# Patient Record
Sex: Female | Born: 1955 | Race: White | Hispanic: No | Marital: Married | Smoking: Never smoker
Health system: Southern US, Community
[De-identification: ages and names within clinical notes are randomized; demographics above are authoritative.]

## PROBLEM LIST (undated history)

## (undated) DIAGNOSIS — R079 Chest pain, unspecified: Secondary | ICD-10-CM

## (undated) DIAGNOSIS — J45909 Unspecified asthma, uncomplicated: Secondary | ICD-10-CM

## (undated) DIAGNOSIS — J189 Pneumonia, unspecified organism: Secondary | ICD-10-CM

## (undated) DIAGNOSIS — M1990 Unspecified osteoarthritis, unspecified site: Secondary | ICD-10-CM

## (undated) DIAGNOSIS — K219 Gastro-esophageal reflux disease without esophagitis: Secondary | ICD-10-CM

## (undated) DIAGNOSIS — B019 Varicella without complication: Secondary | ICD-10-CM

## (undated) DIAGNOSIS — T7840XA Allergy, unspecified, initial encounter: Secondary | ICD-10-CM

## (undated) DIAGNOSIS — K5792 Diverticulitis of intestine, part unspecified, without perforation or abscess without bleeding: Secondary | ICD-10-CM

## (undated) DIAGNOSIS — T8859XA Other complications of anesthesia, initial encounter: Secondary | ICD-10-CM

## (undated) DIAGNOSIS — F419 Anxiety disorder, unspecified: Secondary | ICD-10-CM

## (undated) HISTORY — DX: Unspecified asthma, uncomplicated: J45.909

## (undated) HISTORY — DX: Unspecified osteoarthritis, unspecified site: M19.90

## (undated) HISTORY — PX: UTERINE FIBROID SURGERY: SHX826

## (undated) HISTORY — PX: LAPAROSCOPY: SHX197

## (undated) HISTORY — DX: Varicella without complication: B01.9

## (undated) HISTORY — DX: Allergy, unspecified, initial encounter: T78.40XA

## (undated) HISTORY — DX: Diverticulitis of intestine, part unspecified, without perforation or abscess without bleeding: K57.92

---

## 1998-02-11 HISTORY — PX: ANTERIOR CRUCIATE LIGAMENT REPAIR: SHX115

## 2002-02-11 HISTORY — PX: ABDOMINAL HYSTERECTOMY: SHX81

## 2007-02-12 DIAGNOSIS — K5792 Diverticulitis of intestine, part unspecified, without perforation or abscess without bleeding: Secondary | ICD-10-CM

## 2007-02-12 HISTORY — DX: Diverticulitis of intestine, part unspecified, without perforation or abscess without bleeding: K57.92

## 2013-07-08 ENCOUNTER — Ambulatory Visit: Payer: Self-pay | Admitting: Internal Medicine

## 2013-09-22 ENCOUNTER — Ambulatory Visit: Payer: Self-pay

## 2014-05-20 LAB — HM PAP SMEAR

## 2014-08-01 ENCOUNTER — Other Ambulatory Visit: Payer: Self-pay | Admitting: Family Medicine

## 2014-08-02 ENCOUNTER — Other Ambulatory Visit: Payer: Self-pay | Admitting: Family Medicine

## 2014-08-02 MED ORDER — ALBUTEROL SULFATE HFA 108 (90 BASE) MCG/ACT IN AERS
2.0000 | INHALATION_SPRAY | RESPIRATORY_TRACT | Status: DC | PRN
Start: 2014-08-02 — End: 2015-03-13

## 2014-08-02 NOTE — Telephone Encounter (Signed)
Pt called stated she needs a refill on Ventolin. [facility] [address] In [place].  Pt is in the pharmacy now and is going out of town today. Needs ASAP. Thanks.

## 2014-08-02 NOTE — Telephone Encounter (Signed)
Routing to provider  

## 2014-08-02 NOTE — Telephone Encounter (Signed)
I just got this message; called patient, left msg that I did not know she was at the pharmacy earlier today waiting on this I will send in to pharmacy; if she has already gone out of town, should be [facility]ferr[facility] any other [facility] In the future, please give [place] 48 hours to allow time to process requests so she isn't inconvenienced

## 2014-11-15 ENCOUNTER — Ambulatory Visit (INDEPENDENT_AMBULATORY_CARE_PROVIDER_SITE_OTHER): Payer: BLUE CROSS/BLUE SHIELD | Admitting: Family Medicine

## 2014-11-15 ENCOUNTER — Encounter: Payer: Self-pay | Admitting: Family Medicine

## 2014-11-15 VITALS — BP 134/88 | HR 72 | Temp 98.0°F | Ht 65.0 in | Wt 161.6 lb

## 2014-11-15 DIAGNOSIS — H6982 Other specified disorders of Eustachian tube, left ear: Secondary | ICD-10-CM

## 2014-11-15 DIAGNOSIS — R030 Elevated blood-pressure reading, without diagnosis of hypertension: Secondary | ICD-10-CM

## 2014-11-15 DIAGNOSIS — IMO0001 Reserved for inherently not codable concepts without codable children: Secondary | ICD-10-CM

## 2014-11-15 DIAGNOSIS — H9202 Otalgia, left ear: Secondary | ICD-10-CM

## 2014-11-15 NOTE — Patient Instructions (Addendum)
Okay to use OTC Afrin Spray then lay then roll as we discussed Twice a day for THREE days only We'll refer you to the ENT doctor Okay to use tylenol or acetaminophen for pain if needed (use per package directions) Do contact your dentist about any problems related to your teeth Try the DASH guidelines  DASH Eating Plan DASH stands for "Dietary Approaches to Stop Hypertension." The DASH eating plan is a healthy eating plan that has been shown to reduce high blood pressure (hypertension). Additional health benefits may include reducing the risk of type 2 diabetes mellitus, heart disease, and stroke. The DASH eating plan may also help with weight loss. WHAT DO I NEED TO KNOW ABOUT THE DASH EATING PLAN? For the DASH eating plan, you will follow these general guidelines:  Choose foods with a percent daily value for sodium of less than 5% (as listed on the food label).  Use salt-free seasonings or herbs instead of table salt or sea salt.  Check with your health care provider or pharmacist before using salt substitutes.  Eat lower-sodium products, often labeled as "lower sodium" or "no salt added."  Eat fresh foods.  Eat more vegetables, fruits, and low-fat dairy products.  Choose whole grains. Look for the word "whole" as the first word in the ingredient list.  Choose fish and skinless chicken or [place] more often than red meat. Limit fish, poultry, and meat to 6 oz (170 g) each day.  Limit sweets, desserts, sugars, and sugary drinks.  Choose heart-healthy fats.  Limit cheese to 1 oz (28 g) per day.  Eat more home-cooked food and less restaurant, buffet, and fast food.  Limit fried foods.  Cook foods using methods other than frying.  Limit canned vegetables. If you do use them, rinse them well to decrease the sodium.  When eating at a restaurant, ask that your food be prepared with less salt, or no salt if possible. WHAT FOODS CAN I EAT? Seek help from a dietitian for  individual calorie needs. Grains Whole grain or whole wheat bread. Brown rice. Whole grain or whole wheat pasta. Quinoa, bulgur, and whole grain cereals. Low-sodium cereals. Corn or whole wheat flour tortillas. Whole grain cornbread. Whole grain crackers. Low-sodium crackers. Vegetables Fresh or frozen vegetables (raw, steamed, roasted, or grilled). Low-sodium or reduced-sodium tomato and vegetable juices. Low-sodium or reduced-sodium tomato sauce and paste. Low-sodium or reduced-sodium canned vegetables.  Fruits All fresh, canned (in natural juice), or frozen fruits. Meat and Other Protein Products Ground beef (85% or leaner), grass-fed beef, or beef trimmed of fat. Skinless chicken or [place]. Ground chicken or [place]. Pork trimmed of fat. All fish and seafood. Eggs. Dried beans, peas, or lentils. Unsalted nuts and seeds. Unsalted canned beans. Dairy Low-fat dairy products, such as skim or 1% milk, 2% or reduced-fat cheeses, low-fat ricotta or cottage cheese, or plain low-fat yogurt. Low-sodium or reduced-sodium cheeses. Fats and Oils Tub margarines without trans fats. Light or reduced-fat mayonnaise and salad dressings (reduced sodium). Avocado. Safflower, olive, or canola oils. Natural peanut or almond butter. Other Unsalted popcorn and pretzels. The items listed above may not be a complete list of recommended foods or beverages. Contact your dietitian for more options. WHAT FOODS ARE NOT RECOMMENDED? Grains White bread. White pasta. White rice. Refined cornbread. Bagels and croissants. Crackers that contain trans fat. Vegetables Creamed or fried vegetables. Vegetables in a cheese sauce. Regular canned vegetables. Regular canned tomato sauce and paste. Regular tomato and vegetable juices. Fruits Dried fruits.  Canned fruit in light or heavy syrup. Fruit juice. Meat and Other Protein Products Fatty cuts of meat. Ribs, chicken wings, bacon, sausage, bologna, salami, chitterlings, fatback, hot  dogs, bratwurst, and packaged luncheon meats. Salted nuts and seeds. Canned beans with salt. Dairy Whole or 2% milk, cream, half-and-half, and cream cheese. Whole-fat or sweetened yogurt. Full-fat cheeses or blue cheese. Nondairy creamers and whipped toppings. Processed cheese, cheese spreads, or cheese curds. Condiments Onion and garlic salt, seasoned salt, table salt, and sea salt. Canned and packaged gravies. Worcestershire sauce. Tartar sauce. Barbecue sauce. Teriyaki sauce. Soy sauce, including reduced sodium. Steak sauce. Fish sauce. Oyster sauce. Cocktail sauce. Horseradish. Ketchup and mustard. Meat flavorings and tenderizers. Bouillon cubes. Hot sauce. Tabasco sauce. Marinades. Taco seasonings. Relishes. Fats and Oils Butter, stick margarine, lard, shortening, ghee, and bacon fat. Coconut, palm kernel, or palm oils. Regular salad dressings. Other Pickles and olives. Salted popcorn and pretzels. The items listed above may not be a complete list of foods and beverages to avoid. Contact your dietitian for more information. WHERE CAN I FIND MORE INFORMATION? National Heart, Lung, and Blood Institute: travelstabloid.com Document Released: 01/17/2011 Document Revised: 06/14/2013 Document Reviewed: 12/02/2012 [facility] Patient Information 2015 [place], [place]. This information is not intended to replace advice given to you by your health care provider. Make sure you discuss any questions you have with your health care provider.

## 2014-11-15 NOTE — Progress Notes (Signed)
BP 134/88 mmHg  Pulse 72  Temp(Src) 98 F (36.7 C)  Ht 5\' 5"  (1.651 m)  Wt 161 lb 9.6 oz (73.301 kg)  BMI 26.89 kg/m2  SpO2 99%   Subjective:    Patient ID: Lynn Peters, female    DOB: Sep 16, 1955, 59 y.o.   MRN: [id number]  HPI: Lynn Peters is a 59 y.o. female  Chief Complaint  Patient presents with  . Ear Pain    Left Ear   She is here for an acute visit for left ear and left sided jaw discomfort; goes down into the left side of the neck No sore throat, dry cough She was on airplanes 10 days ago Since then, feeling pressure in the left ear Feels a little bit like it needs to pop; feels a tiny bit numb Always this left ear; she puts plugs in the ears and take a weak Benadryl, but get excruciating pain in the left ear; almost always just the left Going on for years Checked her temperature yesterday and it was normal Having pain with chewing; jaw feels sore; no clicking in the left ear; no popping in the left ear Having a little bit of headache; thinks form lack of caffeine though; gets headaches if she misses her coffee Has not tried anything; then said she boiled some water and poured it in a towel and having that heat against the side of the ear and neck felt really good Little bit of nasal drainage and that might be causing the cough Asthma has not been any worse thankfully  She is not interested in a flu shot today; previous bad reaction, almost always gets the flu   Relevant past medical, surgical, family and social history reviewed and updated as indicated. Interim medical history since our last visit reviewed. Allergies and medications reviewed and updated.  Review of Systems  Constitutional: Positive for fatigue. Negative for fever.  HENT: Positive for dental problem (posterior molars, two have root canals; dentist is Dr. Thalia Bloodgood), sneezing (when she sneezes, she sneezes multiple times) and voice change (a little hoarse this morning). Negative for facial swelling,  hearing loss, mouth sores, nosebleeds and trouble swallowing.   Skin: Negative for rash.  Psychiatric/Behavioral: Positive for sleep disturbance.   Per HPI unless specifically indicated above     Objective:    BP 134/88 mmHg  Pulse 72  Temp(Src) 98 F (36.7 C)  Ht 5\' 5"  (1.651 m)  Wt 161 lb 9.6 oz (73.301 kg)  BMI 26.89 kg/m2  SpO2 99%  Wt Readings from Last 3 Encounters:  11/15/14 161 lb 9.6 oz (73.301 kg)  05/23/14 157 lb (71.215 kg)   BP was checked by CMA after pushing tight sleeve up  Physical Exam  Constitutional: She appears well-developed and well-nourished.  HENT:  Right Ear: Hearing, tympanic membrane, external ear and ear canal normal. No drainage or swelling. Tympanic membrane is not injected, not scarred and not erythematous. No middle ear effusion. No hemotympanum.  Left Ear: Hearing, tympanic membrane, external ear and ear canal normal. No drainage or swelling. Tympanic membrane is not injected, not scarred and not erythematous.  No middle ear effusion. No hemotympanum.  Nose: No mucosal edema or rhinorrhea.  Mouth/Throat: Oropharynx is clear and moist and mucous membranes are normal.  Eyes: EOM are normal. No scleral icterus.  Cardiovascular: Normal rate and regular rhythm.   Pulmonary/Chest: Effort normal and breath sounds normal.  Lymphadenopathy:       Head (right side): No submental,  no submandibular, no preauricular and no posterior auricular adenopathy present.       Head (left side): No submental, no submandibular, no preauricular and no posterior auricular adenopathy present.    She has no cervical adenopathy.  Psychiatric: She has a normal mood and affect. Her behavior is normal.    Results for orders placed or performed in visit on 11/15/14  HM PAP SMEAR  Result Value Ref Range   HM Pap smear from [facility]       Assessment & Plan:   Problem List Items Addressed This Visit      Nervous and Auditory   Chronic dysfunction of left Eustachian tube -  Primary    Symptoms possibly from ETD; discussed trial of Afrin for 3 days and 3 days only; explained risk of dependence if used longer than 3 days; demonstrated spray-lay-roll to affected side        Other   Otalgia of left ear    Chronic, refer to ENT for evaluation; ddx includes ETD, TMJ; I did not see any signs to suggest dental/oral cause for symptoms      Relevant Orders   Ambulatory referral to ENT   Elevated blood pressure    Encouraged the DASH guidelines         Follow up plan: Return if symptoms worsen or fail to improve. An after-visit summary was printed and given to the patient at [facility].  Please see the patient instructions which may contain other information and recommendations beyond what is mentioned above in the assessment and plan.  Orders Placed This Encounter  Procedures  . Ambulatory referral to ENT

## 2014-11-19 DIAGNOSIS — R030 Elevated blood-pressure reading, without diagnosis of hypertension: Secondary | ICD-10-CM

## 2014-11-19 DIAGNOSIS — IMO0001 Reserved for inherently not codable concepts without codable children: Secondary | ICD-10-CM | POA: Insufficient documentation

## 2014-11-19 NOTE — Assessment & Plan Note (Addendum)
Chronic, refer to ENT for evaluation; ddx includes ETD, TMJ; I did not see any signs to suggest dental/oral cause for symptoms

## 2014-11-19 NOTE — Assessment & Plan Note (Signed)
Symptoms possibly from ETD; discussed trial of Afrin for 3 days and 3 days only; explained risk of dependence if used longer than 3 days; demonstrated spray-lay-roll to affected side

## 2014-11-19 NOTE — Assessment & Plan Note (Signed)
Encouraged the DASH guidelines

## 2015-02-08 ENCOUNTER — Encounter: Payer: Self-pay | Admitting: Nurse Practitioner

## 2015-02-08 ENCOUNTER — Encounter (INDEPENDENT_AMBULATORY_CARE_PROVIDER_SITE_OTHER): Payer: Self-pay

## 2015-02-08 ENCOUNTER — Ambulatory Visit (INDEPENDENT_AMBULATORY_CARE_PROVIDER_SITE_OTHER): Payer: BLUE CROSS/BLUE SHIELD | Admitting: Nurse Practitioner

## 2015-02-08 VITALS — BP 116/78 | HR 76 | Temp 97.4°F | Resp 12 | Ht 65.75 in | Wt 161.2 lb

## 2015-02-08 DIAGNOSIS — Z7189 Other specified counseling: Secondary | ICD-10-CM | POA: Diagnosis not present

## 2015-02-08 DIAGNOSIS — Z1329 Encounter for screening for other suspected endocrine disorder: Secondary | ICD-10-CM

## 2015-02-08 DIAGNOSIS — M25561 Pain in right knee: Secondary | ICD-10-CM | POA: Diagnosis not present

## 2015-02-08 DIAGNOSIS — Z833 Family history of diabetes mellitus: Secondary | ICD-10-CM

## 2015-02-08 DIAGNOSIS — Z7689 Persons encountering health services in other specified circumstances: Secondary | ICD-10-CM

## 2015-02-08 LAB — HEMOGLOBIN A1C: HEMOGLOBIN A1C: 5.6 % (ref 4.6–6.5)

## 2015-02-08 LAB — TSH: TSH: 2.62 u[IU]/mL (ref 0.35–4.50)

## 2015-02-08 LAB — T4, FREE: FREE T4: 1.01 ng/dL (ref 0.60–1.60)

## 2015-02-08 NOTE — Progress Notes (Signed)
Patient ID: Lynn Peters, female    DOB: [birth year]  Age: 59 y.o. MRN: [id number]  CC: Establish Care   HPI Lynn Peters presents for establishing care and chief complaint of diabetes concern.  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   2) Chronic Problems-  Asthma- Stopped singulair 1 year ago    3) Acute Problems-   Concerned about diabetes- no fasting blood tests, Fam. Hx extensive of diabetes.   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   She has past surgical history that includes Anterior cruciate ligament repair (Right, 2000) and Abdominal hysterectomy (2004).   Her family history includes Alcohol abuse in her brother; Alzheimer's disease in her mother; Asthma in her mother and sister; Cancer in her sister; Diabetes in her father, mother, and sister; Heart disease in her brother and father; Hyperlipidemia in her father and mother; Hypertension in her mother; Stroke in her mother.She reports that she has never smoked. She has never used smokeless tobacco. She reports that she drinks alcohol. She reports that she does not use illicit drugs.  Outpatient Prescriptions Prior to Visit  Medication Sig Dispense Refill  . AEROSPAN 80 MCG/ACT AERS USE 4 PUFFS TWICE A DAY AND RINSE MOUTH OUT AFTER USE AS DIRECTED 8.9 Inhaler 3  . albuterol (PROVENTIL HFA;VENTOLIN HFA) 108 (90 BASE) MCG/ACT inhaler Inhale 2 puffs  into the lungs every 4 (four) hours as needed for wheezing or shortness of breath. 1 Inhaler 1  . montelukast (SINGULAIR) 10 MG tablet Take 10 mg by mouth at bedtime. Reported on 02/08/2015     No facility-administered medications prior to visit.    ROS Review of Systems  Constitutional: Positive for fatigue. Negative for fever, chills and diaphoresis.  Eyes: Negative for visual disturbance.  Respiratory: Negative for cough, chest tightness and wheezing.   Gastrointestinal: Negative for nausea, vomiting, diarrhea and constipation.  Endocrine: Negative for cold intolerance, heat intolerance, polydipsia, polyphagia and polyuria.  Musculoskeletal: Positive for arthralgias. Negative for myalgias.  Skin: Negative for rash.  Psychiatric/Behavioral: Negative for suicidal ideas and sleep disturbance. The patient is not nervous/anxious.     Objective:  BP 116/78 mmHg  Pulse 76  Temp(Src) 97.4 F (36.3 C) (Oral)  Resp 12  Ht 5' 5.75" (1.67 m)  Wt 161 lb 4 oz (73.143 kg)  BMI 26.23 kg/m2  SpO2 99%  Physical Exam  Constitutional: She is oriented to person, place, and time. She appears well-developed and well-nourished. No distress.  HENT:  Head: Normocephalic and atraumatic.  Right Ear: External ear normal.  Left Ear: External ear normal.  Cardiovascular: Normal rate, regular rhythm and normal heart sounds.  Exam reveals no gallop and no friction rub.   No murmur heard. Pulmonary/Chest: Effort normal and breath sounds normal. No respiratory distress. She has no wheezes. She has no rales. She exhibits no tenderness.  Neurological: She is alert  and oriented to person, place, and time. No cranial nerve deficit. She exhibits normal muscle tone. Coordination normal.  Skin: Skin is warm and dry. No rash noted. She is not diaphoretic.  Psychiatric: She has a normal mood and affect. Her behavior is normal. Judgment and thought content normal.   Assessment & Plan:   Lynn Peters was seen today for  establish care.  Diagnoses and all orders for this visit:  Family history of diabetes mellitus -     HgB A1c  Screening for thyroid disorder -     TSH -     T4, free  Encounter to establish care  Right knee pain  I am having Lynn Peters maintain her AEROSPAN, albuterol, and montelukast.  No orders of the defined types were placed in this encounter.     Follow-up: Return in about 2 weeks (around 02/22/2015) for Follow up for concerns- EKG at this visit.

## 2015-02-08 NOTE — Patient Instructions (Signed)
Nice to meet you! Welcome to [facility]!   Please visit the lab today and follow up in 2 weeks.

## 2015-02-08 NOTE — Progress Notes (Signed)
Pre-visit discussion using our clinic review tool. No additional management support is needed unless otherwise documented below in the visit note.  

## 2015-02-10 ENCOUNTER — Telehealth: Payer: Self-pay | Admitting: Nurse Practitioner

## 2015-02-10 NOTE — Telephone Encounter (Signed)
Pt called wanting to get her lab results. Please advise pt °

## 2015-02-10 NOTE — Telephone Encounter (Signed)
Please advise on results. Thanks.

## 2015-02-16 DIAGNOSIS — M25561 Pain in right knee: Secondary | ICD-10-CM | POA: Insufficient documentation

## 2015-02-16 DIAGNOSIS — Z833 Family history of diabetes mellitus: Secondary | ICD-10-CM | POA: Insufficient documentation

## 2015-02-16 DIAGNOSIS — Z7689 Persons encountering health services in other specified circumstances: Secondary | ICD-10-CM | POA: Insufficient documentation

## 2015-02-16 DIAGNOSIS — Z1329 Encounter for screening for other suspected endocrine disorder: Secondary | ICD-10-CM | POA: Insufficient documentation

## 2015-02-16 NOTE — Assessment & Plan Note (Signed)
Other family history of diabetes she would like to check her status today. Discussed how the A1c works in the ranges for what it means. Will follow after results

## 2015-02-16 NOTE — Assessment & Plan Note (Signed)
Discussed in 2 weeks right knee feels painful after working out at the gym

## 2015-02-16 NOTE — Assessment & Plan Note (Signed)
Discussed acute and chronic issues. Reviewed health maintenance measures, PFSHx, and immunizations. Obtain records from previous facility and some labs today.  Pt wants work up of cardiac concerns next visit- we will do fasting labs and EKG since she is over 50 to have a baseline and look at cholesterol.

## 2015-02-16 NOTE — Assessment & Plan Note (Signed)
Screening for thyroid disease. Patient reports she is tired most of the time.

## 2015-02-22 ENCOUNTER — Ambulatory Visit (INDEPENDENT_AMBULATORY_CARE_PROVIDER_SITE_OTHER): Payer: BLUE CROSS/BLUE SHIELD | Admitting: Nurse Practitioner

## 2015-02-22 ENCOUNTER — Encounter: Payer: Self-pay | Admitting: Nurse Practitioner

## 2015-02-22 VITALS — BP 118/70 | HR 88 | Temp 97.9°F | Resp 16 | Ht 65.7 in | Wt 163.8 lb

## 2015-02-22 DIAGNOSIS — Z0000 Encounter for general adult medical examination without abnormal findings: Secondary | ICD-10-CM

## 2015-02-22 NOTE — Patient Instructions (Addendum)
Please make an appointment for your labs.  Normal EKG at this time. Will repeat if you have any symptoms.

## 2015-02-22 NOTE — Progress Notes (Signed)
Patient ID: Lynn Peters, female    DOB: 1956-01-05  Age: 60 y.o. MRN: [id number]  CC: Annual Exam   HPI [name] presents for her annual exam.   1) Annual Physical   Diet- Tries to eat healthy   Exercise- 3 times a week   Immunizations-    Flu- UTD  Mammogram- UTD  Colonoscopy- UTD  Eye Exam- UTD  Dental Exam- UTD  LMP- Hysterectomy   Labs- Needs  Fall- Neg.   Depression- Neg.  Refills: No needs today  Needs EKG baseline  Not fasting    History Phala has a past medical history of Arthritis; Allergy; Asthma; Chicken pox; and Diverticulitis (2009).   She has past surgical history that includes Anterior cruciate ligament repair (Right, 2000) and Abdominal hysterectomy (2004).   Her family history includes Alcohol abuse in her brother; Alzheimer's disease in her mother; Asthma in her mother and sister; Cancer in her sister; Diabetes in her father, mother, and sister; Heart disease in her brother and father; Hyperlipidemia in her father and mother; Hypertension in her mother; Stroke in her mother.She reports that she has never smoked. She has never used smokeless tobacco. She reports that she drinks alcohol. She reports that she does not use illicit drugs.  Outpatient Prescriptions Prior to Visit  Medication Sig Dispense Refill  . AEROSPAN 80 MCG/ACT AERS USE 4 PUFFS TWICE A DAY AND RINSE MOUTH OUT AFTER USE AS DIRECTED 8.9 Inhaler 3  . albuterol (PROVENTIL HFA;VENTOLIN HFA) 108 (90 BASE) MCG/ACT inhaler Inhale 2 puffs into the lungs every 4 (four) hours as needed for wheezing or shortness of breath. 1 Inhaler 1  . montelukast (SINGULAIR) 10 MG tablet Take 10 mg by mouth at bedtime. Reported on 02/22/2015     No facility-administered medications prior to visit.    ROS Review of Systems  Constitutional: Negative for fever, chills, diaphoresis, fatigue and unexpected weight change.  HENT: Negative for tinnitus and trouble swallowing.   Eyes: Negative for visual disturbance.   Respiratory: Negative for chest tightness, shortness of breath and wheezing.   Cardiovascular: Negative for chest pain, palpitations and leg swelling.  Gastrointestinal: Negative for nausea, vomiting, abdominal pain, diarrhea, constipation, blood in stool and rectal pain.  Endocrine: Negative for polydipsia, polyphagia and polyuria.  Genitourinary: Negative for dysuria, hematuria, vaginal discharge and vaginal pain.  Musculoskeletal: Negative for myalgias, back pain, arthralgias and gait problem.  Skin: Negative for color change and rash.  Neurological: Negative for dizziness, weakness, numbness and headaches.  Hematological: Does not bruise/bleed easily.  Psychiatric/Behavioral: Negative for suicidal ideas and sleep disturbance. The patient is not nervous/anxious.     Objective:  BP 118/70 mmHg  Pulse 88  Temp(Src) 97.9 F (36.6 C) (Oral)  Resp 16  Ht 5' 5.7" (1.669 m)  Wt 163 lb 12.8 oz (74.299 kg)  BMI 26.67 kg/m2  SpO2 97%  Physical Exam  Constitutional: She is oriented to person, place, and time. She appears well-developed and well-nourished. No distress.  HENT:  Head: Normocephalic and atraumatic.  Right Ear: External ear normal.  Left Ear: External ear normal.  Nose: Nose normal.  Mouth/Throat: Oropharynx is clear and moist. No oropharyngeal exudate.  TMs and canals clear bilaterally  Eyes: Conjunctivae and EOM are normal. Pupils are equal, round, and reactive to light. Right eye exhibits no discharge. Left eye exhibits no discharge. No scleral icterus.  Neck: Normal range of motion. Neck supple. No thyromegaly present.  Cardiovascular: Normal rate, regular rhythm, normal heart sounds and  intact distal pulses.  Exam reveals no gallop and no friction rub.   No murmur heard. Pulmonary/Chest: Effort normal and breath sounds normal. No respiratory distress. She has no wheezes. She has no rales. She exhibits no tenderness.  No significant findings on clinical breast exam   Abdominal: Soft. Bowel sounds are normal. She exhibits no distension and no mass. There is no tenderness. There is no rebound and no guarding.  Genitourinary:  Declined today  Musculoskeletal: Normal range of motion. She exhibits no edema or tenderness.  Lymphadenopathy:    She has no cervical adenopathy.  Neurological: She is alert and oriented to person, place, and time. She has normal reflexes. No cranial nerve deficit. She exhibits normal muscle tone. Coordination normal.  Skin: Skin is warm and dry. No rash noted. She is not diaphoretic. No erythema. No pallor.  Psychiatric: She has a normal mood and affect. Her behavior is normal. Judgment and thought content normal.      Assessment & Plan:   Deannah was seen today for annual exam.  Diagnoses and all orders for this visit:  Preventative health care -     EKG 12-Lead -     Lipid Profile; Future -     CBC w/Diff; Future -     Comp Met (CMET); Future -     Vitamin D (25 hydroxy); Future   I have discontinued Ms. Rabel's montelukast. I am also having her maintain her AEROSPAN and albuterol.  No orders of the defined types were placed in this encounter.     Follow-up: Return for Lab visit for fasting labs.

## 2015-02-28 DIAGNOSIS — Z1211 Encounter for screening for malignant neoplasm of colon: Secondary | ICD-10-CM | POA: Insufficient documentation

## 2015-02-28 DIAGNOSIS — Z0000 Encounter for general adult medical examination without abnormal findings: Secondary | ICD-10-CM | POA: Insufficient documentation

## 2015-02-28 NOTE — Assessment & Plan Note (Signed)
Discussed acute and chronic issues. Reviewed health maintenance measures, PFSHx, and immunizations. Obtain routine labs TSH, Vitamin D CBC w/ diff, A1c, and CMET.   EKG baseline obtained today

## 2015-03-13 ENCOUNTER — Other Ambulatory Visit: Payer: Self-pay

## 2015-03-13 ENCOUNTER — Telehealth: Payer: Self-pay | Admitting: *Deleted

## 2015-03-13 MED ORDER — FLUNISOLIDE HFA 80 MCG/ACT IN AERS: INHALATION_SPRAY | RESPIRATORY_TRACT | Status: DC

## 2015-03-13 MED ORDER — ALBUTEROL SULFATE HFA 108 (90 BASE) MCG/ACT IN AERS: 2.0000 | INHALATION_SPRAY | RESPIRATORY_TRACT | Status: DC | PRN

## 2015-03-13 NOTE — Telephone Encounter (Signed)
Patient will need a medication refill for AEROSPAN, and albuterol.  [facility]  [id number] Contact [phone]

## 2015-03-13 NOTE — Telephone Encounter (Signed)
filled

## 2015-03-30 ENCOUNTER — Other Ambulatory Visit (INDEPENDENT_AMBULATORY_CARE_PROVIDER_SITE_OTHER): Payer: BLUE CROSS/BLUE SHIELD

## 2015-03-30 DIAGNOSIS — Z0000 Encounter for general adult medical examination without abnormal findings: Secondary | ICD-10-CM

## 2015-03-30 LAB — COMPREHENSIVE METABOLIC PANEL
ALT: 22 U/L (ref 0–35)
AST: 19 U/L (ref 0–37)
Albumin: 4.5 g/dL (ref 3.5–5.2)
Alkaline Phosphatase: 69 U/L (ref 39–117)
BUN: 12 mg/dL (ref 6–23)
CHLORIDE: 103 meq/L (ref 96–112)
CO2: 30 meq/L (ref 19–32)
Calcium: 10.1 mg/dL (ref 8.4–10.5)
Creatinine, Ser: 0.83 mg/dL (ref 0.40–1.20)
GFR: 74.52 mL/min (ref >60.00)
GLUCOSE: 99 mg/dL (ref 70–99)
POTASSIUM: 4.1 meq/L (ref 3.5–5.1)
SODIUM: 140 meq/L (ref 135–145)
TOTAL PROTEIN: 7.6 g/dL (ref 6.0–8.3)
Total Bilirubin: 0.6 mg/dL (ref 0.2–1.2)

## 2015-03-30 LAB — CBC WITH DIFFERENTIAL/PLATELET
BASOS PCT: 0.5 % (ref 0.0–3.0)
Basophils Absolute: 0 10*3/uL (ref 0.0–0.1)
EOS PCT: 1.9 % (ref 0.0–5.0)
Eosinophils Absolute: 0.1 10*3/uL (ref 0.0–0.7)
HCT: 47.4 % — ABNORMAL HIGH (ref 36.0–46.0)
Hemoglobin: 16 g/dL — ABNORMAL HIGH (ref 12.0–15.0)
LYMPHS ABS: 2.4 10*3/uL (ref 0.7–4.0)
Lymphocytes Relative: 32.4 % (ref 12.0–46.0)
MCHC: 33.7 g/dL (ref 30.0–36.0)
MCV: 87.8 fl (ref 78.0–100.0)
MONO ABS: 0.5 10*3/uL (ref 0.1–1.0)
MONOS PCT: 6.4 % (ref 3.0–12.0)
NEUTROS ABS: 4.4 10*3/uL (ref 1.4–7.7)
NEUTROS PCT: 58.8 % (ref 43.0–77.0)
PLATELETS: 319 10*3/uL (ref 150.0–400.0)
RBC: 5.4 Mil/uL — AB (ref 3.87–5.11)
RDW: 13.2 % (ref 11.5–15.5)
WBC: 7.5 10*3/uL (ref 4.0–10.5)

## 2015-03-30 LAB — LIPID PANEL
Cholesterol: 240 mg/dL — ABNORMAL HIGH (ref 0–200)
HDL: 65 mg/dL (ref >39.00)
LDL Cholesterol: 149 mg/dL — ABNORMAL HIGH (ref 0–99)
NONHDL: 175.43
TRIGLYCERIDES: 132 mg/dL (ref 0.0–149.0)
Total CHOL/HDL Ratio: 4
VLDL: 26.4 mg/dL (ref 0.0–40.0)

## 2015-03-30 LAB — VITAMIN D 25 HYDROXY (VIT D DEFICIENCY, FRACTURES): VITD: 38.09 ng/mL (ref 30.00–100.00)

## 2015-04-03 ENCOUNTER — Encounter: Payer: Self-pay | Admitting: Nurse Practitioner

## 2015-05-26 ENCOUNTER — Other Ambulatory Visit: Payer: Self-pay | Admitting: Nurse Practitioner

## 2015-10-04 ENCOUNTER — Telehealth: Payer: Self-pay

## 2015-10-04 ENCOUNTER — Ambulatory Visit (INDEPENDENT_AMBULATORY_CARE_PROVIDER_SITE_OTHER): Payer: BLUE CROSS/BLUE SHIELD | Admitting: Family

## 2015-10-04 ENCOUNTER — Encounter: Payer: Self-pay | Admitting: Family

## 2015-10-04 VITALS — BP 110/60 | HR 84 | Temp 97.7°F | Resp 16 | Ht 65.5 in | Wt 167.0 lb

## 2015-10-04 DIAGNOSIS — R140 Abdominal distension (gaseous): Secondary | ICD-10-CM

## 2015-10-04 DIAGNOSIS — Z7189 Other specified counseling: Secondary | ICD-10-CM

## 2015-10-04 DIAGNOSIS — J45909 Unspecified asthma, uncomplicated: Secondary | ICD-10-CM

## 2015-10-04 DIAGNOSIS — R87619 Unspecified abnormal cytological findings in specimens from cervix uteri: Secondary | ICD-10-CM

## 2015-10-04 DIAGNOSIS — J4530 Mild persistent asthma, uncomplicated: Secondary | ICD-10-CM | POA: Insufficient documentation

## 2015-10-04 DIAGNOSIS — Z7689 Persons encountering health services in other specified circumstances: Secondary | ICD-10-CM

## 2015-10-04 MED ORDER — FLUNISOLIDE HFA 80 MCG/ACT IN AERS: INHALATION_SPRAY | RESPIRATORY_TRACT | 3 refills | Status: DC

## 2015-10-04 MED ORDER — ALBUTEROL SULFATE HFA 108 (90 BASE) MCG/ACT IN AERS: INHALATION_SPRAY | RESPIRATORY_TRACT | 2 refills | Status: DC

## 2015-10-04 NOTE — Assessment & Plan Note (Signed)
Will repeat pap at f/u

## 2015-10-04 NOTE — Assessment & Plan Note (Signed)
Occasional abdominal bloating. No acute symptoms. Pending CT abdomen and pelvis. Patient politely declined CA 125 lab test.

## 2015-10-04 NOTE — Patient Instructions (Signed)
I enjoyed meeting you today. I look forward to seeing you at your physical.  Referrals placed. We will call you once we scheduled CT  Abdomen.

## 2015-10-04 NOTE — Telephone Encounter (Signed)
Reordered CT

## 2015-10-04 NOTE — Progress Notes (Signed)
Subjective:    Patient ID: Lynn Peters, female    DOB: 1955/10/22, 60 y.o.   MRN: [id number]  CC: Lynn Peters is a 60 y.o. female who presents today for follow up.   HPI: Patient here for follow-up for chronic disease and to establish care.  She had a physical January of this year.She is also requesting referrals- to allergist and orthopedic.   Asthma- worse with allergies since moving from [place]. Believes wheat and corn are allergic for her.Improves with paddleboating and Zerorunner.   Right knee pain- h/o ACL ligament replacement. Intermittent sharp pain. Unable to run. No giving way, falls, injury.   Some concerns about her Pap smear. She is also due for mammogram.   Pap-positive HPV in 2016; partial hysterectomy with ovaries and cervix per patient. Unable to see Pap smear results or colonoscopy in Epic.  Occasional Abdominal bloating- Has gained some weight which is likely contributing; however concerned about ovarian cancer. Has had CA-125 in the past. Desires CT and lab work. No abdominal pain.   Weight- She is also having trouble losing weight, thinks knee is contributing.    HISTORY:  Past Medical History:  Diagnosis Date  . Allergy   . Arthritis   . Asthma   . Chicken pox   . Diverticulitis 2009   Past Surgical History:  Procedure Laterality Date  . ABDOMINAL HYSTERECTOMY  2004   partial; left cervix and ovaries  . ANTERIOR CRUCIATE LIGAMENT REPAIR Right 2000   Family History  Problem Relation Age of Onset  . Alzheimer's disease Mother   . Asthma Mother   . Diabetes Mother   . Hyperlipidemia Mother   . Hypertension Mother   . Stroke Mother   . Heart disease Father     CHF  . Diabetes Father   . Hyperlipidemia Father   . Alcohol abuse Brother   . Heart disease Brother   . Asthma Sister   . Cancer Sister     uterine  . Diabetes Sister     Allergies: Review of patient's allergies indicates no known allergies. No current outpatient prescriptions on file  prior to visit.   No current facility-administered medications on file prior to visit.     Social History  Substance Use Topics  . Smoking status: Never Smoker  . Smokeless tobacco: Never Used  . Alcohol use 0.0 oz/week    Review of Systems  Constitutional: Negative for chills, fever and unexpected weight change.  HENT: Negative for congestion.   Respiratory: Negative for cough, shortness of breath and wheezing.   Cardiovascular: Negative for chest pain, palpitations and leg swelling.  Gastrointestinal: Positive for abdominal distention. Negative for abdominal pain, blood in stool, nausea and vomiting.  Musculoskeletal: Negative for arthralgias, joint swelling and myalgias.  Skin: Negative for rash.  Neurological: Negative for headaches.  Hematological: Negative for adenopathy.  Psychiatric/Behavioral: Negative for confusion.      Objective:    BP 110/60 (BP Location: Left Arm, Patient Position: Sitting, Cuff Size: Large)   Pulse 84   Temp 97.7 F (36.5 C) (Oral)   Resp 16   Ht 5' 5.5" (1.664 m)   Wt 167 lb (75.8 kg)   SpO2 97%   BMI 27.37 kg/m  BP Readings from Last 3 Encounters:  10/04/15 110/60  02/22/15 118/70  02/08/15 116/78   Wt Readings from Last 3 Encounters:  10/04/15 167 lb (75.8 kg)  02/22/15 163 lb 12.8 oz (74.3 kg)  02/08/15 161 lb 4 oz (73.1  kg)    Physical Exam  Constitutional: She appears well-developed and well-nourished.  Eyes: Conjunctivae are normal.  Cardiovascular: Normal rate, regular rhythm, normal heart sounds and normal pulses.   Pulmonary/Chest: Effort normal and breath sounds normal. She has no wheezes. She has no rhonchi. She has no rales.  Neurological: She is alert.  Skin: Skin is warm and dry.  Psychiatric: She has a normal mood and affect. Her speech is normal and behavior is normal. Thought content normal.  Vitals reviewed.      Assessment & Plan:   Problem List Items Addressed This Visit      Respiratory   Asthma     Worsening with allergies. No acute symptoms today. Referral to allergist as requested. Refilled medications.       Relevant Medications   albuterol (VENTOLIN HFA) 108 (90 Base) MCG/ACT inhaler   Flunisolide HFA (AEROSPAN) 80 MCG/ACT AERS     Other   Encounter to establish care - Primary   Relevant Orders   Ambulatory referral to Orthopedic Surgery   Ambulatory referral to Allergy   MM DIGITAL SCREENING BILATERAL   Abnormal Pap smear of cervix    Will repeat pap at f/u      Abdominal bloating    Occasional abdominal bloating. No acute symptoms. Pending CT abdomen and pelvis. Patient politely declined CA 125 lab test.      Relevant Orders   CT ABDOMEN PELVIS W WO CONTRAST    Other Visit Diagnoses   None.      I have changed Ms. Langham's VENTOLIN HFA to albuterol. I am also having her maintain her Flunisolide HFA.   Meds ordered this encounter  Medications  . albuterol (VENTOLIN HFA) 108 (90 Base) MCG/ACT inhaler    Sig: INHALE 2 PUFFS INTO THE LUNGS EVERY 4 (FOUR) HOURS AS NEEDED FOR WHEEZING OR SHORTNESS OF BREATH.    Dispense:  18 Inhaler    Refill:  2    Order Specific Question:   Supervising Provider    Answer:   Deborra Medina L [[id number]]  . Flunisolide HFA (AEROSPAN) 80 MCG/ACT AERS    Sig: USE 4 PUFFS TWICE A DAY AND RINSE MOUTH OUT AFTER USE AS DIRECTED    Dispense:  8.9 Inhaler    Refill:  3    Order Specific Question:   Supervising Provider    Answer:   Crecencio Mc [[id number]]    Return precautions given.   Risks, benefits, and alternatives of the medications and treatment plan prescribed today were discussed, and patient expressed understanding.   Education regarding symptom management and diagnosis given to patient on AVS.  Continue to follow with Mable Paris, FNP for routine health maintenance.   Lavena Bullion and I agreed with plan.   Mable Paris, FNP

## 2015-10-04 NOTE — Assessment & Plan Note (Signed)
Worsening with allergies. No acute symptoms today. Referral to allergist as requested. Refilled medications.

## 2015-10-05 ENCOUNTER — Telehealth: Payer: Self-pay | Admitting: *Deleted

## 2015-10-05 ENCOUNTER — Ambulatory Visit
Admission: RE | Admit: 2015-10-05 | Discharge: 2015-10-05 | Disposition: A | Discharge status: Home / Self Care | Payer: BLUE CROSS/BLUE SHIELD | Source: Ambulatory Visit | Attending: Family | Admitting: Family

## 2015-10-05 DIAGNOSIS — R140 Abdominal distension (gaseous): Secondary | ICD-10-CM

## 2015-10-05 LAB — POCT I-STAT CREATININE: Creatinine, Ser: 0.8 mg/dL (ref 0.44–1.00)

## 2015-10-05 MED ORDER — IOPAMIDOL (ISOVUE-300) INJECTION 61%
100.0000 mL | Freq: Once | INTRAVENOUS | Status: AC | PRN
  Administered 2015-10-05: 100 mL via INTRAVENOUS

## 2015-10-05 NOTE — Telephone Encounter (Signed)
-----   Message from Burnard Hawthorne, [place] sent at 10/05/2015 12:15 PM EDT ----- Please let patient know that recent tests were normal - I sent to Mychart as well.   If unable to reach patient after 3 attempts, please let me know.   NOTE: If MyChart activated, I have also released them to MyChart.   Thanks  :)

## 2015-10-05 NOTE — Telephone Encounter (Signed)
Patient advised as below.  

## 2015-10-05 NOTE — Telephone Encounter (Signed)
Patient has requested her CT scan results  Pt contact [phone]

## 2015-10-05 NOTE — Telephone Encounter (Signed)
Patient has been informed that results have not been interpreted by Mable Paris as of yet. Will call patient when they have been.

## 2015-10-06 ENCOUNTER — Encounter: Payer: Self-pay | Admitting: Family

## 2015-10-10 ENCOUNTER — Encounter: Payer: Self-pay | Admitting: Family

## 2015-10-10 DIAGNOSIS — M25561 Pain in right knee: Secondary | ICD-10-CM

## 2015-10-26 ENCOUNTER — Ambulatory Visit
Admission: RE | Admit: 2015-10-26 | Discharge: 2015-10-26 | Disposition: A | Discharge status: Home / Self Care | Payer: BLUE CROSS/BLUE SHIELD | Source: Ambulatory Visit | Attending: Family | Admitting: Family

## 2015-10-26 ENCOUNTER — Telehealth: Payer: Self-pay | Admitting: *Deleted

## 2015-10-26 ENCOUNTER — Other Ambulatory Visit: Payer: Self-pay | Admitting: Family

## 2015-10-26 DIAGNOSIS — Z1231 Encounter for screening mammogram for malignant neoplasm of breast: Secondary | ICD-10-CM | POA: Diagnosis not present

## 2015-10-26 DIAGNOSIS — Z7689 Persons encountering health services in other specified circumstances: Secondary | ICD-10-CM

## 2015-10-26 NOTE — Telephone Encounter (Signed)
Sent you a result note.

## 2015-10-26 NOTE — Telephone Encounter (Signed)
Patient has requested to have her mammogram results as soon as soon as results are available  Pt contact [phone]

## 2015-10-26 NOTE — Telephone Encounter (Signed)
Test was done today, no result in chart, please notify patient when they are there thanks  Sent to [facility] to hold for results when available, thanks

## 2015-10-26 NOTE — Telephone Encounter (Signed)
Patients results have return back incomplete.  Needed more imaging.Please advise.

## 2015-10-27 ENCOUNTER — Ambulatory Visit
Admission: RE | Admit: 2015-10-27 | Discharge: 2015-10-27 | Disposition: A | Discharge status: Home / Self Care | Payer: BLUE CROSS/BLUE SHIELD | Source: Ambulatory Visit | Attending: Family | Admitting: Family

## 2015-10-27 ENCOUNTER — Other Ambulatory Visit: Payer: Self-pay | Admitting: Family

## 2015-10-27 DIAGNOSIS — N6489 Other specified disorders of breast: Secondary | ICD-10-CM | POA: Diagnosis present

## 2015-10-27 DIAGNOSIS — N63 Unspecified lump in breast: Secondary | ICD-10-CM | POA: Diagnosis not present

## 2015-10-28 ENCOUNTER — Encounter: Payer: Self-pay | Admitting: Family

## 2015-10-28 DIAGNOSIS — N6320 Unspecified lump in the left breast, unspecified quadrant: Secondary | ICD-10-CM | POA: Insufficient documentation

## 2015-12-04 DIAGNOSIS — M25561 Pain in right knee: Secondary | ICD-10-CM | POA: Diagnosis not present

## 2015-12-04 DIAGNOSIS — M2341 Loose body in knee, right knee: Secondary | ICD-10-CM | POA: Diagnosis not present

## 2015-12-04 DIAGNOSIS — M1711 Unilateral primary osteoarthritis, right knee: Secondary | ICD-10-CM | POA: Diagnosis not present

## 2015-12-05 ENCOUNTER — Other Ambulatory Visit: Payer: Self-pay | Admitting: Orthopedic Surgery

## 2015-12-05 DIAGNOSIS — M25561 Pain in right knee: Secondary | ICD-10-CM

## 2015-12-05 DIAGNOSIS — M2341 Loose body in knee, right knee: Secondary | ICD-10-CM

## 2015-12-05 DIAGNOSIS — J3089 Other allergic rhinitis: Secondary | ICD-10-CM | POA: Diagnosis not present

## 2015-12-05 DIAGNOSIS — J4540 Moderate persistent asthma, uncomplicated: Secondary | ICD-10-CM | POA: Diagnosis not present

## 2015-12-05 DIAGNOSIS — J301 Allergic rhinitis due to pollen: Secondary | ICD-10-CM | POA: Diagnosis not present

## 2015-12-05 DIAGNOSIS — J3081 Allergic rhinitis due to animal (cat) (dog) hair and dander: Secondary | ICD-10-CM | POA: Diagnosis not present

## 2015-12-08 ENCOUNTER — Ambulatory Visit
Admission: RE | Admit: 2015-12-08 | Discharge: 2015-12-08 | Disposition: A | Discharge status: Home / Self Care | Payer: BLUE CROSS/BLUE SHIELD | Source: Ambulatory Visit | Attending: Orthopedic Surgery | Admitting: Orthopedic Surgery

## 2015-12-08 DIAGNOSIS — M25561 Pain in right knee: Secondary | ICD-10-CM

## 2015-12-08 DIAGNOSIS — M2341 Loose body in knee, right knee: Secondary | ICD-10-CM | POA: Insufficient documentation

## 2015-12-08 DIAGNOSIS — M19071 Primary osteoarthritis, right ankle and foot: Secondary | ICD-10-CM | POA: Diagnosis not present

## 2016-01-08 ENCOUNTER — Ambulatory Visit (INDEPENDENT_AMBULATORY_CARE_PROVIDER_SITE_OTHER): Payer: BLUE CROSS/BLUE SHIELD | Admitting: Family

## 2016-01-08 ENCOUNTER — Encounter: Payer: Self-pay | Admitting: Family

## 2016-01-08 ENCOUNTER — Other Ambulatory Visit (HOSPITAL_COMMUNITY)
Admission: RE | Admit: 2016-01-08 | Discharge: 2016-01-08 | Disposition: A | Discharge status: Home / Self Care | Payer: BLUE CROSS/BLUE SHIELD | Source: Ambulatory Visit | Attending: Family | Admitting: Family

## 2016-01-08 VITALS — BP 118/78 | HR 74 | Temp 97.5°F | Ht 65.0 in | Wt 169.2 lb

## 2016-01-08 DIAGNOSIS — R87619 Unspecified abnormal cytological findings in specimens from cervix uteri: Secondary | ICD-10-CM | POA: Diagnosis not present

## 2016-01-08 DIAGNOSIS — J45909 Unspecified asthma, uncomplicated: Secondary | ICD-10-CM

## 2016-01-08 DIAGNOSIS — Z0000 Encounter for general adult medical examination without abnormal findings: Secondary | ICD-10-CM | POA: Diagnosis not present

## 2016-01-08 DIAGNOSIS — N6320 Unspecified lump in the left breast, unspecified quadrant: Secondary | ICD-10-CM | POA: Diagnosis not present

## 2016-01-08 DIAGNOSIS — Z01419 Encounter for gynecological examination (general) (routine) without abnormal findings: Secondary | ICD-10-CM | POA: Insufficient documentation

## 2016-01-08 DIAGNOSIS — Z1151 Encounter for screening for human papillomavirus (HPV): Secondary | ICD-10-CM | POA: Insufficient documentation

## 2016-01-08 DIAGNOSIS — Z113 Encounter for screening for infections with a predominantly sexual mode of transmission: Secondary | ICD-10-CM | POA: Diagnosis not present

## 2016-01-08 LAB — LIPID PANEL
CHOLESTEROL: 255 mg/dL — AB (ref 0–200)
HDL: 69.8 mg/dL (ref >39.00)
LDL CALC: 160 mg/dL — AB (ref 0–99)
NonHDL: 185.2
TRIGLYCERIDES: 126 mg/dL (ref 0.0–149.0)
Total CHOL/HDL Ratio: 4
VLDL: 25.2 mg/dL (ref 0.0–40.0)

## 2016-01-08 NOTE — Patient Instructions (Addendum)
You are doing great Labs Mammogram repeat 03/2016.  Health Maintenance, Female Introduction Adopting a healthy lifestyle and getting preventive care can go a long way to promote health and wellness. Talk with your health care provider about what schedule of regular examinations is right for you. This is a good chance for you to check in with your provider about disease prevention and staying healthy. In between checkups, there are plenty of things you can do on your own. Experts have done a lot of research about which lifestyle changes and preventive measures are most likely to keep you healthy. Ask your health care provider for more information. Weight and diet Eat a healthy diet  Be sure to include plenty of vegetables, fruits, low-fat dairy products, and lean protein.  Do not eat a lot of foods high in solid fats, added sugars, or salt.  Get regular exercise. This is one of the most important things you can do for your health.  Most adults should exercise for at least 150 minutes each week. The exercise should increase your heart rate and make you sweat (moderate-intensity exercise).  Most adults should also do strengthening exercises at least twice a week. This is in addition to the moderate-intensity exercise. Maintain a healthy weight  Body mass index (BMI) is a measurement that can be used to identify possible weight problems. It estimates body fat based on height and weight. Your health care provider can help determine your BMI and help you achieve or maintain a healthy weight.  For females 60 years of age and older:  A BMI below 18.5 is considered underweight.  A BMI of 18.5 to 24.9 is normal.  A BMI of 25 to 29.9 is considered overweight.  A BMI of 30 and above is considered obese. Watch levels of cholesterol and blood lipids  You should start having your blood tested for lipids and cholesterol at 60 years of age, then have this test every 5 years.  You may need to have  your cholesterol levels checked more often if:  Your lipid or cholesterol levels are high.  You are older than 60 years of age.  You are at high risk for heart disease. Cancer screening Lung Cancer  Lung cancer screening is recommended for adults 50-37 years old who are at high risk for lung cancer because of a history of smoking.  A yearly low-dose CT scan of the lungs is recommended for people who:  Currently smoke.  Have quit within the past 15 years.  Have at least a 30-pack-year history of smoking. A pack year is smoking an average of one pack of cigarettes a day for 1 year.  Yearly screening should continue until it has been 15 years since you quit.  Yearly screening should stop if you develop a health problem that would prevent you from having lung cancer treatment. Breast Cancer  Practice breast self-awareness. This means understanding how your breasts normally appear and feel.  It also means doing regular breast self-exams. Let your health care provider know about any changes, no matter how small.  If you are in your 20s or 30s, you should have a clinical breast exam (CBE) by a health care provider every 1-3 years as part of a regular health exam.  If you are 8 or older, have a CBE every year. Also consider having a breast X-ray (mammogram) every year.  If you have a family history of breast cancer, talk to your health care provider about genetic screening.  If you are at high risk for breast cancer, talk to your health care provider about having an MRI and a mammogram every year.  Breast cancer gene (BRCA) assessment is recommended for women who have family members with BRCA-related cancers. BRCA-related cancers include:  Breast.  Ovarian.  Tubal.  Peritoneal cancers.  Results of the assessment will determine the need for genetic counseling and BRCA1 and BRCA2 testing. Cervical Cancer  Your health care provider may recommend that you be screened regularly  for cancer of the pelvic organs (ovaries, uterus, and vagina). This screening involves a pelvic examination, including checking for microscopic changes to the surface of your cervix (Pap test). You may be encouraged to have this screening done every 3 years, beginning at age 32.  For women ages 45-65, health care providers may recommend pelvic exams and Pap testing every 3 years, or they may recommend the Pap and pelvic exam, combined with testing for human papilloma virus (HPV), every 5 years. Some types of HPV increase your risk of cervical cancer. Testing for HPV may also be done on women of any age with unclear Pap test results.  Other health care providers may not recommend any screening for nonpregnant women who are considered low risk for pelvic cancer and who do not have symptoms. Ask your health care provider if a screening pelvic exam is right for you.  If you have had past treatment for cervical cancer or a condition that could lead to cancer, you need Pap tests and screening for cancer for at least 20 years after your treatment. If Pap tests have been discontinued, your risk factors (such as having a new sexual partner) need to be reassessed to determine if screening should resume. Some women have medical problems that increase the chance of getting cervical cancer. In these cases, your health care provider may recommend more frequent screening and Pap tests. Colorectal Cancer  This type of cancer can be detected and often prevented.  Routine colorectal cancer screening usually begins at 60 years of age and continues through 60 years of age.  Your health care provider may recommend screening at an earlier age if you have risk factors for colon cancer.  Your health care provider may also recommend using home test kits to check for hidden blood in the stool.  A small camera at the end of a tube can be used to examine your colon directly (sigmoidoscopy or colonoscopy). This is done to check  for the earliest forms of colorectal cancer.  Routine screening usually begins at age 52.  Direct examination of the colon should be repeated every 5-10 years through 60 years of age. However, you may need to be screened more often if early forms of precancerous polyps or small growths are found. Skin Cancer  Check your skin from head to toe regularly.  Tell your health care provider about any new moles or changes in moles, especially if there is a change in a mole's shape or color.  Also tell your health care provider if you have a mole that is larger than the size of a pencil eraser.  Always use sunscreen. Apply sunscreen liberally and repeatedly throughout the day.  Protect yourself by wearing long sleeves, pants, a wide-brimmed hat, and sunglasses whenever you are outside. Heart disease, diabetes, and high blood pressure  High blood pressure causes heart disease and increases the risk of stroke. High blood pressure is more likely to develop in:  People who have blood pressure in the  high end of the normal range (130-139/85-89 mm Hg).  People who are overweight or obese.  People who are African American.  If you are 18-39 years of age, have your blood pressure checked every 3-5 years. If you are 40 years of age or older, have your blood pressure checked every year. You should have your blood pressure measured twice-once when you are at a hospital or clinic, and once when you are not at a hospital or clinic. Record the average of the two measurements. To check your blood pressure when you are not at a hospital or clinic, you can use:  An automated blood pressure machine at a pharmacy.  A home blood pressure monitor.  If you are between 55 years and 79 years old, ask your health care provider if you should take aspirin to prevent strokes.  Have regular diabetes screenings. This involves taking a blood sample to check your fasting blood sugar level.  If you are at a normal weight  and have a low risk for diabetes, have this test once every three years after 60 years of age.  If you are overweight and have a high risk for diabetes, consider being tested at a younger age or more often. Preventing infection Hepatitis B  If you have a higher risk for hepatitis B, you should be screened for this virus. You are considered at high risk for hepatitis B if:  You were born in a country where hepatitis B is common. Ask your health care provider which countries are considered high risk.  Your parents were born in a high-risk country, and you have not been immunized against hepatitis B (hepatitis B vaccine).  You have HIV or AIDS.  You use needles to inject street drugs.  You live with someone who has hepatitis B.  You have had sex with someone who has hepatitis B.  You get hemodialysis treatment.  You take certain medicines for conditions, including cancer, organ transplantation, and autoimmune conditions. Hepatitis C  Blood testing is recommended for:  Everyone born from 1945 through 1965.  Anyone with known risk factors for hepatitis C. Sexually transmitted infections (STIs)  You should be screened for sexually transmitted infections (STIs) including gonorrhea and chlamydia if:  You are sexually active and are younger than 60 years of age.  You are older than 60 years of age and your health care provider tells you that you are at risk for this type of infection.  Your sexual activity has changed since you were last screened and you are at an increased risk for chlamydia or gonorrhea. Ask your health care provider if you are at risk.  If you do not have HIV, but are at risk, it may be recommended that you take a prescription medicine daily to prevent HIV infection. This is called pre-exposure prophylaxis (PrEP). You are considered at risk if:  You are sexually active and do not regularly use condoms or know the HIV status of your partner(s).  You take drugs by  injection.  You are sexually active with a partner who has HIV. Talk with your health care provider about whether you are at high risk of being infected with HIV. If you choose to begin PrEP, you should first be tested for HIV. You should then be tested every 3 months for as long as you are taking PrEP. Pregnancy  If you are premenopausal and you may become pregnant, ask your health care provider about preconception counseling.  If you may become   pregnant, take 400 to 800 micrograms (mcg) of folic acid every day.  If you want to prevent pregnancy, talk to your health care provider about birth control (contraception). Osteoporosis and menopause  Osteoporosis is a disease in which the bones lose minerals and strength with aging. This can result in serious bone fractures. Your risk for osteoporosis can be identified using a bone density scan.  If you are 26 years of age or older, or if you are at risk for osteoporosis and fractures, ask your health care provider if you should be screened.  Ask your health care provider whether you should take a calcium or vitamin D supplement to lower your risk for osteoporosis.  Menopause may have certain physical symptoms and risks.  Hormone replacement therapy may reduce some of these symptoms and risks. Talk to your health care provider about whether hormone replacement therapy is right for you. Follow these instructions at home:  Schedule regular health, dental, and eye exams.  Stay current with your immunizations.  Do not use any tobacco products including cigarettes, chewing tobacco, or electronic cigarettes.  If you are pregnant, do not drink alcohol.  If you are breastfeeding, limit how much and how often you drink alcohol.  Limit alcohol intake to no more than 1 drink per day for nonpregnant women. One drink equals 12 ounces of beer, 5 ounces of wine, or 1 ounces of hard liquor.  Do not use street drugs.  Do not share needles.  Ask  your health care provider for help if you need support or information about quitting drugs.  Tell your health care provider if you often feel depressed.  Tell your health care provider if you have ever been abused or do not feel safe at home. This information is not intended to replace advice given to you by your health care provider. Make sure you discuss any questions you have with your health care provider. Document Released: 08/13/2010 Document Revised: 07/06/2015 Document Reviewed: 11/01/2014  2017 [employer]

## 2016-01-08 NOTE — Progress Notes (Signed)
Pre visit review using our clinic review tool, if applicable. No additional management support is needed unless otherwise documented below in the visit note. 

## 2016-01-08 NOTE — Assessment & Plan Note (Signed)
Continue to follow with allergy. Symptomatically stable today. We'll follow.

## 2016-01-08 NOTE — Assessment & Plan Note (Addendum)
Pending Lipid panel, hep C screen.

## 2016-01-08 NOTE — Progress Notes (Signed)
Subjective:    Patient ID: Lynn Peters, female    DOB: 06/05/1955, 60 y.o.   MRN: [id number]  CC: Lynn Peters is a 60 y.o. female who presents today for physical exam.    HPI: Here for preventative care, Pap smear, and  follow up.   CPE done 02/2015 and here today for Pap since h./o abnormal.   Asthma- follows with allergy. On QVar. Had been on Brio but had heart palpitations and stopped medication. No SOB, wheezing, nocturnal coughing.      Colorectal Cancer Screening:  Unable to see record. Believes 2009 and wants to hold off to 2019. No polpys per patient.  Breast Cancer Screening: Mammogram UTD; likely benign left breast mass; advised 6 month f/u.  Cervical Cancer Screening: H/o hysterectomy. Had presence of HPV.  Bone Health screening/DEXA for 65+: No increased fracture risk. Defer screening at this time. Lung Cancer Screening: Doesn't have 30 year pack year history and age > 46 years.  Immunizations       Tetanus - Due; advised to get at local pharmacy Hepatitis C screening - Candidate for.   Labs: Screening labs today. Exercise: Gets regular exercise.  Alcohol use: Occasional.  Smoking/tobacco use: Nonsmoker.  Regular dental exams: UTD Wears seat belt: Yes. Eye: UTD Skin:   HISTORY:  Past Medical History:  Diagnosis Date  . Allergy   . Arthritis   . Asthma   . Chicken pox   . Diverticulitis 2009    Past Surgical History:  Procedure Laterality Date  . ABDOMINAL HYSTERECTOMY  2004   partial; left cervix and ovaries  . ANTERIOR CRUCIATE LIGAMENT REPAIR Right 2000   Family History  Problem Relation Age of Onset  . Alzheimer's disease Mother   . Asthma Mother   . Diabetes Mother   . Hyperlipidemia Mother   . Hypertension Mother   . Stroke Mother   . Heart disease Father     CHF  . Diabetes Father   . Hyperlipidemia Father   . Alcohol abuse Brother   . Heart disease Brother   . Asthma Sister   . Cancer Sister     uterine  . Diabetes Sister   .  Breast cancer Neg Hx       ALLERGIES: Patient has no known allergies.  Current Outpatient Prescriptions on File Prior to Visit  Medication Sig Dispense Refill  . albuterol (VENTOLIN HFA) 108 (90 Base) MCG/ACT inhaler INHALE 2 PUFFS INTO THE LUNGS EVERY 4 (FOUR) HOURS AS NEEDED FOR WHEEZING OR SHORTNESS OF BREATH. 18 Inhaler 2   No current facility-administered medications on file prior to visit.     Social History  Substance Use Topics  . Smoking status: Never Smoker  . Smokeless tobacco: Never Used  . Alcohol use 0.0 oz/week    Review of Systems  Constitutional: Negative for chills, fever and unexpected weight change.  HENT: Negative for congestion.   Respiratory: Negative for cough.   Cardiovascular: Negative for chest pain, palpitations and leg swelling.  Gastrointestinal: Negative for nausea and vomiting.  Musculoskeletal: Negative for myalgias.  Skin: Negative for rash.  Neurological: Negative for headaches.  Hematological: Negative for adenopathy.  Psychiatric/Behavioral: Negative for confusion.      Objective:    BP 118/78   Pulse 74   Temp 97.5 F (36.4 C) (Oral)   Ht 5\' 5"  (1.651 m)   Wt 169 lb 3.2 oz (76.7 kg)   SpO2 98%   BMI 28.16 kg/m   BP Readings  from Last 3 Encounters:  01/08/16 118/78  10/04/15 110/60  02/22/15 118/70   Wt Readings from Last 3 Encounters:  01/08/16 169 lb 3.2 oz (76.7 kg)  10/04/15 167 lb (75.8 kg)  02/22/15 163 lb 12.8 oz (74.3 kg)    Physical Exam  Constitutional: She appears well-developed and well-nourished.  Eyes: Conjunctivae are normal.  Neck: No thyroid mass and no thyromegaly present.  Cardiovascular: Normal rate, regular rhythm, normal heart sounds and normal pulses.   Pulmonary/Chest: Effort normal and breath sounds normal. She has no wheezes. She has no rhonchi. She has no rales. Right breast exhibits no inverted nipple, no mass, no nipple discharge, no skin change and no tenderness. Left breast exhibits no  inverted nipple, no mass, no nipple discharge, no skin change and no tenderness. Breasts are symmetrical.  No masses or asymmetry appreciated during CBE.  Genitourinary: Uterus is not enlarged, not fixed and not tender. Cervix exhibits no motion tenderness, no discharge and no friability. Right adnexum displays no mass, no tenderness and no fullness. Left adnexum displays no mass, no tenderness and no fullness.  Genitourinary Comments: Pap performed. No CMT. Unable to appreciated ovaries.  Lymphadenopathy:       Head (right side): No submental, no submandibular, no tonsillar, no preauricular, no posterior auricular and no occipital adenopathy present.       Head (left side): No submental, no submandibular, no tonsillar, no preauricular, no posterior auricular and no occipital adenopathy present.       Right cervical: No superficial cervical, no deep cervical and no posterior cervical adenopathy present.      Left cervical: No superficial cervical, no deep cervical and no posterior cervical adenopathy present.    She has no axillary adenopathy.       Right axillary: No pectoral and no lateral adenopathy present.       Left axillary: No pectoral and no lateral adenopathy present. Neurological: She is alert.  Skin: Skin is warm and dry.  Psychiatric: She has a normal mood and affect. Her speech is normal and behavior is normal. Thought content normal.  Vitals reviewed.      Assessment & Plan:   Problem List Items Addressed This Visit      Respiratory   Asthma    Continue to follow with allergy. Symptomatically stable today. We'll follow.      Relevant Medications   montelukast (SINGULAIR) 10 MG tablet   QVAR 80 MCG/ACT inhaler     Other   Preventative health care    Pending Lipid panel, hep C screen.       Relevant Orders   Hepatitis C antibody   Lipid panel   Abnormal Pap smear of cervix    History of abnormal. Pap done today. Wet prep pending as well.       Relevant Orders     Cytology - PAP   Cervicovaginal ancillary only   Cervicovaginal ancillary only    Other Visit Diagnoses    Left breast mass    -  Primary   Relevant Orders   MM Digital Diagnostic Unilat L       I have discontinued Ms. Violette's Flunisolide HFA and BREO ELLIPTA. I am also having her maintain her albuterol, montelukast, and QVAR.   Meds ordered this encounter  Medications  . montelukast (SINGULAIR) 10 MG tablet  . DISCONTD: BREO ELLIPTA 100-25 MCG/INH AEPB  . QVAR 80 MCG/ACT inhaler    Return precautions given.   Risks, benefits, and alternatives  of the medications and treatment plan prescribed today were discussed, and patient expressed understanding.   Education regarding symptom management and diagnosis given to patient on AVS.   Continue to follow with Mable Paris, FNP for routine health maintenance.   Lavena Bullion and I agreed with plan.   Mable Paris, FNP

## 2016-01-08 NOTE — Assessment & Plan Note (Signed)
History of abnormal. Pap done today. Wet prep pending as well.

## 2016-01-09 LAB — CERVICOVAGINAL ANCILLARY ONLY: Wet Prep (BD Affirm): POSITIVE — AB

## 2016-01-09 LAB — HEPATITIS C ANTIBODY: HCV Ab: NEGATIVE

## 2016-01-10 LAB — CYTOLOGY - PAP
Diagnosis: NEGATIVE
HPV (WINDOPATH): NOT DETECTED

## 2016-01-11 ENCOUNTER — Other Ambulatory Visit: Payer: Self-pay | Admitting: Family

## 2016-01-11 ENCOUNTER — Telehealth: Payer: Self-pay | Admitting: Family

## 2016-01-11 ENCOUNTER — Encounter: Payer: Self-pay | Admitting: Family

## 2016-01-11 DIAGNOSIS — B9689 Other specified bacterial agents as the cause of diseases classified elsewhere: Secondary | ICD-10-CM

## 2016-01-11 DIAGNOSIS — N760 Acute vaginitis: Principal | ICD-10-CM

## 2016-01-11 MED ORDER — METRONIDAZOLE 500 MG PO TABS: 500.0000 mg | ORAL_TABLET | Freq: Two times a day (BID) | ORAL | 0 refills | Status: DC

## 2016-01-11 NOTE — Telephone Encounter (Signed)
Please advise 

## 2016-01-11 NOTE — Telephone Encounter (Signed)
Unfortunately I would not recommend that. Infection treated better with oral.  Not sure if they have gel

## 2016-01-11 NOTE — Telephone Encounter (Signed)
Pt called wanting to know if she can get a gel instead of a tablet for metroNIDAZOLE (FLAGYL) 500 MG tablet?  Please advise? Call pt @ [phone].  Pharmacy is [id number] - [place], [place]. Thank you!

## 2016-01-11 NOTE — Telephone Encounter (Signed)
Mychart message has been sent.  

## 2016-01-21 ENCOUNTER — Encounter: Payer: Self-pay | Admitting: Family

## 2016-01-22 ENCOUNTER — Telehealth: Payer: Self-pay | Admitting: Family

## 2016-01-22 ENCOUNTER — Other Ambulatory Visit: Payer: Self-pay | Admitting: Family

## 2016-01-22 DIAGNOSIS — B9689 Other specified bacterial agents as the cause of diseases classified elsewhere: Secondary | ICD-10-CM

## 2016-01-22 DIAGNOSIS — N760 Acute vaginitis: Principal | ICD-10-CM

## 2016-01-22 MED ORDER — METRONIDAZOLE 0.75 % VA GEL: 1.0000 | Freq: Every day | VAGINAL | 0 refills | Status: DC

## 2016-01-22 NOTE — Telephone Encounter (Signed)
See my chart message

## 2016-01-24 ENCOUNTER — Telehealth: Payer: Self-pay | Admitting: Family

## 2016-01-24 DIAGNOSIS — B9689 Other specified bacterial agents as the cause of diseases classified elsewhere: Secondary | ICD-10-CM

## 2016-01-24 DIAGNOSIS — N760 Acute vaginitis: Principal | ICD-10-CM

## 2016-01-24 MED ORDER — METRONIDAZOLE 0.75 % VA GEL: 1.0000 | Freq: Every day | VAGINAL | 0 refills | Status: AC

## 2016-01-24 NOTE — Telephone Encounter (Signed)
Patient has been notified

## 2016-01-24 NOTE — Telephone Encounter (Signed)
Call pt-  Are her symptoms not better after 5 days of metrogel?  Usually bacterial vaginosis responds in 5 days.Marland KitchenMarland Kitchen   I will refill and she can use for a couple more days however if she still has sxs, she needs OV

## 2016-01-24 NOTE — Telephone Encounter (Signed)
Left message for patient to return call.

## 2016-02-08 ENCOUNTER — Encounter: Payer: Self-pay | Admitting: Family

## 2016-02-13 ENCOUNTER — Other Ambulatory Visit (HOSPITAL_COMMUNITY)
Admission: RE | Admit: 2016-02-13 | Discharge: 2016-02-13 | Disposition: A | Discharge status: Home / Self Care | Payer: BLUE CROSS/BLUE SHIELD | Source: Ambulatory Visit | Attending: Family | Admitting: Family

## 2016-02-13 ENCOUNTER — Encounter: Payer: Self-pay | Admitting: Family

## 2016-02-13 ENCOUNTER — Ambulatory Visit (INDEPENDENT_AMBULATORY_CARE_PROVIDER_SITE_OTHER): Payer: BLUE CROSS/BLUE SHIELD | Admitting: Family

## 2016-02-13 ENCOUNTER — Other Ambulatory Visit: Payer: BLUE CROSS/BLUE SHIELD

## 2016-02-13 VITALS — BP 124/88 | HR 89 | Temp 97.7°F | Wt 171.8 lb

## 2016-02-13 DIAGNOSIS — R300 Dysuria: Secondary | ICD-10-CM | POA: Diagnosis not present

## 2016-02-13 DIAGNOSIS — R635 Abnormal weight gain: Secondary | ICD-10-CM | POA: Diagnosis not present

## 2016-02-13 DIAGNOSIS — J309 Allergic rhinitis, unspecified: Secondary | ICD-10-CM | POA: Diagnosis not present

## 2016-02-13 DIAGNOSIS — B9689 Other specified bacterial agents as the cause of diseases classified elsewhere: Secondary | ICD-10-CM | POA: Diagnosis not present

## 2016-02-13 LAB — TSH: TSH: 1.94 u[IU]/mL (ref 0.35–4.50)

## 2016-02-13 LAB — POCT URINALYSIS DIPSTICK
Bilirubin, UA: NEGATIVE
Blood, UA: NEGATIVE
Glucose, UA: NEGATIVE
Ketones, UA: NEGATIVE
Leukocytes, UA: NEGATIVE
Nitrite, UA: NEGATIVE
Protein, UA: NEGATIVE
Spec Grav, UA: 1.01
Urobilinogen, UA: 0.2
pH, UA: 6

## 2016-02-13 NOTE — Patient Instructions (Signed)
Suspect BV or vaginal atrophy Let's wait 1-2 days for cultures to return prior to treatment

## 2016-02-13 NOTE — Progress Notes (Signed)
Subjective:    Patient ID: Lynn Peters, female    DOB: [birth year], 61 y.o.   MRN: [id number]  CC: Lynn Peters is a 61 y.o. female who presents today for an acute visit.    HPI: CC: suprapubic pressure, cramping  Past 10 days, worsening.   Regular BM. No N, V. Tried acidophilus PO and eating yogurt. Tried azo.   Treated for BV with metrogel. Endorses pain AFTER urinating. Foul odor, thick white discharge. No vaginal dryness or itching. Notes 'thin skin'   Also noted clear nasal congestion, sore throat one day. Endorses fatigue.   Notes urinary incontinence with cough ,sneeze. Occasionally urgency.   Normal pap 12/2015.   Also complains of weight gain in the past couple years. Despite exercising couple times a week, and watching her diet. Has not had a recent thyroid check.     HISTORY:  Past Medical History:  Diagnosis Date  . Allergy   . Arthritis   . Asthma   . Chicken pox   . Diverticulitis 2009   Past Surgical History:  Procedure Laterality Date  . ABDOMINAL HYSTERECTOMY  2004   partial; left cervix and ovaries  . ANTERIOR CRUCIATE LIGAMENT REPAIR Right 2000   Family History  Problem Relation Age of Onset  . Alzheimer's disease Mother   . Asthma Mother   . Diabetes Mother   . Hyperlipidemia Mother   . Hypertension Mother   . Stroke Mother   . Heart disease Father     CHF  . Diabetes Father   . Hyperlipidemia Father   . Alcohol abuse Brother   . Heart disease Brother   . Asthma Sister   . Cancer Sister     uterine  . Diabetes Sister   . Breast cancer Neg Hx     Allergies: Flagyl [metronidazole] Current Outpatient Prescriptions on File Prior to Visit  Medication Sig Dispense Refill  . albuterol (VENTOLIN HFA) 108 (90 Base) MCG/ACT inhaler INHALE 2 PUFFS INTO THE LUNGS EVERY 4 (FOUR) HOURS AS NEEDED FOR WHEEZING OR SHORTNESS OF BREATH. 18 Inhaler 2  . montelukast (SINGULAIR) 10 MG tablet     . QVAR 80 MCG/ACT inhaler      No current  facility-administered medications on file prior to visit.     Social History  Substance Use Topics  . Smoking status: Never Smoker  . Smokeless tobacco: Never Used  . Alcohol use 0.0 oz/week    Review of Systems  Constitutional: Negative for chills and fever.  HENT: Positive for congestion, postnasal drip and sore throat.   Respiratory: Negative for cough.   Cardiovascular: Negative for chest pain and palpitations.  Gastrointestinal: Negative for abdominal distention, blood in stool, constipation, diarrhea, nausea and vomiting.  Genitourinary: Positive for dysuria, urgency and vaginal discharge. Negative for flank pain, frequency, hematuria, vaginal bleeding and vaginal pain.      Objective:    BP 124/88   Pulse 89   Temp 97.7 F (36.5 C) (Oral)   Wt 171 lb 12.8 oz (77.9 kg)   SpO2 98%   BMI 28.59 kg/m    Physical Exam  Constitutional: She appears well-developed and well-nourished.  HENT:  Head: Normocephalic and atraumatic.  Right Ear: Hearing, tympanic membrane, external ear and ear canal normal. No drainage, swelling or tenderness. No foreign bodies. Tympanic membrane is not erythematous and not bulging. No middle ear effusion. No decreased hearing is noted.  Left Ear: Hearing, tympanic membrane, external ear and ear canal normal. No drainage,  swelling or tenderness. No foreign bodies. Tympanic membrane is not erythematous and not bulging.  No middle ear effusion. No decreased hearing is noted.  Nose: Rhinorrhea present. Right sinus exhibits no maxillary sinus tenderness and no frontal sinus tenderness. Left sinus exhibits no maxillary sinus tenderness and no frontal sinus tenderness.  Mouth/Throat: Uvula is midline, oropharynx is clear and moist and mucous membranes are normal. No oropharyngeal exudate, posterior oropharyngeal edema, posterior oropharyngeal erythema or tonsillar abscesses.  Eyes: Conjunctivae are normal.  Cardiovascular: Normal rate, regular rhythm, normal  heart sounds and normal pulses.   Pulmonary/Chest: Effort normal and breath sounds normal. She has no wheezes. She has no rhonchi. She has no rales.  Abdominal: There is no tenderness. There is no rigidity, no guarding and no CVA tenderness.  Genitourinary: There is no rash, tenderness or lesion on the right labia. There is no rash, tenderness or lesion on the left labia. Cervix exhibits no motion tenderness and no discharge. Right adnexum displays no mass, no tenderness and no fullness. Left adnexum displays no mass, no tenderness and no fullness. No erythema, tenderness or bleeding in the vagina. No foreign body in the vagina. No vaginal discharge found.  Genitourinary Comments: No vulvovaginal erythema. No lesions. Diffuse thick white discharge, not purulent.   Lymphadenopathy:       Head (right side): No submental, no submandibular, no tonsillar, no preauricular, no posterior auricular and no occipital adenopathy present.       Head (left side): No submental, no submandibular, no tonsillar, no preauricular, no posterior auricular and no occipital adenopathy present.    She has no cervical adenopathy.  Neurological: She is alert.  Skin: Skin is warm and dry.  Psychiatric: She has a normal mood and affect. Her speech is normal and behavior is normal. Thought content normal.  Vitals reviewed.      Assessment & Plan:   1. Burning with urination Pending wet prep. CT abdomen 09/2015 normal. Suspect bacterial vaginosis due to thick discharge. Alternatively, considering vaginal atrophy. We already discussed trial of Premarin vaginally. We'll await culture, wet prep prior to treatment.   - POCT Urinalysis Dipstick - CULTURE, URINE COMPREHENSIVE - Cervicovaginal ancillary only  2. Weight gain Pending TSH. Advised to continue exercise, start food diary. - TSH  3. Acute allergic rhinitis, unspecified seasonality, unspecified trigger Afebrile. Trial flonase. Will let me know if not better.      I am having Ms. Crill maintain her albuterol, montelukast, QVAR, and Probiotic Product (SOLUBLE FIBER/PROBIOTICS PO).   Meds ordered this encounter  Medications  . Probiotic Product (SOLUBLE FIBER/PROBIOTICS PO)    Sig: Take by mouth.    Return precautions given.   Risks, benefits, and alternatives of the medications and treatment plan prescribed today were discussed, and patient expressed understanding.   Education regarding symptom management and diagnosis given to patient on AVS.  Continue to follow with Mable Paris, FNP for routine health maintenance.   Lavena Bullion and I agreed with plan.   Mable Paris, FNP

## 2016-02-13 NOTE — Progress Notes (Signed)
Pre visit review using our clinic review tool, if applicable. No additional management support is needed unless otherwise documented below in the visit note. 

## 2016-02-15 ENCOUNTER — Telehealth: Payer: Self-pay | Admitting: Family

## 2016-02-15 ENCOUNTER — Encounter: Payer: Self-pay | Admitting: Family

## 2016-02-15 DIAGNOSIS — B9689 Other specified bacterial agents as the cause of diseases classified elsewhere: Secondary | ICD-10-CM

## 2016-02-15 DIAGNOSIS — N760 Acute vaginitis: Principal | ICD-10-CM

## 2016-02-15 LAB — CERVICOVAGINAL ANCILLARY ONLY: Wet Prep (BD Affirm): POSITIVE — AB

## 2016-02-15 LAB — CULTURE, URINE COMPREHENSIVE: Organism ID, Bacteria: NO GROWTH

## 2016-02-15 MED ORDER — CLINDAMYCIN PHOSPHATE 2 % VA CREA: 1.0000 | TOPICAL_CREAM | Freq: Every day | VAGINAL | 0 refills | Status: AC

## 2016-02-15 NOTE — Telephone Encounter (Signed)
rx sent

## 2016-03-18 ENCOUNTER — Encounter: Payer: Self-pay | Admitting: Family

## 2016-03-20 ENCOUNTER — Emergency Department: Payer: BLUE CROSS/BLUE SHIELD

## 2016-03-20 ENCOUNTER — Ambulatory Visit: Payer: BLUE CROSS/BLUE SHIELD | Admitting: Family

## 2016-03-20 ENCOUNTER — Telehealth: Payer: Self-pay | Admitting: *Deleted

## 2016-03-20 ENCOUNTER — Emergency Department
Admission: EM | Admit: 2016-03-20 | Discharge: 2016-03-20 | Disposition: A | Discharge status: Home / Self Care | Payer: BLUE CROSS/BLUE SHIELD | Attending: Emergency Medicine | Admitting: Emergency Medicine

## 2016-03-20 ENCOUNTER — Encounter: Payer: Self-pay | Admitting: Emergency Medicine

## 2016-03-20 DIAGNOSIS — R05 Cough: Secondary | ICD-10-CM | POA: Diagnosis not present

## 2016-03-20 DIAGNOSIS — Z79899 Other long term (current) drug therapy: Secondary | ICD-10-CM | POA: Diagnosis not present

## 2016-03-20 DIAGNOSIS — R0600 Dyspnea, unspecified: Secondary | ICD-10-CM | POA: Diagnosis present

## 2016-03-20 DIAGNOSIS — J40 Bronchitis, not specified as acute or chronic: Secondary | ICD-10-CM | POA: Diagnosis not present

## 2016-03-20 DIAGNOSIS — J45909 Unspecified asthma, uncomplicated: Secondary | ICD-10-CM | POA: Diagnosis not present

## 2016-03-20 DIAGNOSIS — R0602 Shortness of breath: Secondary | ICD-10-CM | POA: Diagnosis not present

## 2016-03-20 DIAGNOSIS — R0789 Other chest pain: Secondary | ICD-10-CM | POA: Diagnosis not present

## 2016-03-20 MED ORDER — IPRATROPIUM-ALBUTEROL 0.5-2.5 (3) MG/3ML IN SOLN
3.0000 mL | Freq: Once | RESPIRATORY_TRACT | Status: AC
  Administered 2016-03-20: 3 mL via RESPIRATORY_TRACT
  Filled 2016-03-20: qty 3

## 2016-03-20 MED ORDER — PREDNISONE 20 MG PO TABS: 60.0000 mg | ORAL_TABLET | Freq: Every day | ORAL | 0 refills | Status: DC

## 2016-03-20 MED ORDER — PREDNISONE 20 MG PO TABS
60.0000 mg | ORAL_TABLET | Freq: Once | ORAL | Status: AC
  Administered 2016-03-20: 60 mg via ORAL
  Filled 2016-03-20: qty 3

## 2016-03-20 MED ORDER — HYDROCOD POLST-CPM POLST ER 10-8 MG/5ML PO SUER: 5.0000 mL | Freq: Two times a day (BID) | ORAL | 0 refills | Status: DC

## 2016-03-20 MED ORDER — SODIUM CHLORIDE 0.9 % IV BOLUS (SEPSIS)
1000.0000 mL | Freq: Once | INTRAVENOUS | Status: AC
  Administered 2016-03-20: 1000 mL via INTRAVENOUS

## 2016-03-20 MED ORDER — HYDROCOD POLST-CPM POLST ER 10-8 MG/5ML PO SUER
5.0000 mL | Freq: Once | ORAL | Status: AC
  Administered 2016-03-20: 5 mL via ORAL
  Filled 2016-03-20: qty 5

## 2016-03-20 NOTE — Telephone Encounter (Signed)
FYI Pt was seen in the emergency department([facility]) on 07/18/16 for acute respiratory distress. Pt was given breathing treatment for her asthma, and diagnosed with bronchitis .

## 2016-03-20 NOTE — Telephone Encounter (Signed)
FYI

## 2016-03-20 NOTE — ED Provider Notes (Signed)
[facility] Emergency Department Provider Note   ____________________________________________   First MD Initiated Contact with Patient 03/20/16 [phone]     (approximate)  I have reviewed the triage vital signs and the nursing notes.   HISTORY  Chief Complaint Respiratory Distress    HPI Lynn Peters is a 61 y.o. female who comes into the hospital today with some difficulty breathing. The patient has asthma and thinks she may have the flu. She's had some difficulty breathing since Thursday. She's been using her inhalers at home and was doing okay until tonight when she just felt she couldn't breathe. She reports that she was afraid to go to sleep because she felt like she would stop breathing. The patient reports that she is maxed out on using her albuterol inhaler. The patient reports that she couldn't tolerate the shortness of breath anymore. She's had a slight fever with a productive cough. She has chest tightness and her ribs hurt from coughing. The patient denies any sick contacts. The patient is here for evaluation.   Past Medical History:  Diagnosis Date  . Allergy   . Arthritis   . Asthma   . Chicken pox   . Diverticulitis 2009    Patient Active Problem List   Diagnosis Date Noted  . Mass of breast, left 10/28/2015  . Asthma 10/04/2015  . Abnormal Pap smear of cervix 10/04/2015  . Abdominal bloating 10/04/2015  . Preventative health care 02/28/2015  . Family history of diabetes mellitus 02/16/2015  . Right knee pain 02/16/2015  . Chronic dysfunction of left eustachian tube 11/15/2014    Past Surgical History:  Procedure Laterality Date  . ABDOMINAL HYSTERECTOMY  2004   partial; left cervix and ovaries  . ANTERIOR CRUCIATE LIGAMENT REPAIR Right 2000    Prior to Admission medications   Medication Sig Start Date End Date Taking? Authorizing Provider  albuterol (VENTOLIN HFA) 108 (90 Base) MCG/ACT inhaler INHALE 2 PUFFS INTO THE LUNGS  EVERY 4 (FOUR) HOURS AS NEEDED FOR WHEEZING OR SHORTNESS OF BREATH. 10/04/15  Yes Burnard Hawthorne, FNP  Cholecalciferol (VITAMIN D) 2000 units CAPS Take 1 capsule by mouth daily.   Yes Historical Provider, MD  FLOVENT HFA 110 MCG/ACT inhaler Inhale 2 puffs into the lungs 2 (two) times daily. 03/06/16  Yes Historical Provider, MD  montelukast (SINGULAIR) 10 MG tablet Take 10 mg by mouth at bedtime.  01/01/16  Yes Historical Provider, MD  Multiple Vitamins-Minerals (MULTIVITAMIN WOMEN 50+ PO) Take 1 tablet by mouth daily.   Yes Historical Provider, MD  Omega-3 Fatty Acids (FISH OIL) 1000 MG CAPS Take 1 capsule by mouth daily.   Yes Historical Provider, MD  Probiotic Product (SOLUBLE FIBER/PROBIOTICS PO) Take by mouth.   Yes Historical Provider, MD  TURMERIC PO Take 1 capsule by mouth daily.   Yes Historical Provider, MD  chlorpheniramine-HYDROcodone (TUSSIONEX PENNKINETIC ER) 10-8 MG/5ML SUER Take 5 mLs by mouth 2 (two) times daily. 03/20/16   Loney Hering, MD  predniSONE (DELTASONE) 20 MG tablet Take 3 tablets (60 mg total) by mouth daily. 03/20/16   Loney Hering, MD    Allergies Flagyl [metronidazole] and Breo ellipta [fluticasone furoate-vilanterol]  Family History  Problem Relation Age of Onset  . Alzheimer's disease Mother   . Asthma Mother   . Diabetes Mother   . Hyperlipidemia Mother   . Hypertension Mother   . Stroke Mother   . Heart disease Father     CHF  . Diabetes Father   .  Hyperlipidemia Father   . Alcohol abuse Brother   . Heart disease Brother   . Asthma Sister   . Cancer Sister     uterine  . Diabetes Sister   . Breast cancer Neg Hx     Social History Social History  Substance Use Topics  . Smoking status: Never Smoker  . Smokeless tobacco: Never Used  . Alcohol use 0.0 oz/week    Review of Systems Constitutional: Low-grade temperature Eyes: No visual changes. ENT: No sore throat. Cardiovascular: Chest tightness Respiratory: Cough and shortness of  breath. Gastrointestinal: No abdominal pain.  No nausea, no vomiting.  No diarrhea.  No constipation. Genitourinary: Negative for dysuria. Musculoskeletal: Negative for back pain. Skin: Negative for rash. Neurological: Negative for headaches, focal weakness or numbness.  10-point ROS otherwise negative.  ____________________________________________   PHYSICAL EXAM:  VITAL SIGNS: ED Triage Vitals  Enc Vitals Group     BP 03/20/16 0540 (!) 159/105     Pulse Rate 03/20/16 0540 (!) 150     Resp 03/20/16 0540 (!) 24     Temp 03/20/16 0540 98 F (36.7 C)     Temp Source 03/20/16 0540 Oral     SpO2 03/20/16 0540 96 %     Weight 03/20/16 0541 160 lb (72.6 kg)     Height 03/20/16 0541 5\' 5"  (1.651 m)     Head Circumference --      Peak Flow --      Pain Score 03/20/16 0541 5     Pain Loc --      Pain Edu? --      Excl. in [place]? --     Constitutional: Alert and oriented. Well appearing and in Moderate distress. Eyes: Conjunctivae are normal. PERRL. EOMI. Head: Atraumatic. Nose: No congestion/rhinnorhea. Mouth/Throat: Mucous membranes are moist.  Oropharynx non-erythematous. Cardiovascular: Tachycardia, regular rhythm. Grossly normal heart sounds.  Good peripheral circulation. Respiratory: Increased respiratory effort.  No retractions. Coarse expiratory breath sounds Gastrointestinal: Soft and nontender. No distention. Positive bowel sounds Musculoskeletal: No lower extremity tenderness nor edema.   Neurologic:  Normal speech and language. Skin:  Skin is warm, dry and intact. Psychiatric: Mood and affect are normal. Speech and behavior are normal.  ____________________________________________   LABS (all labs ordered are listed, but only abnormal results are displayed)  Labs Reviewed - No data to display ____________________________________________  EKG  ED ECG REPORT I, Loney Hering, the attending physician, personally viewed and interpreted this ECG.   Date:  03/20/2016  EKG Time: 0604  Rate: 99  Rhythm: normal sinus rhythm  Axis: normal  Intervals:none  ST&T Change: none  ____________________________________________  RADIOLOGY  CXR ____________________________________________   PROCEDURES  Procedure(s) performed: None  Procedures  Critical Care performed: No  ____________________________________________   INITIAL IMPRESSION / ASSESSMENT AND PLAN / ED COURSE  Pertinent labs & imaging results that were available during my care of the patient were reviewed by me and considered in my medical decision making (see chart for details).  This is a 61 year old female who comes into the hospital today with some shortness of breath and cough. The patient has asthma and has been using heroin inhaler without any relief. I will give the patient a dose of prednisone as well as a DuoNeb treatment. I will also send the patient for chest x-ray. I will give the patient some normal saline and then I will reassess the patient.  Clinical Course as of Mar 20 898  Wed Mar 20, 2016  0706 No evidence of active pulmonary disease. DG Chest 2 View [AW]    Clinical Course User Index [AW] Loney Hering, MD    The patient's breathing is improved after the prednisone and a DuoNeb's. I feel that she has bronchitis that she still has a cough. She did receive some Tussionex. The patient be discharged home to follow-up with her primary care physician. She has no further complaint of concerns at this time. ____________________________________________   FINAL CLINICAL IMPRESSION(S) / ED DIAGNOSES  Final diagnoses:  Bronchitis      NEW MEDICATIONS STARTED DURING THIS VISIT:  New Prescriptions   CHLORPHENIRAMINE-HYDROCODONE (TUSSIONEX PENNKINETIC ER) 10-8 MG/5ML SUER    Take 5 mLs by mouth 2 (two) times daily.   PREDNISONE (DELTASONE) 20 MG TABLET    Take 3 tablets (60 mg total) by mouth daily.     Note:  This document was prepared using Dragon  voice recognition software and may include unintentional dictation errors.    Loney Hering, MD 03/20/16 0900

## 2016-03-20 NOTE — ED Triage Notes (Signed)
Pt presents to ED with c/o difficulty breathing. Pt reports she has hx of asthma and states she has been congested with productive cough since Thursday. Pt states today her sob has continued to worsen and using her inhaler is not helping.

## 2016-03-29 ENCOUNTER — Ambulatory Visit (INDEPENDENT_AMBULATORY_CARE_PROVIDER_SITE_OTHER): Payer: BLUE CROSS/BLUE SHIELD | Admitting: Family Medicine

## 2016-03-29 ENCOUNTER — Encounter: Payer: Self-pay | Admitting: Family Medicine

## 2016-03-29 VITALS — BP 124/81 | HR 91 | Temp 98.5°F | Wt 170.4 lb

## 2016-03-29 DIAGNOSIS — J988 Other specified respiratory disorders: Secondary | ICD-10-CM | POA: Diagnosis not present

## 2016-03-29 DIAGNOSIS — I493 Ventricular premature depolarization: Secondary | ICD-10-CM | POA: Diagnosis not present

## 2016-03-29 LAB — POCT RAPID STREP A (OFFICE): RAPID STREP A SCREEN: NEGATIVE

## 2016-03-29 MED ORDER — DOXYCYCLINE HYCLATE 100 MG PO TABS: 100.0000 mg | ORAL_TABLET | Freq: Two times a day (BID) | ORAL | 0 refills | Status: DC

## 2016-03-29 NOTE — Progress Notes (Signed)
Pre visit review using our clinic review tool, if applicable. No additional management support is needed unless otherwise documented below in the visit note. 

## 2016-03-29 NOTE — Assessment & Plan Note (Addendum)
New problem (to me). Rapid strep negative. Appears stable at this time. Her exam is unremarkable. Advise continued supportive care. Doxycycline to be filled if she fails to improve or worsens.

## 2016-03-29 NOTE — Patient Instructions (Signed)
If you fail to improve, take the antibiotic.  I hope you feel better.  Take care  Dr. Lacinda Axon

## 2016-03-29 NOTE — Progress Notes (Signed)
Subjective:  Patient ID: Lynn Peters, female    DOB: 28-Dec-1955  Age: 61 y.o. MRN: [id number]  CC: Fatigue, neck pain, body aches  HPI:  61 year old female with a history of asthma presents with the above complaints.  Patient is been sick for approximately 2 weeks. Patient was seen on 2/7 in the ED. she was diagnosed with bronchitis. She was treated with prednisone, cough medication, and nebulizer treatments. Patient states that her respiratory symptoms have improved. She is now plagued by significant fatigue, body aches, and pain in the anterior cervical region. She's been taking some ibuprofen with some improvement. She is using her inhalers regularly. She's had some subjective fever and chills. No known exacerbating factors. No other complaints or concerns at this time.  Social Hx   Social History   Social History  . Marital status: Single    Spouse name: N/A  . Number of children: N/A  . Years of education: N/A   Social History Main Topics  . Smoking status: Never Smoker  . Smokeless tobacco: Never Used  . Alcohol use 0.0 oz/week  . Drug use: No  . Sexual activity: Yes    Partners: Male    Birth control/ protection: Surgical   Other Topics Concern  . 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      Diet- gym, knee limits running, paddleboating. Interval training 3x per week.       Diet-regular          Review of Systems  Constitutional:       Subjective fever, chills.   HENT: Positive for sore throat.   Respiratory: Positive for cough and shortness of breath.   Musculoskeletal: Positive for neck pain.       Body aches.   Objective:  BP 124/81 (BP Location: Left Arm, Patient Position: Sitting, Cuff Size: Normal)   Pulse 91   Temp 98.5 F (36.9 C) (Oral)   Wt 170 lb 6.4 oz (77.3 kg)   SpO2 (!) 88%   BMI 28.36 kg/m   BP/Weight 03/29/2016 [id number]  Systolic BP  [id number]  Diastolic BP 81 81 88  Wt. (Lbs) 170.4 160 171.8  BMI 28.36 26.63 28.59   Physical Exam  Constitutional: She is oriented to person, place, and time. She appears well-developed. No distress.  HENT:  Mouth/Throat: Oropharynx is clear and moist.  Normal TM's bilaterally.  Neck: Neck supple.  Cardiovascular: Normal rate.   Irregular.  Pulmonary/Chest: Effort normal and breath sounds normal. She has no wheezes. She has no rales.  Lymphadenopathy:    She has no cervical adenopathy.  Neurological: She is alert and oriented to person, place, and time.  Psychiatric: She has a normal mood and affect.  Vitals reviewed.  Lab Results  Component Value Date   WBC 7.5 03/30/2015   HGB 16.0 (H) 03/30/2015   HCT 47.4 (H) 03/30/2015   PLT 319.0 03/30/2015   GLUCOSE 99 03/30/2015   CHOL 255 (H) 01/08/2016   TRIG 126.0 01/08/2016   HDL 69.80 01/08/2016   LDLCALC 160 (H) 01/08/2016   ALT 22 03/30/2015   AST 19 03/30/2015   NA 140 03/30/2015   K 4.1 03/30/2015   CL 103 03/30/2015   CREATININE 0.80 10/05/2015   BUN 12 03/30/2015   CO2 30 03/30/2015   TSH 1.94 02/13/2016   HGBA1C 5.6  02/08/2015   Assessment & Plan:   Problem List Items Addressed This Visit    Respiratory infection - Primary    New problem (to me). Rapid strep negative. Appears stable at this time. Her exam is unremarkable. Advise continued supportive care. Doxycycline to be filled if she fails to improve or worsens.      Relevant Orders   POCT rapid strep A (Completed)   PVC (premature ventricular contraction)    New Problem. Irregularity noted with auscultation today. EKG was obtained and revealed occasional PVC. EKG otherwise unremarkable.      Relevant Orders   EKG 12-Lead (Completed)     Meds ordered this encounter  Medications  . doxycycline (VIBRA-TABS) 100 MG tablet    Sig: Take 1 tablet (100 mg total) by mouth 2 (two) times daily.    Dispense:  20 tablet    Refill:  0    Follow-up:  PRN  [place]

## 2016-03-29 NOTE — Assessment & Plan Note (Signed)
New Problem. Irregularity noted with auscultation today. EKG was obtained and revealed occasional PVC. EKG otherwise unremarkable.

## 2016-05-08 ENCOUNTER — Encounter: Payer: Self-pay | Admitting: Family

## 2016-05-12 ENCOUNTER — Other Ambulatory Visit: Payer: Self-pay | Admitting: Family

## 2016-05-12 DIAGNOSIS — R928 Other abnormal and inconclusive findings on diagnostic imaging of breast: Secondary | ICD-10-CM

## 2016-05-28 ENCOUNTER — Ambulatory Visit
Admission: RE | Admit: 2016-05-28 | Discharge: 2016-05-28 | Disposition: A | Discharge status: Home / Self Care | Payer: BLUE CROSS/BLUE SHIELD | Source: Ambulatory Visit | Attending: Family | Admitting: Family

## 2016-05-28 ENCOUNTER — Other Ambulatory Visit: Payer: Self-pay | Admitting: Family

## 2016-05-28 DIAGNOSIS — N6310 Unspecified lump in the right breast, unspecified quadrant: Secondary | ICD-10-CM

## 2016-05-28 DIAGNOSIS — N6324 Unspecified lump in the left breast, lower inner quadrant: Secondary | ICD-10-CM | POA: Diagnosis not present

## 2016-05-28 DIAGNOSIS — N6320 Unspecified lump in the left breast, unspecified quadrant: Secondary | ICD-10-CM

## 2016-05-28 DIAGNOSIS — R928 Other abnormal and inconclusive findings on diagnostic imaging of breast: Secondary | ICD-10-CM

## 2016-05-28 DIAGNOSIS — Z1239 Encounter for other screening for malignant neoplasm of breast: Secondary | ICD-10-CM

## 2016-06-13 ENCOUNTER — Ambulatory Visit (INDEPENDENT_AMBULATORY_CARE_PROVIDER_SITE_OTHER): Payer: BLUE CROSS/BLUE SHIELD | Admitting: Family

## 2016-06-13 ENCOUNTER — Encounter: Payer: Self-pay | Admitting: Family

## 2016-06-13 VITALS — BP 118/98 | HR 82 | Temp 98.4°F | Resp 16

## 2016-06-13 DIAGNOSIS — J45998 Other asthma: Secondary | ICD-10-CM | POA: Diagnosis not present

## 2016-06-13 DIAGNOSIS — J45909 Unspecified asthma, uncomplicated: Secondary | ICD-10-CM | POA: Diagnosis not present

## 2016-06-13 MED ORDER — PREDNISONE 10 MG PO TABS: ORAL_TABLET | ORAL | 0 refills | Status: DC

## 2016-06-13 MED ORDER — IPRATROPIUM-ALBUTEROL 0.5-2.5 (3) MG/3ML IN SOLN: 3.0000 mL | Freq: Three times a day (TID) | RESPIRATORY_TRACT | 11 refills | Status: DC | PRN

## 2016-06-13 MED ORDER — IPRATROPIUM-ALBUTEROL 0.5-2.5 (3) MG/3ML IN SOLN: 3.0000 mL | Freq: Four times a day (QID) | RESPIRATORY_TRACT | Status: DC

## 2016-06-13 NOTE — Patient Instructions (Signed)
Continue to use inhalers  Prednisone as discussed if you need  Let me know if you are not better     Asthma Attack Prevention, Adult Although you may not be able to control the fact that you have asthma, you can take actions to prevent episodes of asthma (asthma attacks). These actions include:  Creating a written plan for managing and treating your asthma attacks (asthma action plan).  Monitoring your asthma.  Avoiding things that can irritate your airways or make your asthma symptoms worse (asthma triggers).  Taking your medicines as directed.  Acting quickly if you have signs or symptoms of an asthma attack. What are some ways to prevent an asthma attack? Create a plan  Work with your health care provider to create an asthma action plan. This plan should include:  A list of your asthma triggers and how to avoid them.  A list of symptoms that you experience during an asthma attack.  Information about when to take medicine and how much medicine to take.  Information to help you understand your peak flow measurements.  Contact information for your health care providers.  Daily actions that you can take to control asthma. Monitor your asthma   To monitor your asthma:  Use your peak flow meter every morning and every evening for 2-3 weeks. Record the results in a journal. A drop in your peak flow numbers on one or more days may mean that you are starting to have an asthma attack, even if you are not having symptoms.  When you have asthma symptoms, write them down in a journal. Avoid asthma triggers   Work with your health care provider to find out what your asthma triggers are. This can be done by:  Being tested for allergies.  Keeping a journal that notes when asthma attacks occur and what may have contributed to them.  Asking your health care provider whether other medical conditions make your asthma worse. Common asthma triggers include:  Dust.  Smoke. This  includes campfire smoke and secondhand smoke from tobacco products.  Pet dander.  Trees, grasses or pollens.  Very cold, dry, or humid air.  Mold.  Foods that contain high amounts of sulfites.  Strong smells.  Engine exhaust and air pollution.  Aerosol sprays and fumes from household cleaners.  Household pests and their droppings, including dust mites and cockroaches.  Certain medicines, including NSAIDs. Once you have determined your asthma triggers, take steps to avoid them. Depending on your triggers, you may be able to reduce the chance of an asthma attack by:  Keeping your home clean. Have someone dust and vacuum your home for you 1 or 2 times a week. If possible, have them use a high-efficiency particulate arrestance (HEPA) vacuum.  Washing your sheets weekly in hot water.  Using allergy-proof mattress covers and casings on your bed.  Keeping pets out of your home.  Taking care of mold and water problems in your home.  Avoiding areas where people smoke.  Avoiding using strong perfumes or odor sprays.  Avoid spending a lot of time outdoors when pollen counts are high and on very windy days.  Talking with your health care provider before stopping or starting any new medicines. Medicines  Take over-the-counter and prescription medicines only as told by your health care provider. Many asthma attacks can be prevented by carefully following your medicine schedule. Taking your medicines correctly is especially important when you cannot avoid certain asthma triggers. Even if you are doing  well, do not stop taking your medicine and do not take less medicine. Act quickly  If an asthma attack happens, acting quickly can decrease how severe it is and how long it lasts. Take these actions:  Pay attention to your symptoms. If you are coughing, wheezing, or having difficulty breathing, do not wait to see if your symptoms go away on their own. Follow your asthma action plan.  If  you have followed your asthma action plan and your symptoms are not improving, call your health care provider or seek immediate medical care at the nearest hospital. It is important to write down how often you need to use your fast-acting rescue inhaler. You can track how often you use an inhaler in your journal. If you are using your rescue inhaler more often, it may mean that your asthma is not under control. Adjusting your asthma treatment plan may help you to prevent future asthma attacks and help you to gain better control of your condition. How can I prevent an asthma attack when I exercise?   Exercise is a common asthma trigger. To prevent asthma attacks during exercise:  Follow advice from your health care provider about whether you should use your fast-acting inhaler before exercising. Many people with asthma experience exercise-induced bronchoconstriction (EIB). This condition often worsens during vigorous exercise in cold, humid, or dry environments. Usually, people with EIB can stay very active by using a fast-acting inhaler before exercising.  Avoid exercising outdoors in very cold or humid weather.  Avoid exercising outdoors when pollen counts are high.  Warm up and cool down when exercising.  Stop exercising right away if asthma symptoms start. Consider taking part in exercises that are less likely to cause asthma symptoms such as:  Indoor swimming.  Biking.  Walking.  Hiking.  Playing football. This information is not intended to replace advice given to you by your health care provider. Make sure you discuss any questions you have with your health care provider. Document Released: 01/16/2009 Document Revised: 09/29/2015 Document Reviewed: 07/15/2015 Elsevier Interactive Patient Education  2017 [employer].

## 2016-06-13 NOTE — Progress Notes (Signed)
Subjective:    Patient ID: Lynn Peters, female    DOB: [birth year], 61 y.o.   MRN: [id number]  CC: Lynn Peters is a 61 y.o. female who presents today for an acute visit.    HPI: CC: dry cough x 6 days, worsening. NO fever, chills, sinus congestion.   Endorses SOB. Tried nycold and cold cough for cough without relief.   Would like neb treatment. Using albuterol very 4 hours.   h/o asthma  nonsmoker   Has seen allergy in the past.   ED 3 months ago for asthma exacerbation; did well on prednisone taper and neb- ipatropium bromide given in ED.     HISTORY:  Past Medical History:  Diagnosis Date  . Allergy   . Arthritis   . Asthma   . Chicken pox   . Diverticulitis 2009   Past Surgical History:  Procedure Laterality Date  . ABDOMINAL HYSTERECTOMY  2004   partial; left cervix and ovaries  . ANTERIOR CRUCIATE LIGAMENT REPAIR Right 2000   Family History  Problem Relation Age of Onset  . Alzheimer's disease Mother   . Asthma Mother   . Diabetes Mother   . Hyperlipidemia Mother   . Hypertension Mother   . Stroke Mother   . Heart disease Father     CHF  . Diabetes Father   . Hyperlipidemia Father   . Alcohol abuse Brother   . Heart disease Brother   . Asthma Sister   . Cancer Sister     uterine  . Diabetes Sister   . Breast cancer Neg Hx     Allergies: Flagyl [metronidazole] and Breo ellipta [fluticasone furoate-vilanterol] Current Outpatient Prescriptions on File Prior to Visit  Medication Sig Dispense Refill  . albuterol (VENTOLIN HFA) 108 (90 Base) MCG/ACT inhaler INHALE 2 PUFFS INTO THE LUNGS EVERY 4 (FOUR) HOURS AS NEEDED FOR WHEEZING OR SHORTNESS OF BREATH. 18 Inhaler 2  . Cholecalciferol (VITAMIN D) 2000 units CAPS Take 1 capsule by mouth daily.    Marland Kitchen FLOVENT HFA 110 MCG/ACT inhaler Inhale 2 puffs into the lungs 2 (two) times daily.    . montelukast (SINGULAIR) 10 MG tablet Take 10 mg by mouth at bedtime.     . Multiple Vitamins-Minerals (MULTIVITAMIN  WOMEN 50+ PO) Take 1 tablet by mouth daily.    . Omega-3 Fatty Acids (FISH OIL) 1000 MG CAPS Take 1 capsule by mouth daily.    . Probiotic Product (SOLUBLE FIBER/PROBIOTICS PO) Take by mouth.    . TURMERIC PO Take 1 capsule by mouth daily.     No current facility-administered medications on file prior to visit.     Social History  Substance Use Topics  . Smoking status: Never Smoker  . Smokeless tobacco: Never Used  . Alcohol use 0.0 oz/week    Review of Systems  Constitutional: Negative for chills and fever.  HENT: Negative for congestion, sinus pain, sinus pressure and sore throat.   Respiratory: Positive for cough and shortness of breath. Negative for wheezing.   Cardiovascular: Negative for chest pain and palpitations.  Gastrointestinal: Negative for nausea and vomiting.      Objective:    BP (!) 118/98 (BP Location: Left Arm, Patient Position: Sitting, Cuff Size: Normal)   Pulse 82   Temp 98.4 F (36.9 C) (Oral)   Resp 16   SpO2 97%    Physical Exam  Constitutional: She appears well-developed and well-nourished.  HENT:  Head: Normocephalic and atraumatic.  Right Ear: Hearing, tympanic membrane,  external ear and ear canal normal. No drainage, swelling or tenderness. No foreign bodies. Tympanic membrane is not erythematous and not bulging. No middle ear effusion. No decreased hearing is noted.  Left Ear: Hearing, tympanic membrane, external ear and ear canal normal. No drainage, swelling or tenderness. No foreign bodies. Tympanic membrane is not erythematous and not bulging.  No middle ear effusion. No decreased hearing is noted.  Nose: Nose normal. No rhinorrhea. Right sinus exhibits no maxillary sinus tenderness and no frontal sinus tenderness. Left sinus exhibits no maxillary sinus tenderness and no frontal sinus tenderness.  Mouth/Throat: Uvula is midline, oropharynx is clear and moist and mucous membranes are normal. No oropharyngeal exudate, posterior oropharyngeal  edema, posterior oropharyngeal erythema or tonsillar abscesses.  Eyes: Conjunctivae are normal.  Cardiovascular: Regular rhythm, normal heart sounds and normal pulses.   Pulmonary/Chest: Effort normal and breath sounds normal. She has no wheezes. She has no rhonchi. She has no rales.  Lymphadenopathy:       Head (right side): No submental, no submandibular, no tonsillar, no preauricular, no posterior auricular and no occipital adenopathy present.       Head (left side): No submental, no submandibular, no tonsillar, no preauricular, no posterior auricular and no occipital adenopathy present.    She has no cervical adenopathy.  Neurological: She is alert.  Skin: Skin is warm and dry.  Psychiatric: She has a normal mood and affect. Her speech is normal and behavior is normal. Thought content normal.  Vitals reviewed. Patient felt significantly better after albuterol treatment. Lung sounds clear and increased      Assessment & Plan:   Problem List Items Addressed This Visit      Respiratory   Asthma - Primary    Afebrile. No acute respiratory distress. Patient is well-appearing. She felt better after nebulizer treatment. Jointly agreed prednisone taper was appropriate. Return precautions given.       Relevant Medications   ipratropium-albuterol (DUONEB) 0.5-2.5 (3) MG/3ML nebulizer solution 3 mL (Start on 06/13/2016  3:00 PM)   predniSONE (DELTASONE) 10 MG tablet        I have discontinued Ms. Milhouse's doxycycline. I am also having her start on predniSONE. Additionally, I am having her maintain her albuterol, montelukast, Probiotic Product (SOLUBLE FIBER/PROBIOTICS PO), FLOVENT HFA, Multiple Vitamins-Minerals (MULTIVITAMIN WOMEN 50+ PO), Fish Oil, TURMERIC PO, and Vitamin D. We will continue to administer ipratropium-albuterol.   Meds ordered this encounter  Medications  . ipratropium-albuterol (DUONEB) 0.5-2.5 (3) MG/3ML nebulizer solution 3 mL  . predniSONE (DELTASONE) 10 MG tablet      Sig: Take 4 tablets ( total 40 mg) by mouth for 2 days; take 3 tablets ( total 30 mg) by mouth for 2 days; take 2 tablets ( total 20 mg) by mouth for 1 day; take 1 tablet ( total 10 mg) by mouth for 1 day.    Dispense:  17 tablet    Refill:  0    Order Specific Question:   Supervising Provider    Answer:   Crecencio Mc [[id number]]    Return precautions given.   Risks, benefits, and alternatives of the medications and treatment plan prescribed today were discussed, and patient expressed understanding.   Education regarding symptom management and diagnosis given to patient on AVS.  Continue to follow with Mable Paris, FNP for routine health maintenance.   Lavena Bullion and I agreed with plan.   Mable Paris, FNP

## 2016-06-13 NOTE — Assessment & Plan Note (Signed)
Afebrile. No acute respiratory distress. Patient is well-appearing. She felt better after nebulizer treatment. Jointly agreed prednisone taper was appropriate. Return precautions given.

## 2016-06-13 NOTE — Progress Notes (Signed)
Pre visit review using our clinic review tool, if applicable. No additional management support is needed unless otherwise documented below in the visit note. 

## 2016-06-14 ENCOUNTER — Telehealth: Payer: Self-pay | Admitting: *Deleted

## 2016-06-14 DIAGNOSIS — J40 Bronchitis, not specified as acute or chronic: Secondary | ICD-10-CM

## 2016-06-14 NOTE — Telephone Encounter (Signed)
Patient was advised to call the office if she needed a antibiotic, pt was seen in th office on 05/03. She continues to have the cough. Pt contact [phone] Pharmacy [facility]

## 2016-06-14 NOTE — Telephone Encounter (Signed)
Reason for call:cough Symptoms: cough intermittent, dry, no fever or chills Duration 6 days   Medications:Prednisone pack,  Ipratropium-Albuterol nebulizer treatment , no fever  albuterol inhaler  Last seen for this problem:06/13/16 Seen by:  Wanting an antibiotic , Inquired patient if she wanted cough syrup she refused .  I called patient back and advised if she started running fever to seek treatment at urgent care patient verbalized an understanding.  Advised patient that you were out of office today and would return on Monday.  Per Juliann Pulse this was probably related to asthma.

## 2016-06-15 ENCOUNTER — Encounter: Payer: Self-pay | Admitting: Family

## 2016-06-15 MED ORDER — CEFDINIR 300 MG PO CAPS: 300.0000 mg | ORAL_CAPSULE | Freq: Two times a day (BID) | ORAL | 0 refills | Status: DC

## 2016-06-18 DIAGNOSIS — J3081 Allergic rhinitis due to animal (cat) (dog) hair and dander: Secondary | ICD-10-CM | POA: Diagnosis not present

## 2016-06-18 DIAGNOSIS — J4540 Moderate persistent asthma, uncomplicated: Secondary | ICD-10-CM | POA: Diagnosis not present

## 2016-06-18 DIAGNOSIS — J301 Allergic rhinitis due to pollen: Secondary | ICD-10-CM | POA: Diagnosis not present

## 2016-06-18 DIAGNOSIS — J3089 Other allergic rhinitis: Secondary | ICD-10-CM | POA: Diagnosis not present

## 2016-06-24 ENCOUNTER — Telehealth: Payer: Self-pay | Admitting: *Deleted

## 2016-06-24 NOTE — Telephone Encounter (Signed)
Patient requested to have her referral moved to a different allergy office. Pt is currently going to [facility] and shes not pleased with service. Pt requested a call to discuss a better option .  Pt contact [phone]

## 2016-08-05 DIAGNOSIS — J301 Allergic rhinitis due to pollen: Secondary | ICD-10-CM | POA: Diagnosis not present

## 2016-08-05 DIAGNOSIS — J4520 Mild intermittent asthma, uncomplicated: Secondary | ICD-10-CM | POA: Diagnosis not present

## 2016-08-15 ENCOUNTER — Ambulatory Visit (INDEPENDENT_AMBULATORY_CARE_PROVIDER_SITE_OTHER): Payer: BLUE CROSS/BLUE SHIELD | Admitting: Internal Medicine

## 2016-08-15 ENCOUNTER — Encounter: Payer: Self-pay | Admitting: Internal Medicine

## 2016-08-15 ENCOUNTER — Telehealth: Payer: Self-pay | Admitting: Family

## 2016-08-15 VITALS — BP 118/82 | HR 75 | Temp 98.1°F | Resp 16 | Ht 65.0 in | Wt 168.6 lb

## 2016-08-15 DIAGNOSIS — R300 Dysuria: Secondary | ICD-10-CM | POA: Diagnosis not present

## 2016-08-15 DIAGNOSIS — E663 Overweight: Secondary | ICD-10-CM | POA: Diagnosis not present

## 2016-08-15 DIAGNOSIS — R140 Abdominal distension (gaseous): Secondary | ICD-10-CM | POA: Diagnosis not present

## 2016-08-15 LAB — URINALYSIS, MICROSCOPIC ONLY

## 2016-08-15 LAB — POCT URINALYSIS DIPSTICK
BILIRUBIN UA: NEGATIVE
GLUCOSE UA: NEGATIVE
Ketones, UA: NEGATIVE
Nitrite, UA: NEGATIVE
PH UA: 6 (ref 5.0–8.0)
Protein, UA: NEGATIVE
Urobilinogen, UA: 0.2 E.U./dL

## 2016-08-15 MED ORDER — SULFAMETHOXAZOLE-TRIMETHOPRIM 800-160 MG PO TABS: 1.0000 | ORAL_TABLET | Freq: Two times a day (BID) | ORAL | 0 refills | Status: DC

## 2016-08-15 NOTE — Telephone Encounter (Signed)
Patient scheduled.

## 2016-08-15 NOTE — Telephone Encounter (Signed)
Dr. Derrel Nip stated she will see patient at 1130 today. Can you please add her to her schedule?

## 2016-08-15 NOTE — Assessment & Plan Note (Signed)
Prior workup included CT ab and pelvis .  Suggested to keep a food diary as it may be a form of IBS or gluten intolerance

## 2016-08-15 NOTE — Progress Notes (Signed)
Subjective:  Patient ID: Lynn Peters, female    DOB: 30-Jun-1955  Age: 61 y.o. MRN: [id number]  CC: The primary encounter diagnosis was Dysuria. Diagnoses of Abdominal bloating and Overweight (BMI 25.0-29.9) were also pertinent to this visit.  HPI Lynn Peters presents for evaluation of dysuria and frequent urination ,  Suprapubic cramping, Symptoms have been present for about 30 hours.    Last eval for same in January was CULTURE NEGATIVE but positive for BV and she was treated to resolution with vaginal clindamycin.  Was treated for BV in Nov 2017 WITH FLAGYL which she did not tolerate due to nausea. Has been taking a Casei containing probiotic since then and has not had a recurrence  2) Periodic bloating despite moving bowels daily to sometimes 2 x daily.  Diet reviewed.   Eats kale daily,  Lots of vegetables,  Very little bread,  No pasta.  Avoids dairy  Because she noted that her asthma was aggravated by these foods so she avoids them.     3) difficulty losing weight .. ,  Frustrated.  High intensity aerobics 3-4 times per week. 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    Outpatient Medications Prior to Visit  Medication Sig Dispense Refill  . albuterol (VENTOLIN HFA) 108 (90 Base) MCG/ACT inhaler INHALE 2 PUFFS INTO THE LUNGS EVERY 4 (FOUR) HOURS AS NEEDED FOR WHEEZING OR SHORTNESS OF BREATH. 18 Inhaler 2  . Cholecalciferol (VITAMIN D) 2000 units CAPS Take 1 capsule by mouth daily.    Marland Kitchen FLOVENT HFA 110 MCG/ACT inhaler Inhale 2 puffs into the lungs 2 (two) times daily.    Marland Kitchen ipratropium-albuterol (DUONEB) 0.5-2.5 (3) MG/3ML SOLN Take 3 mLs by nebulization every 8 (eight) hours as needed. 360 mL 11  . montelukast (SINGULAIR) 10 MG tablet Take 10 mg by mouth at bedtime.     . Multiple Vitamins-Minerals (MULTIVITAMIN WOMEN 50+ PO) Take 1 tablet by mouth daily.    . Probiotic Product (SOLUBLE FIBER/PROBIOTICS PO) Take by mouth.    . TURMERIC PO Take 1  capsule by mouth daily.    . cefdinir (OMNICEF) 300 MG capsule Take 1 capsule (300 mg total) by mouth 2 (two) times daily. (Patient not taking: Reported on 08/15/2016) 20 capsule 0  . Omega-3 Fatty Acids (FISH OIL) 1000 MG CAPS Take 1 capsule by mouth daily.    . predniSONE (DELTASONE) 10 MG tablet Take 4 tablets ( total 40 mg) by mouth for 2 days; take 3 tablets ( total 30 mg) by mouth for 2 days; take 2 tablets ( total 20 mg) by mouth for 1 day; take 1 tablet ( total 10 mg) by mouth for 1 day. (Patient not taking: Reported on 08/15/2016) 17 tablet 0   Facility-Administered Medications Prior to Visit  Medication Dose Route Frequency Provider Last Rate Last Dose  . ipratropium-albuterol (DUONEB) 0.5-2.5 (3) MG/3ML nebulizer solution 3 mL  3 mL Nebulization Q6H Burnard Hawthorne, FNP        Review of Systems;  Patient denies headache, fevers, malaise, unintentional weight loss, skin rash, eye pain, sinus congestion and sinus pain, sore throat, dysphagia,  hemoptysis , cough, dyspnea, wheezing, chest pain, palpitations, orthopnea, edema, abdominal pain, nausea, melena, diarrhea, constipation, flank pain, dysuria, hematuria, urinary  Frequency, nocturia, numbness, tingling, seizures,  Focal weakness, Loss of consciousness,  Tremor, insomnia, depression, anxiety, and suicidal ideation.      Objective:  BP 118/82 (BP Location: Left Arm,  Patient Position: Sitting, Cuff Size: Normal)   Pulse 75   Temp 98.1 F (36.7 C) (Oral)   Resp 16   Ht 5\' 5"  (1.651 m)   Wt 168 lb 9.6 oz (76.5 kg)   SpO2 97%   BMI 28.06 kg/m   BP Readings from Last 3 Encounters:  08/15/16 118/82  06/13/16 (!) 118/98  03/29/16 124/81    Wt Readings from Last 3 Encounters:  08/15/16 168 lb 9.6 oz (76.5 kg)  03/29/16 170 lb 6.4 oz (77.3 kg)  03/20/16 160 lb (72.6 kg)    General appearance: alert, cooperative and appears stated age Ears: normal TM's and external ear canals both ears Throat: lips, mucosa, and tongue  normal; teeth and gums normal Neck: no adenopathy, no carotid bruit, supple, symmetrical, trachea midline and thyroid not enlarged, symmetric, no tenderness/mass/nodules Back: symmetric, no curvature. ROM normal. No CVA tenderness. Lungs: clear to auscultation bilaterally Heart: regular rate and rhythm, S1, S2 normal, no murmur, click, rub or gallop Abdomen: soft, non-tender; bowel sounds normal; no masses,  no organomegaly Pulses: 2+ and symmetric Skin: Skin color, texture, turgor normal. No rashes or lesions Lymph nodes: Cervical, supraclavicular, and axillary nodes normal.  Lab Results  Component Value Date   HGBA1C 5.6 02/08/2015    Lab Results  Component Value Date   CREATININE 0.80 10/05/2015   CREATININE 0.83 03/30/2015    Lab Results  Component Value Date   WBC 7.5 03/30/2015   HGB 16.0 (H) 03/30/2015   HCT 47.4 (H) 03/30/2015   PLT 319.0 03/30/2015   GLUCOSE 99 03/30/2015   CHOL 255 (H) 01/08/2016   TRIG 126.0 01/08/2016   HDL 69.80 01/08/2016   LDLCALC 160 (H) 01/08/2016   ALT 22 03/30/2015   AST 19 03/30/2015   NA 140 03/30/2015   K 4.1 03/30/2015   CL 103 03/30/2015   CREATININE 0.80 10/05/2015   BUN 12 03/30/2015   CO2 30 03/30/2015   TSH 1.94 02/13/2016   HGBA1C 5.6 02/08/2015    US Breast Ltd Uni Left Inc Axilla  Result Date: 05/28/2016 CLINICAL DATA:  Six-month follow-up for probably benign mass in the left breast at the 6 o'clock axis. EXAM: 2D DIGITAL DIAGNOSTIC LEFT MAMMOGRAM WITH CAD AND ADJUNCT TOMO ULTRASOUND LEFT BREAST COMPARISON:  Previous exams including diagnostic mammogram and ultrasound dated 10/27/2015. ACR Breast Density Category b: There are scattered areas of fibroglandular density. FINDINGS: Left breast 2D CC and MLO projections were obtained today, with additional 3D tomosynthesis. The previously described probably benign mass within the left breast at the 6 o'clock axis is stable compared to multiple exams dating back to 2013  indicating benignity, most suggestive of an [place] of normal fibroglandular tissue on 3D tomosynthesis images. Mammographic images were processed with CAD. Targeted ultrasound is performed, showing a stable hypoechoic area within the left breast at the 6 o'clock axis, corresponding to mammographic finding, with an appearance consistent with either an [place] of normal fibroglandular tissue or benign apocrine metaplasia based on real-time appearance. No suspicious solid or cystic mass is identified by ultrasound. IMPRESSION: No evidence of malignancy within the left breast. Previously described probably benign mass within the inferior left breast is stable indicating benignity. Patient may return to routine annual bilateral screening mammogram schedule. Next bilateral screening mammogram will be due in 6 months corresponding to patient's routine right breast screening mammogram schedule. RECOMMENDATION: Bilateral screening mammogram in 6 months. I have discussed the findings and recommendations with the patient. Results were also provided  in writing at the conclusion of the visit. If applicable, a reminder letter will be sent to the patient regarding the next appointment. BI-RADS CATEGORY  2: Benign. Electronically Signed   By: Franki Cabot M.D.   On: 05/28/2016 10:48   Mm Diag Breast Tomo Uni Left  Result Date: 05/28/2016 CLINICAL DATA:  Six-month follow-up for probably benign mass in the left breast at the 6 o'clock axis. EXAM: 2D DIGITAL DIAGNOSTIC LEFT MAMMOGRAM WITH CAD AND ADJUNCT TOMO ULTRASOUND LEFT BREAST COMPARISON:  Previous exams including diagnostic mammogram and ultrasound dated 10/27/2015. ACR Breast Density Category b: There are scattered areas of fibroglandular density. FINDINGS: Left breast 2D CC and MLO projections were obtained today, with additional 3D tomosynthesis. The previously described probably benign mass within the left breast at the 6 o'clock axis is stable compared to multiple exams  dating back to 2013 indicating benignity, most suggestive of an [place] of normal fibroglandular tissue on 3D tomosynthesis images. Mammographic images were processed with CAD. Targeted ultrasound is performed, showing a stable hypoechoic area within the left breast at the 6 o'clock axis, corresponding to mammographic finding, with an appearance consistent with either an [place] of normal fibroglandular tissue or benign apocrine metaplasia based on real-time appearance. No suspicious solid or cystic mass is identified by ultrasound. IMPRESSION: No evidence of malignancy within the left breast. Previously described probably benign mass within the inferior left breast is stable indicating benignity. Patient may return to routine annual bilateral screening mammogram schedule. Next bilateral screening mammogram will be due in 6 months corresponding to patient's routine right breast screening mammogram schedule. RECOMMENDATION: Bilateral screening mammogram in 6 months. I have discussed the findings and recommendations with the patient. Results were also provided in writing at the conclusion of the visit. If applicable, a reminder letter will be sent to the patient regarding the next appointment. BI-RADS CATEGORY  2: Benign. Electronically Signed   By: Franki Cabot M.D.   On: 05/28/2016 10:48    Assessment & Plan:   Problem List Items Addressed This Visit    Abdominal bloating    Prior workup included CT ab and pelvis .  Suggested to keep a food diary as it may be a form of IBS or gluten intolerance        Dysuria - Primary    Symptoms present without discharge.  Empiric treatment requested.  Septra Ds prescribed.  Discussed treatment with vaginal estrogen if culture is negative.  Continue probiotic.       Relevant Orders   POCT Urinalysis Dipstick (Completed)   Urine Culture   Urine Microscopic   Overweight (BMI 25.0-29.9)    She is exercising 3-4 times per week but otherwise has a sedentary  lifestyle. Diet is very healthy but she may not be eating frequently enough to stimulate her metabolism.  suggested she eat 6 smaller meals daily (every 3 hours)        A total of 25 minutes of face to face time was spent with patient more than half of which was spent in counselling about the above mentioned conditions  and coordination of care  I have discontinued Ms. Nifong's Fish Oil, predniSONE, and cefdinir. I am also having her start on sulfamethoxazole-trimethoprim. Additionally, I am having her maintain her albuterol, montelukast, Probiotic Product (SOLUBLE FIBER/PROBIOTICS PO), FLOVENT HFA, Multiple Vitamins-Minerals (MULTIVITAMIN WOMEN 50+ PO), TURMERIC PO, Vitamin D, and ipratropium-albuterol. We will continue to administer ipratropium-albuterol.  Meds ordered this encounter  Medications  . sulfamethoxazole-trimethoprim (BACTRIM DS,SEPTRA  DS) 800-160 MG tablet    Sig: Take 1 tablet by mouth 2 (two) times daily.    Dispense:  6 tablet    Refill:  0    Medications Discontinued During This Encounter  Medication Reason  . cefdinir (OMNICEF) 300 MG capsule Therapy completed  . Omega-3 Fatty Acids (FISH OIL) 1000 MG CAPS Patient has not taken in last 30 days  . predniSONE (DELTASONE) 10 MG tablet Therapy completed    Follow-up: No Follow-up on file.   Crecencio Mc, MD

## 2016-08-15 NOTE — Assessment & Plan Note (Signed)
Symptoms present without discharge.  Empiric treatment requested.  Septra Ds prescribed.  Discussed treatment with vaginal estrogen if culture is negative.  Continue probiotic.

## 2016-08-15 NOTE — Telephone Encounter (Signed)
Pt called c/o possible uti. Pt states that she is having frequent and painful urination, along with slight fever. Pt states that she has had it for about 36 hours. Please advise, thank you!  Call pt @ [phone]

## 2016-08-15 NOTE — Assessment & Plan Note (Signed)
She is exercising 3-4 times per week but otherwise has a sedentary lifestyle. Diet is very healthy but she may not be eating frequently enough to stimulate her metabolism.  suggested she eat 6 smaller meals daily (every 3 hours)

## 2016-08-15 NOTE — Patient Instructions (Addendum)
Your "dipstick" urinalysis suggested inflammation but does not confirm infection;  The urine culture will confirm whether infection is present or not, and whether the bacteria will respond to the Septra that I have prescribed.  Inflammation without infection can be due to noninfectious causes such as post menopausal atrophic vaginitis or interstitial cystitis.  The former can be treated with vaginal estrogen.  The latter requires an evaluation by a Urologist with cystoscopy (thiis is an evaluation of your bladder with a small fiberoptic camera inserted through your urethra under local anesthesia)   I will send you a e mail either way once the  Results of your culture  are available  for review.    Taking an antibiotic can create an imbalance in the normal population of bacteria that live in the small intestine.  This imbalance can persist for 3 months.   Taking a probiotic for a minimum of 3 weeks may help prevent a serious antibiotic associated diarrhea  Called clostridium dificile colitis that occurs when the bacteria population is altered .  Taking a probiotic may also prevent vaginitis (as you have already figured out!)  due to yeast  And gardnerella  and can be continued indefinitely if you feel that it improves your digestion or your elimination (bowels).

## 2016-08-15 NOTE — Progress Notes (Signed)
w

## 2016-08-16 ENCOUNTER — Ambulatory Visit: Payer: BLUE CROSS/BLUE SHIELD | Admitting: Family Medicine

## 2016-08-16 ENCOUNTER — Encounter: Payer: Self-pay | Admitting: Internal Medicine

## 2016-08-16 LAB — URINE CULTURE: Organism ID, Bacteria: NO GROWTH

## 2016-08-19 ENCOUNTER — Other Ambulatory Visit: Payer: Self-pay | Admitting: Internal Medicine

## 2016-08-19 MED ORDER — ESTROGENS, CONJUGATED 0.625 MG/GM VA CREA: 1.0000 g | TOPICAL_CREAM | Freq: Every day | VAGINAL | 0 refills | Status: DC

## 2016-09-16 ENCOUNTER — Ambulatory Visit (INDEPENDENT_AMBULATORY_CARE_PROVIDER_SITE_OTHER): Payer: BLUE CROSS/BLUE SHIELD | Admitting: Family Medicine

## 2016-09-16 ENCOUNTER — Encounter: Payer: Self-pay | Admitting: Family Medicine

## 2016-09-16 VITALS — BP 110/70 | HR 83 | Temp 98.2°F | Resp 12 | Wt 172.1 lb

## 2016-09-16 DIAGNOSIS — M5442 Lumbago with sciatica, left side: Secondary | ICD-10-CM | POA: Diagnosis not present

## 2016-09-16 MED ORDER — CYCLOBENZAPRINE HCL 10 MG PO TABS: 10.0000 mg | ORAL_TABLET | Freq: Every day | ORAL | 0 refills | Status: DC

## 2016-09-16 MED ORDER — DICLOFENAC SODIUM 75 MG PO TBEC: 75.0000 mg | DELAYED_RELEASE_TABLET | Freq: Two times a day (BID) | ORAL | 0 refills | Status: DC | PRN

## 2016-09-16 NOTE — Assessment & Plan Note (Signed)
New acute problem. She has an MRI report with her today from 2016. Revealed disc protrusion and impingement on L5. Treating with Diclofenac and Flexeril. Sending to PT. If persists will need to see neurosurgery.

## 2016-09-16 NOTE — Patient Instructions (Signed)
Medications as prescribed.  We will call about the PT.  Take care  Dr. Lacinda Axon

## 2016-09-16 NOTE — Progress Notes (Signed)
Subjective:  Patient ID: Lynn Peters, female    DOB: [birth year]  Age: 61 y.o. MRN: [id number]  CC: Back pain  HPI:  61 year old female presents with complaints of back pain.  Patient reports that she was outside working in the yard yesterday and developed severe low back pain. Started after she was pruning some shrubs. Pain is severe. Has been worsening. Located in the left lumbar spine. She reports some radicular symptoms in the left leg but states that this is chronic. She's been using ibuprofen with no improvement. Exacerbated by activity/ROM. No known relieving factors. No incontinence/saddle anesthesia. No other associated symptoms. No other complaints at this time.  Social Hx   Social History   Social History  . Marital status: Single    Spouse name: N/A  . Number of children: N/A  . Years of education: N/A   Social History Main Topics  . Smoking status: Never Smoker  . Smokeless tobacco: Never Used  . Alcohol use 0.0 oz/week  . Drug use: No  . Sexual activity: Yes    Partners: Male    Birth control/ protection: Surgical   Other Topics Concern  . 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      Diet- gym, knee limits running, paddleboating. Interval training 3x per week.       Diet-regular          Review of Systems  Constitutional: Negative.   Musculoskeletal: Positive for back pain.   Objective:  BP 110/70 (BP Location: Right Arm, Patient Position: Sitting, Cuff Size: Normal)   Pulse 83   Temp 98.2 F (36.8 C) (Oral)   Resp 12   Wt 172 lb 2 oz (78.1 kg)   SpO2 98%   BMI 28.64 kg/m   BP/Weight 09/16/2016 2/0/2542 7/0/6237  Systolic BP 628 315 176  Diastolic BP 70 82 98  Wt. (Lbs) 172.13 168.6 -  BMI 28.64 28.06 -    Physical Exam  Constitutional: She is oriented to person, place, and time. She appears well-developed. No distress.  Cardiovascular: Normal rate  and regular rhythm.   Pulmonary/Chest: Effort normal. She has no wheezes. She has no rales.  Musculoskeletal:  Low back - decreased range of motion. Negative straight leg raise. Nontender to palpation.  Neurological: She is alert and oriented to person, place, and time.  Psychiatric: She has a normal mood and affect.  Vitals reviewed.   Lab Results  Component Value Date   WBC 7.5 03/30/2015   HGB 16.0 (H) 03/30/2015   HCT 47.4 (H) 03/30/2015   PLT 319.0 03/30/2015   GLUCOSE 99 03/30/2015   CHOL 255 (H) 01/08/2016   TRIG 126.0 01/08/2016   HDL 69.80 01/08/2016   LDLCALC 160 (H) 01/08/2016   ALT 22 03/30/2015   AST 19 03/30/2015   NA 140 03/30/2015   K 4.1 03/30/2015   CL 103 03/30/2015   CREATININE 0.80 10/05/2015   BUN 12 03/30/2015   CO2 30 03/30/2015   TSH 1.94 02/13/2016   HGBA1C 5.6 02/08/2015    Assessment & Plan:   Problem List Items Addressed This Visit    Acute left-sided low back pain with left-sided sciatica - Primary    New acute problem. She has an MRI report with her today from 2016. Revealed disc protrusion and impingement on L5. Treating with Diclofenac and Flexeril. Sending to  PT. If persists will need to see neurosurgery.      Relevant Medications   cyclobenzaprine (FLEXERIL) 10 MG tablet   diclofenac (VOLTAREN) 75 MG EC tablet   Other Relevant Orders   Ambulatory referral to Physical Therapy      Meds ordered this encounter  Medications  . cyclobenzaprine (FLEXERIL) 10 MG tablet    Sig: Take 1 tablet (10 mg total) by mouth at bedtime.    Dispense:  30 tablet    Refill:  0  . diclofenac (VOLTAREN) 75 MG EC tablet    Sig: Take 1 tablet (75 mg total) by mouth 2 (two) times daily as needed.    Dispense:  14 tablet    Refill:  0    Follow-up: PRN  [place]

## 2016-09-19 DIAGNOSIS — M545 Low back pain: Secondary | ICD-10-CM | POA: Diagnosis not present

## 2016-09-23 DIAGNOSIS — M545 Low back pain: Secondary | ICD-10-CM | POA: Diagnosis not present

## 2016-09-27 ENCOUNTER — Encounter: Payer: Self-pay | Admitting: Pulmonary Disease

## 2016-09-27 ENCOUNTER — Ambulatory Visit (INDEPENDENT_AMBULATORY_CARE_PROVIDER_SITE_OTHER): Payer: BLUE CROSS/BLUE SHIELD | Admitting: Pulmonary Disease

## 2016-09-27 VITALS — BP 138/86 | HR 96 | Ht 65.0 in | Wt 168.0 lb

## 2016-09-27 DIAGNOSIS — J301 Allergic rhinitis due to pollen: Secondary | ICD-10-CM

## 2016-09-27 DIAGNOSIS — Z23 Encounter for immunization: Secondary | ICD-10-CM

## 2016-09-27 DIAGNOSIS — J4540 Moderate persistent asthma, uncomplicated: Secondary | ICD-10-CM | POA: Diagnosis not present

## 2016-09-27 MED ORDER — FLOVENT HFA 110 MCG/ACT IN AERO: 2.0000 | INHALATION_SPRAY | Freq: Two times a day (BID) | RESPIRATORY_TRACT | 12 refills | Status: DC

## 2016-09-27 NOTE — Patient Instructions (Signed)
Continue Flovent 110 g inhaler - 2 actuation's twice a day. I have sent refill ordered to the pharmacy  Continue albuterol inhaler as her rescue inhaler - the goal is to minimize the use of this inhaler but it may be used prior to exercise. If you are using it more than 5 times per week as a rescue inhaler, then your asthma control is inadequate  Pneumonia shot today  Lung function tests have been ordered  Follow-up in 3 months  For the first year, we will try to see each other each season paying particular attention to asthma control during the pollen seasons

## 2016-10-01 DIAGNOSIS — M545 Low back pain: Secondary | ICD-10-CM | POA: Diagnosis not present

## 2016-10-01 NOTE — Progress Notes (Signed)
PULMONARY CONSULT NOTE  Requesting MD/Service: Lacinda Axon Date of initial consultation: 09/27/16 Reason for consultation: Asthma  PT PROFILE: 61 y.o. female never smoker with asthma since childhood  HPI:  As above. She moved from [place] to [place] approximately 4 years ago. Since that time, she has noted increase in symptoms, especially during the spring months. She notes triggers of pollen, dairy products, wheat products, cold weather. She has undergone allergy testing with significant response to dog dander, cat dander, local pollens, dust mites. She has been tried on [employer] inhaler previously which she could not tolerate due to tremor. However, she can tolerate albuterol MDI and does not note this problem. She is presently maintained on a Flovent inhaler with Singulair (which she doesn't think does much good) and albuterol as needed. She also occasionally takes a low-dose antihistamine. At the present time, because of the summer month, she feels that her asthma is reasonably well controlled.  Past Medical History:  Diagnosis Date  . Allergy   . Arthritis   . Asthma   . Chicken pox   . Diverticulitis 2009    Past Surgical History:  Procedure Laterality Date  . ABDOMINAL HYSTERECTOMY  2004   partial; left cervix and ovaries  . ANTERIOR CRUCIATE LIGAMENT REPAIR Right 2000    MEDICATIONS: I have reviewed all medications and confirmed regimen as documented  Social History   Social History  . Marital status: Single    Spouse name: N/A  . Number of children: N/A  . Years of education: N/A   Occupational History  . Not on file.   Social History Main Topics  . Smoking status: Never Smoker  . Smokeless tobacco: Never Used  . Alcohol use 0.0 oz/week  . Drug use: No  . Sexual activity: Yes    Partners: Male    Birth control/ protection: Surgical   Other Topics Concern  . 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      Diet- gym, knee limits running, paddleboating. Interval training 3x per week.       Diet-regular          Family History  Problem Relation Age of Onset  . Alzheimer's disease Mother   . Asthma Mother   . Diabetes Mother   . Hyperlipidemia Mother   . Hypertension Mother   . Stroke Mother   . Heart disease Father        CHF  . Diabetes Father   . Hyperlipidemia Father   . Alcohol abuse Brother   . Heart disease Brother   . Asthma Sister   . Cancer Sister        uterine  . Diabetes Sister   . Breast cancer Neg Hx     ROS: No fever, myalgias/arthralgias, unexplained weight loss or weight gain No new focal weakness or sensory deficits No otalgia, hearing loss, visual changes, nasal and sinus symptoms, mouth and throat problems No neck pain or adenopathy No abdominal pain, N/V/D, diarrhea, change in bowel pattern No dysuria, change in urinary pattern   Vitals:   09/27/16 0958 09/27/16 1003  BP:  138/86  Pulse:  96  SpO2:  98%  Weight: 168 lb (76.2 kg)   Height: 5\' 5"  (1.651 m)   RA  EXAM:  Gen: WDWN in NAD HEENT: NCAT, sclerae white, oropharynx normal Neck: NO LAN,  no JVD noted Lungs: full BS, normal percussion note throughout, no adventitious sounds Cardiovascular: Reg rate, normal rhythm, no M noted Abdomen: Soft, NT, +BS Ext: no C/C/E Neuro: PERRL, EOMI, motor/sensory grossly intact Skin: No lesions noted   DATA:   BMP Latest Ref Rng & Units 10/05/2015 03/30/2015  Glucose 70 - 99 mg/dL - 99  BUN 6 - 23 mg/dL - 12  Creatinine 0.44 - 1.00 mg/dL 0.80 0.83  Sodium 135 - 145 mEq/L - 140  Potassium 3.5 - 5.1 mEq/L - 4.1  Chloride 96 - 112 mEq/L - 103  CO2 19 - 32 mEq/L - 30  Calcium 8.4 - 10.5 mg/dL - 10.1    CBC Latest Ref Rng & Units 03/30/2015  WBC 4.0 - 10.5 K/uL 7.5  Hemoglobin 12.0 - 15.0 g/dL 16.0(H)  Hematocrit 36.0 - 46.0 % 47.4(H)  Platelets 150.0 - 400.0 K/uL 319.0    CXR 03/20/16:  No acute  cardiac or pulmonary findings  IMPRESSION:     ICD-10-CM   1. Moderate persistent asthma without complication B20.10 Pulmonary Function Test [facility] Only  2. Seasonal allergic rhinitis due to pollen J30.1     PLAN:  Continue Flovent 110 g inhaler - 2 actuation's twice a day. I have sent refill order to the pharmacy  Continue albuterol inhaler as her rescue inhaler - the goal is to minimize the use of this inhaler but it may be used prior to exercise. If she is using it more than 5 times per week as a rescue inhaler, then her asthma control is inadequate  Pneumonia shot today administered on the day of this encounter  PFTs have been ordered  Follow-up in 3 months  For the first year, we will try to see her each season paying particular attention to asthma control during the pollen seasons   Merton Border, MD PCCM service Mobile [phone] Pager [phone] 10/01/2016 3:18 PM

## 2016-10-08 DIAGNOSIS — M545 Low back pain: Secondary | ICD-10-CM | POA: Diagnosis not present

## 2016-10-17 ENCOUNTER — Encounter: Payer: Self-pay | Admitting: Family

## 2016-10-17 ENCOUNTER — Encounter: Payer: Self-pay | Admitting: Family Medicine

## 2016-10-18 ENCOUNTER — Other Ambulatory Visit: Payer: Self-pay | Admitting: Family Medicine

## 2016-10-18 MED ORDER — DICLOFENAC SODIUM 75 MG PO TBEC: 75.0000 mg | DELAYED_RELEASE_TABLET | Freq: Two times a day (BID) | ORAL | 1 refills | Status: DC | PRN

## 2016-10-22 ENCOUNTER — Other Ambulatory Visit: Payer: Self-pay | Admitting: Family

## 2016-10-22 DIAGNOSIS — Z1239 Encounter for other screening for malignant neoplasm of breast: Secondary | ICD-10-CM

## 2016-11-27 ENCOUNTER — Ambulatory Visit: Payer: BLUE CROSS/BLUE SHIELD | Admitting: *Deleted

## 2016-11-27 ENCOUNTER — Ambulatory Visit: Payer: Self-pay

## 2016-11-27 DIAGNOSIS — Z23 Encounter for immunization: Secondary | ICD-10-CM

## 2016-11-27 NOTE — Progress Notes (Signed)
Patient came into office for flushot.

## 2016-12-18 NOTE — Progress Notes (Unsigned)
This encounter was created in error - please disregard.

## 2016-12-24 ENCOUNTER — Ambulatory Visit: Payer: BLUE CROSS/BLUE SHIELD | Attending: Pulmonary Disease

## 2016-12-24 DIAGNOSIS — J4540 Moderate persistent asthma, uncomplicated: Secondary | ICD-10-CM | POA: Diagnosis not present

## 2016-12-27 ENCOUNTER — Encounter: Payer: Self-pay | Admitting: Pulmonary Disease

## 2016-12-27 ENCOUNTER — Ambulatory Visit: Payer: BLUE CROSS/BLUE SHIELD | Admitting: Pulmonary Disease

## 2016-12-27 VITALS — BP 110/86 | HR 85 | Ht 65.0 in

## 2016-12-27 DIAGNOSIS — J4530 Mild persistent asthma, uncomplicated: Secondary | ICD-10-CM

## 2016-12-27 MED ORDER — FLUTICASONE PROPIONATE HFA 220 MCG/ACT IN AERO: 2.0000 | INHALATION_SPRAY | Freq: Two times a day (BID) | RESPIRATORY_TRACT | 10 refills | Status: DC

## 2016-12-27 NOTE — Patient Instructions (Signed)
Increase Flovent to 220 mcg -2 inhalations twice a day.  Rinse mouth after use Continue albuterol as needed Our measurement of asthma control is how often you need the albuterol rescue inhaler with a goal of fewer than 4 times per week  Follow-up in 6 months or sooner as needed

## 2016-12-29 NOTE — Progress Notes (Signed)
PULMONARY OFFICE FOLLOW-UP NOTE  Requesting MD/Service: Lacinda Axon Date of initial consultation: 09/27/16 Reason for consultation: Asthma  PT PROFILE: 61 y.o. female never smoker with asthma since childhood  DATA: 12/24/16 PFTs: Normal spirometry, normal lung volumes, normal DLCO  SUBJ:  This is a routine reevaluation.  There have been no major events since last visit in August.  Until recently, her asthma has been very well controlled.  However, of late, she is using albuterol twice a day (only 1 puff at a time)Denies CP, fever, purulent sputum, hemoptysis, LE edema and calf tenderness.  OBJ Vitals:   12/27/16 1337 12/27/16 1340  BP:  110/86  Pulse:  85  SpO2:  98%  Height: 5\' 5"  (1.651 m)   RA  EXAM:  Gen: NAD HEENT: NCAT, WNL Neck: No LAN, no JVD noted Lungs: full BS, no wheezes Cardiovascular: Reg, no M noted Abdomen: Soft, NT, +BS Ext: no C/C/E Neuro: grossly intact   DATA:   BMP Latest Ref Rng & Units 10/05/2015 03/30/2015  Glucose 70 - 99 mg/dL - 99  BUN 6 - 23 mg/dL - 12  Creatinine 0.44 - 1.00 mg/dL 0.80 0.83  Sodium 135 - 145 mEq/L - 140  Potassium 3.5 - 5.1 mEq/L - 4.1  Chloride 96 - 112 mEq/L - 103  CO2 19 - 32 mEq/L - 30  Calcium 8.4 - 10.5 mg/dL - 10.1    CBC Latest Ref Rng & Units 03/30/2015  WBC 4.0 - 10.5 K/uL 7.5  Hemoglobin 12.0 - 15.0 g/dL 16.0(H)  Hematocrit 36.0 - 46.0 % 47.4(H)  Platelets 150.0 - 400.0 K/uL 319.0    CXR:  NNF  IMPRESSION:     ICD-10-CM   1. Mild persistent asthma without complication Q22.29    With her increased frequency of rescue inhaler use, will we discussed augmenting her controller regimen.  We discussed the possibility of adding a LABA.  In the past, this is cause problems with palpitations and she would prefer to go to a higher dose to ICS.  PLAN:  Increase Flovent to 220 g inhaler - 2 actuation's twice a day. I have sent refill order to the pharmacy.  She may double the number of actuations on her current inhaler  (110 mcg) until she runs out  Continue albuterol inhaler as her rescue inhaler - the goal is to minimize the use of this inhaler but it may be used prior to exercise. If she is using it more than 10 actuations per week as a rescue inhaler, then her asthma control is inadequate  Follow-up in 6 months or sooner as needed   Merton Border, MD PCCM service Mobile [phone] Pager [phone] 12/29/2016 2:11 PM

## 2016-12-30 ENCOUNTER — Ambulatory Visit: Payer: BLUE CROSS/BLUE SHIELD | Admitting: Pulmonary Disease

## 2017-01-14 ENCOUNTER — Encounter: Payer: Self-pay | Admitting: Pulmonary Disease

## 2017-01-14 ENCOUNTER — Other Ambulatory Visit: Payer: Self-pay | Admitting: Pulmonary Disease

## 2017-01-14 MED ORDER — AMBULATORY NON FORMULARY MEDICATION: 0 refills | Status: DC

## 2017-01-14 NOTE — Progress Notes (Signed)
rx for spacer will be sent per patient request to [facility].

## 2017-01-22 ENCOUNTER — Telehealth: Payer: Self-pay | Admitting: *Deleted

## 2017-01-22 NOTE — Telephone Encounter (Signed)
Called patient checked status on getting spacer. She did have to pay out of pocket but wanted Dr. Alva Garnet to know she feels 99% better. Nothing further needed.

## 2017-02-09 ENCOUNTER — Encounter: Payer: Self-pay | Admitting: Internal Medicine

## 2017-02-10 MED ORDER — PANTOPRAZOLE SODIUM 40 MG PO TBEC: 40.0000 mg | DELAYED_RELEASE_TABLET | Freq: Every day | ORAL | 3 refills | Status: DC

## 2017-02-10 NOTE — Telephone Encounter (Signed)
Can I use 02/20/17 @ 11:30?

## 2017-02-14 ENCOUNTER — Encounter: Payer: Self-pay | Admitting: Internal Medicine

## 2017-02-14 ENCOUNTER — Ambulatory Visit: Payer: BLUE CROSS/BLUE SHIELD | Admitting: Internal Medicine

## 2017-02-14 VITALS — BP 128/88 | HR 83 | Temp 97.5°F | Resp 16 | Ht 65.0 in | Wt 162.6 lb

## 2017-02-14 DIAGNOSIS — K2100 Gastro-esophageal reflux disease with esophagitis, without bleeding: Secondary | ICD-10-CM

## 2017-02-14 DIAGNOSIS — K209 Esophagitis, unspecified without bleeding: Secondary | ICD-10-CM

## 2017-02-14 DIAGNOSIS — R140 Abdominal distension (gaseous): Secondary | ICD-10-CM

## 2017-02-14 MED ORDER — FAMOTIDINE 20 MG PO TABS
20.0000 mg | ORAL_TABLET | Freq: Every day | ORAL | 2 refills | Status: AC
Start: 2017-02-14 — End: ?

## 2017-02-14 MED ORDER — PANTOPRAZOLE SODIUM 40 MG PO TBEC
40.0000 mg | DELAYED_RELEASE_TABLET | Freq: Two times a day (BID) | ORAL | 0 refills | Status: DC
Start: 2017-02-14 — End: 2017-02-28

## 2017-02-14 NOTE — Progress Notes (Signed)
Subjective:  Patient ID: Lynn Peters, female    DOB: 1955/05/15  Age: 62 y.o. MRN: [id number]  CC: The primary encounter diagnosis was Abdominal bloating. Diagnoses of Esophagitis and Reflux esophagitis were also pertinent to this visit.  HPI Lynn Peters presents for follow up on GERD   She states that her she has been having esophageal pain described as burning, that radiates to left ear .  The pain started after her pulmonologist increased her steroid  MDI  for management of asthma .  She has been taking pepcid and protonix for the last several days,   which is helping somewhat but still having pain that radiates to left ear and shoulder.  The pain does not occur with exercise  And is occurring at night with supine position as well.     Wonders if she has a hiatal hernia since her mother has one and she has been having post prandial bloating as well.    Outpatient Medications Prior to Visit  Medication Sig Dispense Refill  . albuterol (VENTOLIN HFA) 108 (90 Base) MCG/ACT inhaler INHALE 2 PUFFS INTO THE LUNGS EVERY 4 (FOUR) HOURS AS NEEDED FOR WHEEZING OR SHORTNESS OF BREATH. 18 Inhaler 2  . AMBULATORY NON FORMULARY MEDICATION Medication Name: Spacer to be used with Flovent inhaler. 1 each 0  . Cholecalciferol (VITAMIN D) 2000 units CAPS Take 1 capsule by mouth daily.    . fluticasone (FLOVENT HFA) 220 MCG/ACT inhaler Inhale 2 puffs 2 (two) times daily into the lungs. 1 Inhaler 10  . ipratropium-albuterol (DUONEB) 0.5-2.5 (3) MG/3ML SOLN Take 3 mLs by nebulization every 8 (eight) hours as needed. 360 mL 11  . Multiple Vitamins-Minerals (MULTIVITAMIN WOMEN 50+ PO) Take 1 tablet by mouth daily.    . Probiotic Product (SOLUBLE FIBER/PROBIOTICS PO) Take by mouth.    . TURMERIC PO Take 1 capsule by mouth daily.    . pantoprazole (PROTONIX) 40 MG tablet Take 1 tablet (40 mg total) by mouth daily. 30 tablet 3   Facility-Administered Medications Prior to Visit  Medication Dose Route Frequency  Provider Last Rate Last Dose  . ipratropium-albuterol (DUONEB) 0.5-2.5 (3) MG/3ML nebulizer solution 3 mL  3 mL Nebulization Q6H Burnard Hawthorne, FNP        Review of Systems;  Patient denies headache, fevers, malaise, unintentional weight loss, skin rash, eye pain, sinus congestion and sinus pain, sore throat, dysphagia,  hemoptysis , cough, dyspnea, wheezing, chest pain, palpitations, orthopnea, edema, abdominal pain, nausea, melena, diarrhea, constipation, flank pain, dysuria, hematuria, urinary  Frequency, nocturia, numbness, tingling, seizures,  Focal weakness, Loss of consciousness,  Tremor, insomnia, depression, anxiety, and suicidal ideation.      Objective:  BP 128/88 (BP Location: Left Arm, Patient Position: Sitting, Cuff Size: Normal)   Pulse 83   Temp (!) 97.5 F (36.4 C) (Oral)   Resp 16   Ht 5\' 5"  (1.651 m)   Wt 162 lb 9.6 oz (73.8 kg)   SpO2 98%   BMI 27.06 kg/m   BP Readings from Last 3 Encounters:  02/14/17 128/88  12/27/16 110/86  09/27/16 138/86    Wt Readings from Last 3 Encounters:  02/14/17 162 lb 9.6 oz (73.8 kg)  09/27/16 168 lb (76.2 kg)  09/16/16 172 lb 2 oz (78.1 kg)    General appearance: alert, cooperative and appears stated age Throat: lips, mucosa, and tongue normal; teeth and gums normal. NO THRUSH  Neck: no adenopathy, no carotid bruit, supple, symmetrical, trachea midline and thyroid  not enlarged, symmetric, no tenderness/mass/nodules Back: symmetric, no curvature. ROM normal. No CVA tenderness. Lungs: clear to auscultation bilaterally Heart: regular rate and rhythm, S1, S2 normal, no murmur, click, rub or gallop Abdomen: soft, non-tender; bowel sounds normal; no masses,  no organomegaly Pulses: 2+ and symmetric Skin: Skin color, texture, turgor normal. No rashes or lesions Lymph nodes: Cervical, supraclavicular, and axillary nodes normal.  Lab Results  Component Value Date   HGBA1C 5.6 02/08/2015    Lab Results  Component Value  Date   CREATININE 0.80 10/05/2015   CREATININE 0.83 03/30/2015    Lab Results  Component Value Date   WBC 7.5 03/30/2015   HGB 16.0 (H) 03/30/2015   HCT 47.4 (H) 03/30/2015   PLT 319.0 03/30/2015   GLUCOSE 99 03/30/2015   CHOL 255 (H) 01/08/2016   TRIG 126.0 01/08/2016   HDL 69.80 01/08/2016   LDLCALC 160 (H) 01/08/2016   ALT 22 03/30/2015   AST 19 03/30/2015   NA 140 03/30/2015   K 4.1 03/30/2015   CL 103 03/30/2015   CREATININE 0.80 10/05/2015   BUN 12 03/30/2015   CO2 30 03/30/2015   TSH 1.94 02/13/2016   HGBA1C 5.6 02/08/2015    Assessment & Plan:   Problem List Items Addressed This Visit    Reflux esophagitis    Aggravated by use of steroid MDI.  incresae protonix to bid 30 minutes prior to meals and continue evening dose of famotidine.  UGI/SBFT ordered to rule out hiatal hernia.  .  GI referral if no improvement       Other Visit Diagnoses    Abdominal bloating    -  Primary   Relevant Orders   DG UGI W/SMALL BOWEL   Esophagitis       Relevant Orders   DG UGI W/SMALL BOWEL      I have changed Lynn Peters's pantoprazole. I am also having her start on famotidine. Additionally, I am having her maintain her albuterol, Probiotic Product (SOLUBLE FIBER/PROBIOTICS PO), Multiple Vitamins-Minerals (MULTIVITAMIN WOMEN 50+ PO), TURMERIC PO, Vitamin D, ipratropium-albuterol, fluticasone, and AMBULATORY NON FORMULARY MEDICATION. We will continue to administer ipratropium-albuterol.  Meds ordered this encounter  Medications  . pantoprazole (PROTONIX) 40 MG tablet    Sig: Take 1 tablet (40 mg total) by mouth 2 (two) times daily before a meal.    Dispense:  60 tablet    Refill:  0  . famotidine (PEPCID) 20 MG tablet    Sig: Take 1 tablet (20 mg total) by mouth daily.    Dispense:  30 tablet    Refill:  2    Medications Discontinued During This Encounter  Medication Reason  . pantoprazole (PROTONIX) 40 MG tablet     Follow-up: No Follow-up on file.   Crecencio Mc, MD

## 2017-02-14 NOTE — Patient Instructions (Addendum)
YOU HAVE REFLUX ESOPHAGITIS   INCREASE PROTONIX TO TWICE DAILY   30 MINUTES  PRIOR TO ANY INGESTION OF FOOD OR COFFEE.  YOU CAN TAKE PEPCID FOR THE NEXT 2 DAYS IN THE AFTERNOON AS WELL.    STAY ON THIS REGIMEN FOR 2 WEEKS.  IF BURNING SENSATION SUBSIDES,  YOU CAN REDUCE THE REGIMEN BACK TO AN  AFTERNOON PEPCID INSTEAD OF THE  AFTERNOON  DOSE OF PROTONIX , BUT CONTINUE THE MORNING PROTONIX   UPPER GI SMALL BOWEL FOLLOW THROUGH (TO INVESTIGATE  THE BLOATING AND CONCERN FOR HIATAL HERNIA ) HAS BEEN ORDERED

## 2017-02-16 DIAGNOSIS — K2100 Gastro-esophageal reflux disease with esophagitis, without bleeding: Secondary | ICD-10-CM | POA: Insufficient documentation

## 2017-02-16 NOTE — Assessment & Plan Note (Signed)
Aggravated by use of steroid MDI.  incresae protonix to bid 30 minutes prior to meals and continue evening dose of famotidine.  UGI/SBFT ordered to rule out hiatal hernia.  .  GI referral if no improvement

## 2017-02-18 ENCOUNTER — Encounter: Payer: Self-pay | Admitting: Internal Medicine

## 2017-02-26 ENCOUNTER — Encounter: Payer: Self-pay | Admitting: Internal Medicine

## 2017-02-26 ENCOUNTER — Ambulatory Visit
Admission: RE | Admit: 2017-02-26 | Discharge: 2017-02-26 | Disposition: A | Discharge status: Home / Self Care | Payer: BLUE CROSS/BLUE SHIELD | Source: Ambulatory Visit | Attending: Internal Medicine | Admitting: Internal Medicine

## 2017-02-26 DIAGNOSIS — K209 Esophagitis, unspecified without bleeding: Secondary | ICD-10-CM

## 2017-02-26 DIAGNOSIS — K219 Gastro-esophageal reflux disease without esophagitis: Secondary | ICD-10-CM | POA: Insufficient documentation

## 2017-02-26 DIAGNOSIS — R140 Abdominal distension (gaseous): Secondary | ICD-10-CM | POA: Diagnosis not present

## 2017-02-28 ENCOUNTER — Other Ambulatory Visit: Payer: Self-pay

## 2017-02-28 MED ORDER — PANTOPRAZOLE SODIUM 40 MG PO TBEC: 40.0000 mg | DELAYED_RELEASE_TABLET | Freq: Two times a day (BID) | ORAL | 2 refills | Status: DC

## 2017-03-28 DIAGNOSIS — K048 Radicular cyst: Secondary | ICD-10-CM | POA: Diagnosis not present

## 2017-04-06 ENCOUNTER — Encounter: Payer: Self-pay | Admitting: Pulmonary Disease

## 2017-04-07 ENCOUNTER — Other Ambulatory Visit: Payer: Self-pay | Admitting: *Deleted

## 2017-04-07 MED ORDER — FLUTICASONE PROPIONATE HFA 220 MCG/ACT IN AERO: 2.0000 | INHALATION_SPRAY | Freq: Two times a day (BID) | RESPIRATORY_TRACT | 1 refills | Status: DC

## 2017-04-15 ENCOUNTER — Telehealth: Payer: Self-pay | Admitting: Family

## 2017-04-15 NOTE — Telephone Encounter (Signed)
[facility]   In reviewing your chart, it appears your are due for annual mammogram since your last in April 2018. At that time, they advised a repeat in 6 months from that time.   Please let us know if you would like for me to order. You may call the office to let us know, and we will order for you.   Once the order in the system, you may schedule at your preferred location.   Typically women have been using one of the sites below however you may go where you have been in the past. I would ensure that when you do get a mammogram that it is 3D ( as opposed to 2D which was prior technology). Evidence suggests that 3D is superior.   Please note that NOT all insurance companies cover 3D, and you may have to pay a higher copay. You may call your insurance company to further clarify your benefits.   Options for Mammogram in [place]:    [facility]  [place], [place]   [facility] [place], [place]   Let us know if you have questions.   My best,   Mable Paris, NP

## 2017-04-24 ENCOUNTER — Encounter: Payer: Self-pay | Admitting: Pulmonary Disease

## 2017-04-24 ENCOUNTER — Other Ambulatory Visit: Payer: Self-pay | Admitting: Pulmonary Disease

## 2017-04-24 MED ORDER — ALBUTEROL SULFATE HFA 108 (90 BASE) MCG/ACT IN AERS: INHALATION_SPRAY | RESPIRATORY_TRACT | 1 refills | Status: DC

## 2017-04-25 ENCOUNTER — Encounter: Payer: Self-pay | Admitting: Family

## 2017-04-25 NOTE — Telephone Encounter (Signed)
Pt has viewed message via [employer]

## 2017-04-28 ENCOUNTER — Other Ambulatory Visit: Payer: Self-pay | Admitting: Family

## 2017-04-28 DIAGNOSIS — Z1239 Encounter for other screening for malignant neoplasm of breast: Secondary | ICD-10-CM

## 2017-07-11 ENCOUNTER — Ambulatory Visit
Admission: RE | Admit: 2017-07-11 | Discharge: 2017-07-11 | Disposition: A | Discharge status: Home / Self Care | Payer: BLUE CROSS/BLUE SHIELD | Source: Ambulatory Visit | Attending: Family | Admitting: Family

## 2017-07-11 DIAGNOSIS — Z1239 Encounter for other screening for malignant neoplasm of breast: Secondary | ICD-10-CM

## 2017-07-11 DIAGNOSIS — Z1231 Encounter for screening mammogram for malignant neoplasm of breast: Secondary | ICD-10-CM | POA: Diagnosis not present

## 2017-08-02 ENCOUNTER — Encounter: Payer: Self-pay | Admitting: Internal Medicine

## 2017-08-18 ENCOUNTER — Encounter: Payer: Self-pay | Admitting: Internal Medicine

## 2017-08-20 ENCOUNTER — Encounter: Payer: Self-pay | Admitting: Internal Medicine

## 2017-08-20 ENCOUNTER — Other Ambulatory Visit: Payer: Self-pay | Admitting: Internal Medicine

## 2017-08-20 DIAGNOSIS — B351 Tinea unguium: Secondary | ICD-10-CM | POA: Insufficient documentation

## 2017-08-20 DIAGNOSIS — M2142 Flat foot [pes planus] (acquired), left foot: Secondary | ICD-10-CM

## 2017-08-20 DIAGNOSIS — M2141 Flat foot [pes planus] (acquired), right foot: Secondary | ICD-10-CM | POA: Insufficient documentation

## 2017-08-25 ENCOUNTER — Encounter: Payer: Self-pay | Admitting: Internal Medicine

## 2017-08-25 NOTE — Telephone Encounter (Signed)
Pt has been scheduled for Wednesday at 4:30. Pt is aware of appt date and time.

## 2017-08-27 ENCOUNTER — Ambulatory Visit: Payer: BLUE CROSS/BLUE SHIELD | Admitting: Internal Medicine

## 2017-08-27 ENCOUNTER — Encounter: Payer: Self-pay | Admitting: Internal Medicine

## 2017-08-27 VITALS — BP 104/86 | HR 81 | Temp 98.0°F | Resp 15 | Ht 65.0 in | Wt 161.4 lb

## 2017-08-27 DIAGNOSIS — R0681 Apnea, not elsewhere classified: Secondary | ICD-10-CM | POA: Diagnosis not present

## 2017-08-27 DIAGNOSIS — I499 Cardiac arrhythmia, unspecified: Secondary | ICD-10-CM

## 2017-08-27 DIAGNOSIS — I493 Ventricular premature depolarization: Secondary | ICD-10-CM | POA: Diagnosis not present

## 2017-08-27 DIAGNOSIS — G4734 Idiopathic sleep related nonobstructive alveolar hypoventilation: Secondary | ICD-10-CM

## 2017-08-27 DIAGNOSIS — R0789 Other chest pain: Secondary | ICD-10-CM

## 2017-08-27 DIAGNOSIS — J4530 Mild persistent asthma, uncomplicated: Secondary | ICD-10-CM

## 2017-08-27 DIAGNOSIS — R079 Chest pain, unspecified: Secondary | ICD-10-CM | POA: Diagnosis not present

## 2017-08-27 NOTE — Progress Notes (Signed)
Subjective:  Patient ID: Lynn Peters, female    DOB: [birth year]  Age:. CC: The primary encounter diagnosis was Chest pain, unspecified type. Diagnoses of Mild persistent asthma without complication, PVC's (premature ventricular contractions), Cardiac arrhythmia, unspecified cardiac arrhythmia type, Nocturnal hypoxemia, Apneic episode, and Atypical chest pain were also pertinent to this visit.  HPI Lynn Peters presents for urgent evaluation of "breathing problems."  Patient  sent  a mychart message on July 15 about a recent episode of waking up choking /coughing.  She denied  wheezing, but spent the night waking up repeatedly because she felt that every time she fell asleep,  She stopped breathing.  Toward morning, she tried using her  albuterol MDI and took an  extra flovent dose, which didn't seem to help.  She has not taken any opioids or sedating medications recently.  She recalls a previous occurrence when she took an opioid  In February after oral surgery, and another episode after using tussionex once for an asthma exacerbation .   Patient has a history of asthma and GERD. However she is not taking the prescribed PPI,  protonix because she felt that  it "slowed her respiration rate and aggravated her asthma." . In lieu of PPI therapy she is taking famotidine  Once daily , prn Tums, and  papaya enzymes .  She has a careful diet, does not eat within 2 hours of reclining,  And uses a wedge pillow.    Also describes concurrent non exertional lateral chest wall pain and pain occasionally on the left anterrior neck.   She exercises vigorously and the pain has not occurred during exercise.   Currently endorses fatigue.  Does not snore.     She is very anxious today and is requesting a cardiac workup.   Outpatient Medications Prior to Visit  Medication Sig Dispense Refill  . albuterol (VENTOLIN HFA) 108 (90 Base) MCG/ACT inhaler INHALE 2 PUFFS INTO THE LUNGS EVERY 4 (FOUR) HOURS AS NEEDED FOR WHEEZING  OR SHORTNESS OF BREATH. 48 Inhaler 1  . AMBULATORY NON FORMULARY MEDICATION Medication Name: Spacer to be used with Flovent inhaler. 1 each 0  . Cholecalciferol (VITAMIN D) 2000 units CAPS Take 1 capsule by mouth daily.    . famotidine (PEPCID) 20 MG tablet Take 1 tablet (20 mg total) by mouth daily. 30 tablet 2  . fluticasone (FLOVENT HFA) 220 MCG/ACT inhaler Inhale 2 puffs into the lungs 2 (two) times daily. 3 Inhaler 1  . ipratropium-albuterol (DUONEB) 0.5-2.5 (3) MG/3ML SOLN Take 3 mLs by nebulization every 8 (eight) hours as needed. 360 mL 11  . Multiple Vitamins-Minerals (MULTIVITAMIN WOMEN 50+ PO) Take 1 tablet by mouth daily.    . Probiotic Product (SOLUBLE FIBER/PROBIOTICS PO) Take by mouth.    . TURMERIC PO Take 1 capsule by mouth daily.    . pantoprazole (PROTONIX) 40 MG tablet Take 1 tablet (40 mg total) by mouth 2 (two) times daily before a meal. (Patient not taking: Reported on 08/27/2017) 60 tablet 2   Facility-Administered Medications Prior to Visit  Medication Dose Route Frequency Provider Last Rate Last Dose  . ipratropium-albuterol (DUONEB) 0.5-2.5 (3) MG/3ML nebulizer solution 3 mL  3 mL Nebulization Q6H Burnard Hawthorne, FNP        Review of Systems;  Patient denies headache, fevers, malaise, unintentional weight loss, skin rash, eye pain, sinus congestion and sinus pain, sore throat, dysphagia,  hemoptysis , cough, dyspnea, wheezing, chest pain, palpitations, orthopnea, edema, abdominal pain, nausea, melena,  diarrhea, constipation, flank pain, dysuria, hematuria, urinary  Frequency, nocturia, numbness, tingling, seizures,  Focal weakness, Loss of consciousness,  Tremor, insomnia, depression, anxiety, and suicidal ideation.      Objective:  BP 104/86 (BP Location: Left Arm, Patient Position: Sitting, Cuff Size: Normal)   Pulse 81   Temp 98 F (36.7 C) (Oral)   Resp 15   Ht 5\' 5"  (1.651 m)   Wt 161 lb 6.4 oz (73.2 kg)   SpO2 98%   BMI 26.86 kg/m   BP Readings  from Last 3 Encounters:  08/27/17 104/86  02/14/17 128/88  12/27/16 110/86    Wt Readings from Last 3 Encounters:  08/27/17 161 lb 6.4 oz (73.2 kg)  02/14/17 162 lb 9.6 oz (73.8 kg)  09/27/16 168 lb (76.2 kg)    General appearance: alert, cooperative and appears stated age Ears: normal TM's and external ear canals both ears Throat: lips, mucosa, and tongue normal; teeth and gums normal Neck: no adenopathy, no carotid bruit, supple, symmetrical, trachea midline and thyroid not enlarged, symmetric, no tenderness/mass/nodules Back: symmetric, no curvature. ROM normal. No CVA tenderness. Lungs: clear to auscultation bilaterally Heart: regular rate and rhythm, S1, S2 normal, no murmur, click, rub or gallop Abdomen: soft, non-tender; bowel sounds normal; no masses,  no organomegaly Pulses: 2+ and symmetric Skin: Skin color, texture, turgor normal. No rashes or lesions Lymph nodes: Cervical, supraclavicular, and axillary nodes normal.  Lab Results  Component Value Date   HGBA1C 5.6 02/08/2015    Lab Results  Component Value Date   CREATININE 0.80 10/05/2015   CREATININE 0.83 03/30/2015    Lab Results  Component Value Date   WBC 7.5 03/30/2015   HGB 16.0 (H) 03/30/2015   HCT 47.4 (H) 03/30/2015   PLT 319.0 03/30/2015   GLUCOSE 99 03/30/2015   CHOL 255 (H) 01/08/2016   TRIG 126.0 01/08/2016   HDL 69.80 01/08/2016   LDLCALC 160 (H) 01/08/2016   ALT 22 03/30/2015   AST 19 03/30/2015   NA 140 03/30/2015   K 4.1 03/30/2015   CL 103 03/30/2015   CREATININE 0.80 10/05/2015   BUN 12 03/30/2015   CO2 30 03/30/2015   TSH 1.94 02/13/2016   HGBA1C 5.6 02/08/2015    Mm Screening Breast Tomo Bilateral  Result Date: 07/14/2017 CLINICAL DATA:  Screening. EXAM: DIGITAL SCREENING BILATERAL MAMMOGRAM WITH TOMO AND CAD COMPARISON:  Previous exam(s). ACR Breast Density Category b: There are scattered areas of fibroglandular density. FINDINGS: There are no findings suspicious for  malignancy. Images were processed with CAD. IMPRESSION: No mammographic evidence of malignancy. A result letter of this screening mammogram will be mailed directly to the patient. RECOMMENDATION: Screening mammogram in one year. (Code:SM-B-01Y) BI-RADS CATEGORY  1: Negative. Electronically Signed   By: Fidela Salisbury M.D.   On: 07/14/2017 12:50    Assessment & Plan:   Problem List Items Addressed This Visit    Mild persistent asthma    October 2018 pulmonology note reviewed.  She [facility]  With flovent 220 mcg bid and albuterol MDI.  LABA considered at last visit but not ordered because it historically caused palpitations.  Last PFTS normal  Per Pulmonology .  6 month follow up overdue.       Atypical chest pain    Non exertional,  In a patient with no known  risk factors , active GERD and increased anxiety. Refer to Cardiology for evaluation       Apneic episode  She has no precedent illness ,  injury or  medical  history to support Ondine's Curse (what she is actually describing) Unclear whether her current symptoms are due to untreated apneic spells,  Reflux induced cough, or true orthopnea. Her exam  Today is notable a regularly irregular heartbeat and a clear lung exam .  I have reviewed her EKG from today  And note that she is in NSR with PVC's ad left atrial enlargement.  .  Will schedule a sleep study as well as a cardiology evaluation with ECHO.       Other Visit Diagnoses    Chest pain, unspecified type    -  Primary   Relevant Orders   EKG 12-Lead (Completed)   Lipid panel   Ambulatory referral to Cardiology   PVC's (premature ventricular contractions)       Relevant Orders   Comprehensive metabolic panel   TSH   Magnesium   Cardiac arrhythmia, unspecified cardiac arrhythmia type       Relevant Orders   Ambulatory referral to Sleep Studies   Ambulatory referral to Cardiology   Nocturnal hypoxemia       Relevant Orders   Ambulatory referral  to Sleep Studies   Ambulatory referral to Cardiology     A total of 40 minutes was spent with patient more than half of which was spent in counseling patient on the above mentioned issues , reviewing and explaining recent EKG, and coordination of care.  I have discontinued Siani Mooty's pantoprazole. I am also having her maintain her Probiotic Product (SOLUBLE FIBER/PROBIOTICS PO), Multiple Vitamins-Minerals (MULTIVITAMIN WOMEN 50+ PO), TURMERIC PO, Vitamin D, ipratropium-albuterol, AMBULATORY NON FORMULARY MEDICATION, famotidine, fluticasone, and albuterol. We will continue to administer ipratropium-albuterol.  No orders of the defined types were placed in this encounter.   Medications Discontinued During This Encounter  Medication Reason  . pantoprazole (PROTONIX) 40 MG tablet Patient has not taken in last 30 days    Follow-up: No follow-ups on file.   Crecencio Mc, MD

## 2017-08-27 NOTE — Patient Instructions (Signed)
Referral to Dr Fletcher Anon and for sleep study  Return for fasting labs    Premature Ventricular Contraction A premature ventricular contraction (PVC) is a common irregularity in the normal heart rhythm. These contractions are extra heartbeats that start in the heart ventricles and occur too early in the normal sequence. During the PVC, the heart's normal electrical pathway is not used, so the beat is shorter and less effective. In most cases, these contractions come and go and do not require treatment. What are the causes? In many cases, the cause may not be known. Common causes of the condition include:  Smoking.  Drinking alcohol.  Caffeine.  Certain medicines.  Some illegal drugs.  Stress.  Certain medical conditions can also cause PVCs:  Changes in minerals in the blood (electrolytes).  Heart failure.  Heart valve problems.  Low blood oxygen levels or high carbon dioxide levels.  Heart attack, or coronary artery disease.  What are the signs or symptoms? The main symptom of this condition is a fast or skipped heartbeat (palpitations). Other symptoms include:  Chest pain.  Shortness of breath.  Feeling tired.  Dizziness.  In some cases, there are no symptoms. How is this diagnosed? This condition may be diagnosed based on:  Your medical history.  A physical exam. During the exam, the health care provider will check for irregular heartbeats.  Tests, such as: ? An ECG (electrocardiogram) to monitor the electrical activity of your heart. ? Holter monitor testing. This involves wearing a device that clips to your clothing and monitors the electrical activity of your heart over longer periods of time. ? Stress tests to see how exercise affects your heart rhythm and blood supply. ? Echocardiogram. This test uses sound waves (ultrasound) to produce an image of your heart. ? Electrophysiology study. This test checks the electric pathways in your heart.  How is this  treated? Treatment depends on any underlying conditions, the type of PVCs that you are having, and how much the symptoms are interfering with your daily life. Possible treatments include:  Avoiding things that can trigger the premature contractions, such as caffeine or alcohol.  Medicines. These may be given if symptoms are severe or if the extra heartbeats are frequent.  Treatment for any underlying condition that is found to be the cause of the contractions.  Catheter ablation. This procedure destroys the heart tissues that send abnormal signals.  In some cases, no treatment is required. Follow these instructions at home: Lifestyle Follow these instructions as told by your health care provider:  Do not use any products that contain nicotine or tobacco, such as cigarettes and e-cigarettes. If you need help quitting, ask your health care provider.  If caffeine triggers episodes of PVC, do not eat, drink, or use anything with caffeine in it.  If caffeine does not seem to trigger episodes, consume caffeine in moderation.  If alcohol triggers episodes of PVC, do not drink alcohol.  If alcohol does not seem to trigger episodes, limit alcohol intake to no more than 1 drink a day for nonpregnant women and 2 drinks a day for men. One drink equals 12 oz of beer, 5 oz of wine, or 1 oz of hard liquor.  Exercise regularly. Ask your health care provider what type of exercise is safe for you.  Find healthy ways to manage stress. Avoid stressful situations when possible.  Try to get at least 7-9 hours of sleep each night, or as much as recommended by your health care provider.  Do not use illegal drugs.  General instructions  Take over-the-counter and prescription medicines only as told by your health care provider.  Keep all follow-up visits as told by your health care provider. This is important. Get help right away if:  You feel palpitations that are frequent or continual.  You have  chest pain.  You have shortness of breath.  You have sweating for no reason.  You have nausea and vomiting.  You become light-headed or you faint. This information is not intended to replace advice given to you by your health care provider. Make sure you discuss any questions you have with your health care provider. Document Released: 09/15/2003 Document Revised: 09/22/2015 Document Reviewed: 07/05/2015 Elsevier Interactive Patient Education  [employer].

## 2017-08-27 NOTE — Assessment & Plan Note (Addendum)
October 2018 pulmonology note reviewed.  She is uUnder treatment by [facility]  With flovent 220 mcg bid and albuterol MDI.  LABA considered at last visit but not ordered because it historically caused palpitations.  Last PFTS normal  Per Pulmonology .  6 month follow up overdue.

## 2017-08-28 ENCOUNTER — Other Ambulatory Visit (INDEPENDENT_AMBULATORY_CARE_PROVIDER_SITE_OTHER): Payer: BLUE CROSS/BLUE SHIELD

## 2017-08-28 DIAGNOSIS — R079 Chest pain, unspecified: Secondary | ICD-10-CM

## 2017-08-28 DIAGNOSIS — I493 Ventricular premature depolarization: Secondary | ICD-10-CM

## 2017-08-28 DIAGNOSIS — E785 Hyperlipidemia, unspecified: Secondary | ICD-10-CM

## 2017-08-29 ENCOUNTER — Other Ambulatory Visit: Payer: BLUE CROSS/BLUE SHIELD

## 2017-08-29 ENCOUNTER — Encounter: Payer: Self-pay | Admitting: Internal Medicine

## 2017-08-29 ENCOUNTER — Telehealth: Payer: Self-pay | Admitting: Internal Medicine

## 2017-08-29 DIAGNOSIS — R0789 Other chest pain: Secondary | ICD-10-CM | POA: Insufficient documentation

## 2017-08-29 DIAGNOSIS — I493 Ventricular premature depolarization: Secondary | ICD-10-CM

## 2017-08-29 DIAGNOSIS — R0601 Orthopnea: Secondary | ICD-10-CM

## 2017-08-29 DIAGNOSIS — R0681 Apnea, not elsewhere classified: Secondary | ICD-10-CM

## 2017-08-29 LAB — COMPREHENSIVE METABOLIC PANEL
ALT: 31 U/L (ref 0–35)
AST: 17 U/L (ref 0–37)
Albumin: 4.3 g/dL (ref 3.5–5.2)
Alkaline Phosphatase: 70 U/L (ref 39–117)
BUN: 9 mg/dL (ref 6–23)
CO2: 29 meq/L (ref 19–32)
Calcium: 10 mg/dL (ref 8.4–10.5)
Chloride: 103 mEq/L (ref 96–112)
Creatinine, Ser: 0.87 mg/dL (ref 0.40–1.20)
GFR: 70.01 mL/min (ref >60.00)
GLUCOSE: 94 mg/dL (ref 70–99)
POTASSIUM: 4.7 meq/L (ref 3.5–5.1)
SODIUM: 140 meq/L (ref 135–145)
Total Bilirubin: 0.5 mg/dL (ref 0.2–1.2)
Total Protein: 7.6 g/dL (ref 6.0–8.3)

## 2017-08-29 LAB — LIPID PANEL
CHOL/HDL RATIO: 3
Cholesterol: 210 mg/dL — ABNORMAL HIGH (ref 0–200)
HDL: 60.8 mg/dL (ref >39.00)
LDL Cholesterol: 127 mg/dL — ABNORMAL HIGH (ref 0–99)
NONHDL: 149.31
Triglycerides: 110 mg/dL (ref 0.0–149.0)
VLDL: 22 mg/dL (ref 0.0–40.0)

## 2017-08-29 LAB — MAGNESIUM: MAGNESIUM: 2.4 mg/dL (ref 1.5–2.5)

## 2017-08-29 LAB — TSH: TSH: 1.99 u[IU]/mL (ref 0.35–4.50)

## 2017-08-29 NOTE — Assessment & Plan Note (Signed)
Non exertional,  In a patient with no known  risk factors , active GERD and increased anxiety. Refer to Cardiology for evaluation

## 2017-08-29 NOTE — Assessment & Plan Note (Addendum)
She has no precedent illness ,  injury or  medical  history to support Ondine's Curse (what she is actually describing) Unclear whether her current symptoms are due to untreated apneic spells,  Reflux induced cough, or true orthopnea. Her exam  Today is notable a regularly irregular heartbeat and a clear lung exam .  I have reviewed her EKG from today  And note that she is in NSR with PVC's ad left atrial enlargement.  .  Will schedule a sleep study as well as a cardiology evaluation with ECHO.

## 2017-08-29 NOTE — Telephone Encounter (Signed)
Please advise 

## 2017-08-29 NOTE — Telephone Encounter (Signed)
Copied from [place] [id number]. Topic: Referral - Question >> Aug 29, 2017  2:46 PM Margot Ables wrote: Reason for CRM: pt received a call from Cardiology and cannot see the doctor until September. Pt is concerned and wondering if an echo can be ordered by PCP in the meantime or how to proceed. Please call pt.

## 2017-08-30 DIAGNOSIS — E785 Hyperlipidemia, unspecified: Secondary | ICD-10-CM | POA: Insufficient documentation

## 2017-08-30 NOTE — Assessment & Plan Note (Signed)
ased on current lipid profile, the risk of clinically significant Coronary artery disease is 4 % over the next 10 years, using the Framingham risk calculator.  No indication for therapy.

## 2017-09-01 NOTE — Telephone Encounter (Signed)
Melissa,  Are there any cardiologists here or in [place]  That may have earlier appt than September?

## 2017-09-02 ENCOUNTER — Encounter (INDEPENDENT_AMBULATORY_CARE_PROVIDER_SITE_OTHER): Payer: Self-pay

## 2017-09-02 NOTE — Telephone Encounter (Signed)
I got her in with Dr. Saralyn Pilar at [facility] for Aug. 1. I told her to keep the appt with Dr. Fletcher Anon also so she can establish with him.

## 2017-09-03 NOTE — Telephone Encounter (Signed)
Thank you Lenna Sciara !!

## 2017-09-10 ENCOUNTER — Telehealth: Payer: Self-pay

## 2017-09-10 NOTE — Telephone Encounter (Signed)
Copied from [place] [id number]. Topic: Referral - Medical Records >> Sep 10, 2017 11:50 AM Lennox Solders wrote: Reason for CRM: sandy is calling for dr paraschos cardiologist office at [facility] and pt has an appt tomorrow 09-11-17 and they need copy of EKG fax to [phone]  EKG faxed to [facility] attn: Lovey Newcomer to the number listed

## 2017-09-11 DIAGNOSIS — R079 Chest pain, unspecified: Secondary | ICD-10-CM | POA: Diagnosis not present

## 2017-09-11 DIAGNOSIS — R9431 Abnormal electrocardiogram [ECG] [EKG]: Secondary | ICD-10-CM | POA: Diagnosis not present

## 2017-09-16 ENCOUNTER — Telehealth: Payer: Self-pay

## 2017-09-16 DIAGNOSIS — M1990 Unspecified osteoarthritis, unspecified site: Secondary | ICD-10-CM | POA: Insufficient documentation

## 2017-09-17 ENCOUNTER — Encounter: Payer: Self-pay | Admitting: Podiatry

## 2017-09-17 ENCOUNTER — Ambulatory Visit: Payer: BLUE CROSS/BLUE SHIELD | Admitting: Podiatry

## 2017-09-17 VITALS — BP 138/91 | HR 76 | Resp 16

## 2017-09-17 DIAGNOSIS — M2011 Hallux valgus (acquired), right foot: Secondary | ICD-10-CM

## 2017-09-17 DIAGNOSIS — M722 Plantar fascial fibromatosis: Secondary | ICD-10-CM

## 2017-09-17 DIAGNOSIS — M779 Enthesopathy, unspecified: Secondary | ICD-10-CM

## 2017-09-17 DIAGNOSIS — L603 Nail dystrophy: Secondary | ICD-10-CM

## 2017-09-17 DIAGNOSIS — M778 Other enthesopathies, not elsewhere classified: Secondary | ICD-10-CM

## 2017-09-17 DIAGNOSIS — M2012 Hallux valgus (acquired), left foot: Secondary | ICD-10-CM

## 2017-09-17 MED ORDER — CICLOPIROX 8 % EX SOLN: Freq: Every day | CUTANEOUS | 11 refills | Status: DC

## 2017-09-17 NOTE — Progress Notes (Signed)
Subjective:  Patient ID: Lynn Peters, female    DOB: [birth year],  MRN: [id number] HPI Chief Complaint  Patient presents with  . Nail Problem    Toenails right - thickened, white discoloration x several years, tried 2 OTC topical meds, not interested in taking oral medications, also athlete's feet on right as well  . Foot Orthotics    Questions regarding orthotics-sometimes arches hurt  . New Patient (Initial Visit)    62 y.o. female presents with the above complaint.  ROS: Denies fever chills nausea vomiting muscle aches pains calf pain back pain chest pain shortness of breath.    Past Medical History:  Diagnosis Date  . Allergy   . Arthritis   . Asthma   . Chicken pox   . Diverticulitis 2009   Past Surgical History:  Procedure Laterality Date  . ABDOMINAL HYSTERECTOMY  2004   partial; left cervix and ovaries  . ANTERIOR CRUCIATE LIGAMENT REPAIR Right 2000    Current Outpatient Medications:  .  fluticasone furoate-vilanterol (BREO ELLIPTA) 100-25 MCG/INH AEPB, INHALE 1 PUFF DAILY, Disp: , Rfl:  .  pantoprazole (PROTONIX) 40 MG tablet, Take by mouth., Disp: , Rfl:  .  albuterol (VENTOLIN HFA) 108 (90 Base) MCG/ACT inhaler, INHALE 2 PUFFS INTO THE LUNGS EVERY 4 (FOUR) HOURS AS NEEDED FOR WHEEZING OR SHORTNESS OF BREATH., Disp: 48 Inhaler, Rfl: 1 .  AMBULATORY NON FORMULARY MEDICATION, Medication Name: Spacer to be used with Flovent inhaler., Disp: 1 each, Rfl: 0 .  Cholecalciferol (VITAMIN D) 2000 units CAPS, Take 1 capsule by mouth daily., Disp: , Rfl:  .  ciclopirox (PENLAC) 8 % solution, Apply topically at bedtime. Apply to nail/surrounding skin and daily over previous coat. Every 7 days remove with alcohol and continue., Disp: 6.6 mL, Rfl: 11 .  famotidine (PEPCID) 20 MG tablet, Take 1 tablet (20 mg total) by mouth daily., Disp: 30 tablet, Rfl: 2 .  fluticasone (FLOVENT HFA) 220 MCG/ACT inhaler, Inhale 2 puffs into the lungs 2 (two) times daily., Disp: 3 Inhaler, Rfl: 1 .   ipratropium-albuterol (DUONEB) 0.5-2.5 (3) MG/3ML SOLN, Take 3 mLs by nebulization every 8 (eight) hours as needed., Disp: 360 mL, Rfl: 11 .  Multiple Vitamins-Minerals (MULTIVITAMIN WOMEN 50+ PO), Take 1 tablet by mouth daily., Disp: , Rfl:  .  Probiotic Product (SOLUBLE FIBER/PROBIOTICS PO), Take by mouth., Disp: , Rfl:  .  TURMERIC PO, Take 1 capsule by mouth daily., Disp: , Rfl:   Current Facility-Administered Medications:  .  ipratropium-albuterol (DUONEB) 0.5-2.5 (3) MG/3ML nebulizer solution 3 mL, 3 mL, Nebulization, Q6H, Arnett, Yvetta Coder, FNP  Allergies  Allergen Reactions  . Pantoprazole Sodium Shortness Of Breath  . Flagyl [Metronidazole]     Neck pain, spasm  . Tetracyclines & Related   . Breo Ellipta [Fluticasone Furoate-Vilanterol] Palpitations   Review of Systems Objective:   Vitals:   09/17/17 1104  BP: (!) 138/91  Pulse: 76  Resp: 16    General: Well developed, nourished, in no acute distress, alert and oriented x3   Dermatological: Skin is warm, dry and supple bilateral. Nails x 10 are well maintained; remaining integument appears unremarkable at this time. There are no open sores, no preulcerative lesions, no rash or signs of infection present.  Appears to be no dystrophy or onychomycosis hallux second third digits of the right foot.  Vascular: Dorsalis Pedis artery and Posterior Tibial artery pedal pulses are 2/4 bilateral with immedate capillary fill time. Pedal hair growth present. No varicosities and no  lower extremity edema present bilateral.   Neruologic: Grossly intact via light touch bilateral. Vibratory intact via tuning fork bilateral. Protective threshold with Semmes Wienstein monofilament intact to all pedal sites bilateral. Patellar and Achilles deep tendon reflexes 2+ bilateral. No Babinski or clonus noted bilateral.   Musculoskeletal: No gross boney pedal deformities bilateral. No pain, crepitus, or limitation noted with foot and ankle range of  motion bilateral. Muscular strength 5/5 in all groups tested bilateral.  Hallux abductovalgus deformity mild tailor's bunion deformities bilateral foot of her foot is very flexible mild tenderness on palpation medial longitudinal arch.  Gait: Unassisted, Nonantalgic.    Radiographs:  None taken  Assessment & Plan:   Assessment: Metatarsalgia midfoot fasciitis bunion deformity tailor's bunion deformity nail dystrophy/onychomycosis right foot only.  Plan: Started her on ciclopirox.  She was casted today for orthotics.     Max T. [place], [place]

## 2017-09-17 NOTE — Telephone Encounter (Signed)
Error

## 2017-09-18 DIAGNOSIS — I493 Ventricular premature depolarization: Secondary | ICD-10-CM | POA: Diagnosis not present

## 2017-09-19 ENCOUNTER — Encounter: Payer: Self-pay | Admitting: Podiatry

## 2017-10-01 DIAGNOSIS — R079 Chest pain, unspecified: Secondary | ICD-10-CM | POA: Diagnosis not present

## 2017-10-01 DIAGNOSIS — R9431 Abnormal electrocardiogram [ECG] [EKG]: Secondary | ICD-10-CM | POA: Diagnosis not present

## 2017-10-06 DIAGNOSIS — R9431 Abnormal electrocardiogram [ECG] [EKG]: Secondary | ICD-10-CM | POA: Diagnosis not present

## 2017-10-06 DIAGNOSIS — I493 Ventricular premature depolarization: Secondary | ICD-10-CM | POA: Diagnosis not present

## 2017-10-06 DIAGNOSIS — E785 Hyperlipidemia, unspecified: Secondary | ICD-10-CM | POA: Diagnosis not present

## 2017-10-06 DIAGNOSIS — R079 Chest pain, unspecified: Secondary | ICD-10-CM | POA: Diagnosis not present

## 2017-10-08 ENCOUNTER — Ambulatory Visit: Payer: BLUE CROSS/BLUE SHIELD | Admitting: Orthotics

## 2017-10-08 DIAGNOSIS — M2012 Hallux valgus (acquired), left foot: Principal | ICD-10-CM

## 2017-10-08 DIAGNOSIS — M2011 Hallux valgus (acquired), right foot: Secondary | ICD-10-CM

## 2017-10-20 ENCOUNTER — Encounter: Payer: Self-pay | Admitting: Neurology

## 2017-10-20 ENCOUNTER — Ambulatory Visit (INDEPENDENT_AMBULATORY_CARE_PROVIDER_SITE_OTHER): Payer: BLUE CROSS/BLUE SHIELD | Admitting: Neurology

## 2017-10-20 VITALS — BP 123/87 | HR 68 | Ht 65.75 in | Wt 160.0 lb

## 2017-10-20 DIAGNOSIS — K219 Gastro-esophageal reflux disease without esophagitis: Secondary | ICD-10-CM

## 2017-10-20 DIAGNOSIS — I493 Ventricular premature depolarization: Secondary | ICD-10-CM

## 2017-10-20 DIAGNOSIS — E663 Overweight: Secondary | ICD-10-CM

## 2017-10-20 DIAGNOSIS — J4520 Mild intermittent asthma, uncomplicated: Secondary | ICD-10-CM

## 2017-10-20 DIAGNOSIS — R002 Palpitations: Secondary | ICD-10-CM

## 2017-10-20 DIAGNOSIS — R0683 Snoring: Secondary | ICD-10-CM | POA: Diagnosis not present

## 2017-10-20 DIAGNOSIS — R351 Nocturia: Secondary | ICD-10-CM

## 2017-10-20 DIAGNOSIS — G4719 Other hypersomnia: Secondary | ICD-10-CM | POA: Diagnosis not present

## 2017-10-20 NOTE — Patient Instructions (Signed)

## 2017-10-20 NOTE — Progress Notes (Signed)
Subjective:    Patient ID: Lynn Peters is a 62 y.o. female.  HPI     Star Age, MD, PhD [facility] [address], [address] [place], [place] [address]  Dear Dr. Derrel Nip,   I saw your patient, Lynn Peters, upon your kind request in my neurologic clinic today for initial consultation of her sleep disorder, in particular, concern for underlying obstructive sleep apnea. The patient is unaccompanied today. As you know, Lynn Peters is a 62 year old right-handed woman with an underlying medical history of palpitations, PVCs, asthma, reflux disease, allergies, arthritis and mildly overweight state, who reports snoring and daytime somnolence. I reviewed your office note from 08/27/2017. She has woken up with a sense of gasping for air and palpitations at night. She was referred to cardiology for workup. Her Epworth sleepiness score is 11 out of 24, fatigue score is 31 out of 63. She lives with her boyfriend, she is retired. She does not smoke and drinks alcohol infrequently, about once a month and things caffeine limitation, 1 cup of coffee per day on average. She moved from [place] a few years ago. 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 She had cardiac workup and was also diagnosed with reflux disease. Her stress echocardiogram was unremarkable, she saw a cardiologist as well. Bedtime is late because her boyfriend works late. He is a Therapist, nutritional. She goes to bed around 1:30 AM but admits that by midnight or 12:30 AM she is falling asleep on the couch. Rise time is between 8 and 9, she has nocturia about once per average night and denies recurrent morning headaches. She was diagnosed with asthma as a child. She had exacerbation when she moved to [place], she developed more allergies. She lives with her boyfriend and they have an outdoor dog, she is allergic to dogs and cats. She has no children.  Her Past Medical History Is Significant For: Past  Medical History:  Diagnosis Date  . Allergy   . Arthritis   . Asthma   . Chicken pox   . Diverticulitis 2009    Her Past Surgical History Is Significant For: Past Surgical History:  Procedure Laterality Date  . ABDOMINAL HYSTERECTOMY  2004   partial; left cervix and ovaries  . ANTERIOR CRUCIATE LIGAMENT REPAIR Right 2000    Her Family History Is Significant For: Family History  Problem Relation Age of Onset  . Alzheimer's disease Mother   . Asthma Mother   . Diabetes Mother   . Hyperlipidemia Mother   . Hypertension Mother   . Stroke Mother   . Heart disease Father        CHF  . Diabetes Father   . Hyperlipidemia Father   . Alcohol abuse Brother   . Heart disease Brother   . Asthma Sister   . Cancer Sister        uterine  . Diabetes Sister   . Breast cancer Neg Hx     Her Social History Is Significant For: Social History   Socioeconomic History  . Marital status: Single    Spouse name: Not on file  . Number of children: Not on file  . Years of education: Not on file  . Highest education level: Not on file  Occupational History  . Not on file  Social Needs  . Financial resource strain: Not on file  . Food insecurity:    Worry: Not on file    Inability: Not on  file  . Transportation needs:    Medical: Not on file    Non-medical: Not on file  Tobacco Use  . Smoking status: Never Smoker  . Smokeless tobacco: Never Used  Substance and Sexual Activity  . Alcohol use: Yes    Alcohol/week: 0.0 standard drinks  . Drug use: No  . Sexual activity: Yes    Partners: Male    Birth control/protection: Surgical  Lifestyle  . Physical activity:    Days per week: Not on file    Minutes per session: Not on file  . Stress: Not on file  Relationships  . Social connections:    Talks on phone: Not on file    Gets together: Not on file    Attends religious service: Not on file    Active member of club or organization: Not on file    Attends meetings of clubs or  organizations: Not on file    Relationship status: Not on file  Other Topics Concern  . 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      Diet- gym, knee limits running, paddleboating. Interval training 3x per week.       Diet-regular       Her Allergies Are:  Allergies  Allergen Reactions  . Pantoprazole Sodium Shortness Of Breath  . Flagyl [Metronidazole]     Neck pain, spasm  . Tetracyclines & Related   . Breo Ellipta [Fluticasone Furoate-Vilanterol] Palpitations  :   Her Current Medications Are:  Outpatient Encounter Medications as of 10/20/2017  Medication Sig  . albuterol (VENTOLIN HFA) 108 (90 Base) MCG/ACT inhaler INHALE 2 PUFFS INTO THE LUNGS EVERY 4 (FOUR) HOURS AS NEEDED FOR WHEEZING OR SHORTNESS OF BREATH.  Marland Kitchen AMBULATORY NON FORMULARY MEDICATION Medication Name: Spacer to be used with Flovent inhaler.  . Cholecalciferol (VITAMIN D) 2000 units CAPS Take 1 capsule by mouth daily.  . ciclopirox (PENLAC) 8 % solution Apply topically at bedtime. Apply to nail/surrounding skin and daily over previous coat. Every 7 days remove with alcohol and continue.  . famotidine (PEPCID) 20 MG tablet Take 1 tablet (20 mg total) by mouth daily.  . fluticasone (FLOVENT HFA) 220 MCG/ACT inhaler Inhale 2 puffs into the lungs 2 (two) times daily.  Marland Kitchen ipratropium-albuterol (DUONEB) 0.5-2.5 (3) MG/3ML SOLN Take 3 mLs by nebulization every 8 (eight) hours as needed.  . Multiple Vitamins-Minerals (MULTIVITAMIN WOMEN 50+ PO) Take 1 tablet by mouth daily.  . Probiotic Product (SOLUBLE FIBER/PROBIOTICS PO) Take by mouth.  . TURMERIC PO Take 1 capsule by mouth daily.  . [DISCONTINUED] fluticasone furoate-vilanterol (BREO ELLIPTA) 100-25 MCG/INH AEPB INHALE 1 PUFF DAILY  . [DISCONTINUED] pantoprazole (PROTONIX) 40 MG tablet Take by mouth.   Facility-Administered Encounter Medications as of 10/20/2017  Medication   . ipratropium-albuterol (DUONEB) 0.5-2.5 (3) MG/3ML nebulizer solution 3 mL  :  Review of Systems:  Out of a complete 14 point review of systems, all are reviewed and negative with the exception of these symptoms as listed below: Review of Systems  Neurological:       Pt presents today to discuss her sleep. Pt has never had a sleep study and does not endorse snoring. Pt is complaining of a headache.  Epworth Sleepiness Scale 0= would never doze 1= slight chance of dozing 2= moderate chance of dozing 3= high chance of dozing  Sitting  and reading: 3 Watching TV: 3 Sitting inactive in a public place (ex. Theater or meeting): 0 As a passenger in a car for an hour without a break: 0 Lying down to rest in the afternoon: 3 Sitting and talking to someone: 0 Sitting quietly after lunch (no alcohol): 2 In a car, while stopped in traffic: 0 Total: 11     Objective:  Neurological Exam  Physical Exam Physical Examination:   Vitals:   10/20/17 1605  BP: 123/87  Pulse: 68    General Examination: The patient is a very pleasant 62 y.o. female in no acute distress. She appears well-developed and well-nourished and well groomed.   HEENT: Normocephalic, atraumatic, pupils are equal, round and reactive to light and accommodation. Corrective eyeglasses in place. Extraocular tracking is good without limitation to gaze excursion or nystagmus noted. Normal smooth pursuit is noted. Hearing is grossly intact. Face is symmetric with normal facial animation and normal facial sensation. Speech is clear with no dysarthria noted. There is no hypophonia. There is no lip, neck/head, jaw or voice tremor. Neck is supple with full range of passive and active motion. There are no carotid bruits on auscultation. Oropharynx exam reveals: mild mouth dryness, adequate dental hygiene and moderate airway crowding, due to smaller airway entry and larger/wider tongue. Mallampati is class III. Tonsils on the small side.  Tongue protrudes centrally and palate elevates symmetrically. She has a minimal overbite. Neck circumference is 14 inches.  Chest: Clear to auscultation without wheezing, rhonchi or crackles noted.  Heart: S1+S2+0, regular and normal without murmurs, rubs or gallops noted.   Abdomen: Soft, non-tender and non-distended with normal bowel sounds appreciated on auscultation.  Extremities: There is no pitting edema in the distal lower extremities bilaterally.   Skin: Warm and dry without trophic changes noted.  Musculoskeletal: exam reveals no obvious joint deformities, tenderness or joint swelling or erythema.   Neurologically:  Mental status: The patient is awake, alert and oriented in all 4 spheres. Her immediate and remote memory, attention, language skills and fund of knowledge are appropriate. There is no evidence of aphasia, agnosia, apraxia or anomia. Speech is clear with normal prosody and enunciation. Thought process is linear. Mood is normal and affect is normal.  Cranial nerves II - XII are as described above under HEENT exam. In addition: shoulder shrug is normal with equal shoulder height noted. Motor exam: Normal bulk, strength and tone is noted. There is no drift, tremor or rebound. Romberg is negative. Reflexes are 1+ throughout. Fine motor skills and coordination: grossly intact.  Cerebellar testing: No dysmetria or intention tremor. There is no truncal or gait ataxia.  Sensory exam: intact to light touch in the upper and lower extremities.  Gait, station and balance: She stands easily. No veering to one side is noted. No leaning to one side is noted. Posture is age-appropriate and stance is narrow based. Gait shows normal stride length and normal pace. No problems turning are noted. Tandem walk is unremarkable.   Assessment and Plan:  In summary, Lynn Peters is a very pleasant 62 y.o.-year old female with an underlying medical history of palpitations, PVCs, asthma, reflux disease,  allergies, arthritis and mildly overweight state, whose history and physical exam are concerning for obstructive sleep apnea (OSA). I had a long chat with the patient about my findings and the diagnosis of OSA, its prognosis and treatment options. We talked about medical treatments, surgical interventions and non-pharmacological approaches. I explained in particular the risks and ramifications  of untreated moderate to severe OSA, especially with respect to developing cardiovascular disease down the Road, including congestive heart failure, difficult to treat hypertension, cardiac arrhythmias, or stroke. Even type 2 diabetes has, in part, been linked to untreated OSA. Symptoms of untreated OSA include daytime sleepiness, memory problems, mood irritability and mood disorder such as depression and anxiety, lack of energy, as well as recurrent headaches, especially morning headaches. We talked about trying to maintain a healthy lifestyle in general, as well as the importance of weight control. I encouraged the patient to eat healthy, exercise daily and keep well hydrated, to keep a scheduled bedtime and wake time routine, to not skip any meals and eat healthy snacks in between meals. I advised the patient not to drive when feeling sleepy. I recommended the following at this time: sleep study with potential positive airway pressure titration. (We will score hypopneas at 3%).   I explained the sleep test procedure to the patient and also outlined possible surgical and non-surgical treatment options of OSA, including the use of a custom-made dental device (which would require a referral to a specialist dentist or oral surgeon), upper airway surgical options, such as pillar implants, radiofrequency surgery, tongue base surgery, and UPPP (which would involve a referral to an ENT surgeon). Rarely, jaw surgery such as mandibular advancement may be considered.  I also explained the CPAP treatment option to the patient, who  indicated that she would be willing to try CPAP if the need arises. I explained the importance of being compliant with PAP treatment, not only for insurance purposes but primarily to improve Her symptoms, and for the patient's long term health benefit, including to reduce Her cardiovascular risks. I answered all her questions today and the patient was in agreement. I plan to see her back after the sleep study is completed and encouraged her to call with any interim questions, concerns, problems or updates.   Thank you very much for allowing me to participate in the care of this nice patient. If I can be of any further assistance to you please do not hesitate to call me at [phone].  Sincerely,   Star Age, MD, PhD

## 2017-10-23 ENCOUNTER — Encounter

## 2017-10-23 ENCOUNTER — Ambulatory Visit: Payer: BLUE CROSS/BLUE SHIELD | Admitting: Cardiovascular Disease

## 2017-12-09 ENCOUNTER — Ambulatory Visit (INDEPENDENT_AMBULATORY_CARE_PROVIDER_SITE_OTHER): Payer: BLUE CROSS/BLUE SHIELD

## 2017-12-09 ENCOUNTER — Telehealth: Payer: Self-pay

## 2017-12-09 DIAGNOSIS — Z23 Encounter for immunization: Secondary | ICD-10-CM | POA: Diagnosis not present

## 2017-12-09 NOTE — Progress Notes (Signed)
Pt is here today to receive there flu shot given in LD pt tolerated well.

## 2017-12-09 NOTE — Telephone Encounter (Signed)
Pt would like to know if she needs a pneumonia shot since she has chronic asthma?   Last shot was given Pneumococcal 23   Sent to PCP

## 2017-12-10 NOTE — Telephone Encounter (Signed)
Call pt  She had pneumo 23 so she would not need another pneumonia vaccine until 62 yo.

## 2017-12-12 ENCOUNTER — Encounter: Payer: Self-pay | Admitting: Family

## 2017-12-12 NOTE — Telephone Encounter (Signed)
Pneumovax 23-8/27/18

## 2017-12-15 NOTE — Progress Notes (Signed)
Patient came in today to pick up custom made foot orthotics.  The goals were accomplished and the patient reported no dissatisfaction with said orthotics.  Patient was advised of breakin period and how to report any issues. 

## 2017-12-16 ENCOUNTER — Encounter: Payer: Self-pay | Admitting: Family

## 2017-12-17 NOTE — Telephone Encounter (Signed)
appt scheduled 12/18/17

## 2017-12-18 ENCOUNTER — Encounter: Payer: Self-pay | Admitting: Family Medicine

## 2017-12-18 ENCOUNTER — Ambulatory Visit: Payer: BLUE CROSS/BLUE SHIELD | Admitting: Family Medicine

## 2017-12-18 VITALS — BP 110/78 | HR 77 | Temp 97.7°F | Ht 65.0 in | Wt 159.2 lb

## 2017-12-18 DIAGNOSIS — R350 Frequency of micturition: Secondary | ICD-10-CM

## 2017-12-18 DIAGNOSIS — R10819 Abdominal tenderness, unspecified site: Secondary | ICD-10-CM | POA: Diagnosis not present

## 2017-12-18 LAB — POCT URINALYSIS DIPSTICK
Bilirubin, UA: NEGATIVE
Blood, UA: NEGATIVE
Glucose, UA: NEGATIVE
Ketones, UA: NEGATIVE
Leukocytes, UA: NEGATIVE
Nitrite, UA: NEGATIVE
Protein, UA: NEGATIVE
Spec Grav, UA: 1.01
Urobilinogen, UA: 0.2 U/dL
pH, UA: 6

## 2017-12-18 LAB — URINALYSIS, MICROSCOPIC ONLY
RBC / HPF: NONE SEEN (ref 0–?)
WBC UA: NONE SEEN (ref 0–?)

## 2017-12-18 MED ORDER — PHENAZOPYRIDINE HCL 200 MG PO TABS: 200.0000 mg | ORAL_TABLET | Freq: Three times a day (TID) | ORAL | 0 refills | Status: DC | PRN

## 2017-12-18 NOTE — Telephone Encounter (Signed)
mychart message sent

## 2017-12-18 NOTE — Progress Notes (Signed)
Subjective:    Patient ID: Lynn Peters, female    DOB: [birth year], 62 y.o.   MRN: [id number]  HPI  Presents to clinic c/o UTI symptoms for for 2 weeks.  Patient states she has cramping, heaviness in lower abdomen, urinary frequency.  Patient has taken a few doses of Azo and increase water which did seem to help symptoms for a few days, but then they returned.  Patient does report a past history of getting many UTIs a few years ago, but began taking a probiotic supplement and UTI symptoms went away.  Patient states in the past you had been prescribed a Premarin vaginal cream, but was fearful of this medication due to it having hormones and her family having a strong history of stroke.   Patient Active Problem List   Diagnosis Date Noted  . Arthritis 09/16/2017  . Hyperlipidemia LDL goal <160 08/30/2017  . Apneic episode 08/29/2017  . Atypical chest pain 08/29/2017  . Acquired bilateral flat feet 08/20/2017  . Onychomycosis 08/20/2017  . Reflux esophagitis 02/16/2017  . Acute left-sided low back pain with left-sided sciatica 09/16/2016  . Overweight (BMI 25.0-29.9) 08/15/2016  . Allergic rhinitis due to pollen 08/05/2016  . PVC (premature ventricular contraction) 03/29/2016  . Mild persistent asthma 10/04/2015  . Abnormal Pap smear of cervix 10/04/2015  . Preventative health care 02/28/2015  . Family history of diabetes mellitus 02/16/2015   Social History   Tobacco Use  . Smoking status: Never Smoker  . Smokeless tobacco: Never Used  Substance Use Topics  . Alcohol use: Yes    Alcohol/week: 0.0 standard drinks   Review of Systems   Constitutional: Negative for chills, fatigue and fever.  HENT: Negative for congestion, ear pain, sinus pain and sore throat.   Eyes: Negative.   Respiratory: Negative for cough, shortness of breath and wheezing.   Cardiovascular: Negative for chest pain, palpitations and leg swelling.  Gastrointestinal: +lower ABD cramping. Negative for  diarrhea, nausea and vomiting.  Genitourinary: + dysuria, frequency and urgency.  Musculoskeletal: Negative for arthralgias and myalgias.  Skin: Negative for color change, pallor and rash.  Neurological: Negative for syncope, light-headedness and headaches.  Psychiatric/Behavioral: The patient is not nervous/anxious.       Objective:   Physical Exam  Constitutional: She is oriented to person, place, and time. She appears well-nourished. No distress.  HENT:  Head: Normocephalic and atraumatic.  Eyes: Conjunctivae and EOM are normal. No scleral icterus.  Neck: No tracheal deviation present.  Cardiovascular: Normal rate and regular rhythm.  Pulmonary/Chest: Effort normal. No respiratory distress.  Abdominal: Soft. Bowel sounds are normal. She exhibits no distension. There is tenderness. There is no rebound and no guarding.  +suprapubic tenderness.   Musculoskeletal: She exhibits no edema.  Gait normal.   Neurological: She is alert and oriented to person, place, and time.  Skin: Skin is warm and dry. No pallor.  Psychiatric: She has a normal mood and affect. Her behavior is normal.  Nursing note and vitals reviewed.     Vitals:   12/18/17 1007  BP: 110/78  Pulse: 77  Temp: 97.7 F (36.5 C)  SpO2: 98%   Assessment & Plan:   Urinary frequency/suprapubic tenderness - urinalysis is unremarkable in clinic.  We will also send urinalysis for microscopic testing and urine culture due to patient's history of UTI.  Patient will take Pyridium 200 mg up to 3 times a day for the next 3 days, work on increasing water intake to see  if this helps resolve symptoms.  If microscopic and urine culture come back negative for any abnormalities we discussed options of possibly seeing a urologist or uro-gynecologist due to persistent urinary type pain.  Keep regular follow-up with PCP as scheduled, return to clinic sooner if any issues arise.  Patient aware she will be contacted with results of urine  testing.

## 2017-12-19 LAB — URINE CULTURE
MICRO NUMBER:: 91342504
Result:: NO GROWTH
SPECIMEN QUALITY:: ADEQUATE

## 2017-12-31 ENCOUNTER — Ambulatory Visit (INDEPENDENT_AMBULATORY_CARE_PROVIDER_SITE_OTHER): Payer: BLUE CROSS/BLUE SHIELD | Admitting: Neurology

## 2017-12-31 DIAGNOSIS — K219 Gastro-esophageal reflux disease without esophagitis: Secondary | ICD-10-CM

## 2017-12-31 DIAGNOSIS — G4710 Hypersomnia, unspecified: Secondary | ICD-10-CM

## 2017-12-31 DIAGNOSIS — R002 Palpitations: Secondary | ICD-10-CM

## 2017-12-31 DIAGNOSIS — G4719 Other hypersomnia: Secondary | ICD-10-CM

## 2017-12-31 DIAGNOSIS — E663 Overweight: Secondary | ICD-10-CM

## 2017-12-31 DIAGNOSIS — R0683 Snoring: Secondary | ICD-10-CM

## 2017-12-31 DIAGNOSIS — I493 Ventricular premature depolarization: Secondary | ICD-10-CM

## 2017-12-31 DIAGNOSIS — G4720 Circadian rhythm sleep disorder, unspecified type: Secondary | ICD-10-CM

## 2017-12-31 DIAGNOSIS — R9431 Abnormal electrocardiogram [ECG] [EKG]: Secondary | ICD-10-CM

## 2017-12-31 DIAGNOSIS — R351 Nocturia: Secondary | ICD-10-CM

## 2017-12-31 DIAGNOSIS — J4520 Mild intermittent asthma, uncomplicated: Secondary | ICD-10-CM

## 2018-01-06 ENCOUNTER — Telehealth: Payer: Self-pay

## 2018-01-06 NOTE — Telephone Encounter (Signed)
-----   Message from Star Age, MD sent at 01/06/2018  8:15 AM EST ----- Patient referred by Dr. Derrel Nip, seen by me on 10/20/17, diagnostic PSG on 12/31/17.   Please call and notify the patient that the recent sleep study did not show any significant obstructive sleep apnea with only mild intermittent snoring noted. She achieved all stages of sleep, did have some trouble falling asleep. O2 sats above 90% all night. EKG showed PVCs, which is not new. Please inform patient that she can FU with PCP as planned.  Thanks,  Star Age, MD, PhD [facility])

## 2018-01-06 NOTE — Telephone Encounter (Signed)
I called pt and explained her sleep study results. Pt is agreeable to following up with her PCP. Pt asked me to send a copy of these results to Dr. Derrel Nip. Pt verbalized understanding of results. Pt had no questions at this time but was encouraged to call back if questions arise.

## 2018-01-06 NOTE — Telephone Encounter (Signed)
I called pt to discus her sleep study results. No answer, left a message asking her to call me back.

## 2018-01-06 NOTE — Progress Notes (Signed)
Patient referred by Dr. Derrel Nip, seen by me on 10/20/17, diagnostic PSG on 12/31/17.   Please call and notify the patient that the recent sleep study did not show any significant obstructive sleep apnea with only mild intermittent snoring noted. She achieved all stages of sleep, did have some trouble falling asleep. O2 sats above 90% all night. EKG showed PVCs, which is not new. Please inform patient that she can FU with PCP as planned.  Thanks,  Star Age, MD, PhD [facility])

## 2018-01-06 NOTE — Telephone Encounter (Signed)
Pt returning RN's call.

## 2018-01-06 NOTE — Procedures (Signed)
PATIENT'S NAME:  Lynn Peters, Lynn Peters DOB:      12-20-1955      MR#:    [id number]     DATE OF RECORDING: 12/31/2017 REFERRING M.D.:  Deborra Medina, MD Study Performed:   Baseline Polysomnogram HISTORY: 62 year old woman with a medical history of palpitations, PVCs, asthma, reflux disease, allergies, arthritis and mildly overweight state, who reports snoring and daytime somnolence. Her Epworth sleepiness score is 11 out of 24, fatigue score is 31 out of 63. The patient endorsed the Epworth Sleepiness Scale at 11 points. The patient's weight 160 pounds with a height of 66 (inches), resulting in a BMI of 26.2 kg/m2. The patient's neck circumference measured 14 inches.  CURRENT MEDICATIONS: Ventolin, Vitamin D, [place], Pepcid, Flovent, Duoneb, Womens 50+ Multivitamins, Fiber/Probiotics   PROCEDURE:  This is a multichannel digital polysomnogram utilizing the Somnostar 11.2 system.  Electrodes and sensors were applied and monitored per AASM Specifications.   EEG, EOG, Chin and Limb EMG, were sampled at 200 Hz.  ECG, Snore and Nasal Pressure, Thermal Airflow, Respiratory Effort, CPAP Flow and Pressure, Oximetry was sampled at 50 Hz. Digital video and audio were recorded.      BASELINE STUDY  Lights Out was at 22:02 and Lights On at 05:01.  Total recording time (TRT) was 420 minutes, with a total sleep time (TST) of 300.5 minutes. The patient's sleep latency was 104.5 minutes, which is delayed. REM latency was 50.5 minutes. The sleep efficiency was 71.5%, which is reduced.     SLEEP ARCHITECTURE: WASO (Wake after sleep onset) was 46 minutes with mild sleep fragmentation noted. There were 3 minutes in Stage N1, 162 minutes Stage N2, 83 minutes Stage N3 and 52.5 minutes in Stage REM.  The percentage of Stage N1 was 1.%, Stage N2 was 53.9%, which is normal, Stage N3 was 27.6%, which is mildly increased, and Stage R (REM sleep) was 17.5%, which is near-normal. The arousals were noted as: 19 were spontaneous, 0 were  associated with PLMs, 1 were associated with respiratory events.  RESPIRATORY ANALYSIS:  There were a total of 1 respiratory events:  0 obstructive apneas, 0 central apneas and 0 mixed apneas with a total of 0 apneas and an apnea index (AI) of 0 /hour. There were 1 hypopneas with a hypopnea index of .2 /hour. The patient also had 0 respiratory event related arousals (RERAs).      The total APNEA/HYPOPNEA INDEX (AHI) was .2 /hour and the total RESPIRATORY DISTURBANCE INDEX was .2 /hour.  1 events occurred in REM sleep and 0 events in NREM. The REM AHI was 1.1 /hour, versus a non-REM AHI of 0. The patient spent 153.5 minutes of total sleep time in the supine position and 147 minutes in non-supine.. The supine AHI was 0.4 versus a non-supine AHI of 0.0.  OXYGEN SATURATION & C02:  The Wake baseline 02 saturation was 96%, with the lowest being 91%. Time spent below 89% saturation equaled 0 minutes.  PERIODIC LIMB MOVEMENTS: The patient had a total of 0 Periodic Limb Movements.  The Periodic Limb Movement (PLM) index was 0 and the PLM Arousal index was 0/hour.  Audio and video analysis did not show any abnormal or unusual movements, behaviors, phonations or vocalizations. The patient took no bathroom breaks. Mild intermittent snoring was noted. The EKG showed occasional PVCs.   Post-study, the patient indicated that sleep was worse than usual.   IMPRESSION:  1. Primary Snoring 2. Dysfunctions associated with sleep stages or arousal from sleep 3.  Non-specific abnormal EKG  RECOMMENDATIONS:  1. This study does not demonstrate any significant obstructive or central sleep disordered breathing, with only mild intermittent snoring noted. This study does not support an intrinsic sleep disorder as a cause of the patient's symptoms. Other causes, including circadian rhythm disturbances, an underlying mood disorder, medication effect and/or an underlying medical problem cannot be ruled out. 2. The study  showed occasional PVCs on single lead EKG; clinical correlation is recommended.  3. This study shows sleep fragmentation and abnormal sleep stage percentages; these are nonspecific findings and per se do not signify an intrinsic sleep disorder or a cause for the patient's sleep-related symptoms. Causes include (but are not limited to) the first night effect of the sleep study, circadian rhythm disturbances, medication effect or an underlying mood disorder or medical problem.  4. The patient should be cautioned not to drive, work at heights, or operate dangerous or heavy equipment when tired or sleepy. Review and reiteration of good sleep hygiene measures should be pursued with any patient. 5. The patient will be advised to follow up with the referring provider, who will be notified of the test results.  I certify that I have reviewed the entire raw data recording prior to the issuance of this report in accordance with the Standards of Accreditation of the American Academy of Sleep Medicine (AASM)    Star Age, MD, PhD Diplomat, American Board of Neurology and Sleep Medicine (Neurology and Sleep Medicine)

## 2018-05-27 ENCOUNTER — Other Ambulatory Visit: Payer: Self-pay

## 2018-05-27 ENCOUNTER — Encounter: Payer: Self-pay | Admitting: Family

## 2018-05-27 ENCOUNTER — Other Ambulatory Visit: Payer: Self-pay | Admitting: Internal Medicine

## 2018-05-27 MED ORDER — FLUTICASONE PROPIONATE HFA 220 MCG/ACT IN AERO: 2.0000 | INHALATION_SPRAY | Freq: Two times a day (BID) | RESPIRATORY_TRACT | 0 refills | Status: DC

## 2018-05-27 MED ORDER — FLUTICASONE PROPIONATE HFA 220 MCG/ACT IN AERO: 2.0000 | INHALATION_SPRAY | Freq: Two times a day (BID) | RESPIRATORY_TRACT | 4 refills | Status: DC

## 2018-05-27 NOTE — Telephone Encounter (Signed)
Pt calling to check status of refill request.  Pt states she has no more ans with her allergy issues, she is concerned.  Pt would like to get one of the local  [id number] [address], [place] (Phone) [phone] (Fax)   Then the other 90 day supply to mail order as requested.

## 2018-05-27 NOTE — Telephone Encounter (Deleted)
Pt calling to check status of refill request.  Pt states she has no more ans with her allergy issues, she is concerned.

## 2018-06-05 ENCOUNTER — Other Ambulatory Visit: Payer: Self-pay

## 2018-06-05 MED ORDER — FLUTICASONE PROPIONATE HFA 220 MCG/ACT IN AERO: 2.0000 | INHALATION_SPRAY | Freq: Two times a day (BID) | RESPIRATORY_TRACT | 4 refills | Status: DC

## 2018-06-08 ENCOUNTER — Telehealth: Payer: Self-pay | Admitting: *Deleted

## 2018-06-08 ENCOUNTER — Other Ambulatory Visit: Payer: Self-pay

## 2018-06-08 MED ORDER — FLUTICASONE PROPIONATE HFA 220 MCG/ACT IN AERO: 2.0000 | INHALATION_SPRAY | Freq: Two times a day (BID) | RESPIRATORY_TRACT | 3 refills | Status: DC

## 2018-06-08 NOTE — Telephone Encounter (Signed)
I called & informed patient that script was faxed to [facility].

## 2018-06-08 NOTE — Telephone Encounter (Signed)
Copied from [id number] [phone]. Topic: General - Call Back - No Documentation >> Jun 08, 2018 12:44 PM Sheran Luz wrote: Reason for CRM: Patient states that she was speaking with Judson Roch earlier and has additional questions about the medication discussed. She would not give any other info. She is requesting a call back.

## 2018-06-08 NOTE — Telephone Encounter (Signed)
I spoke with patient & I stated that as soon as I got prescription signed that I would fax. I would call to let her know that this was done, so she could call & pay for her Flovent.

## 2018-06-24 ENCOUNTER — Encounter: Payer: Self-pay | Admitting: Family

## 2018-07-16 ENCOUNTER — Encounter: Payer: Self-pay | Admitting: Family

## 2018-07-23 IMAGING — US US BREAST*L* LIMITED INC AXILLA
1 series · 13 of 19 positions shown · non-contrast
Comparison: Previous exam(s).

CLINICAL DATA: The patient was called back for a left breast mass

EXAM:
2D DIGITAL DIAGNOSTIC LEFT MAMMOGRAM WITH ADJUNCT TOMO
ULTRASOUND LEFT BREAST

[Series 1: us breast*left* limited inc axilla · 0.05mm/px · 13 of 19 slices shown]
[im 1/19]
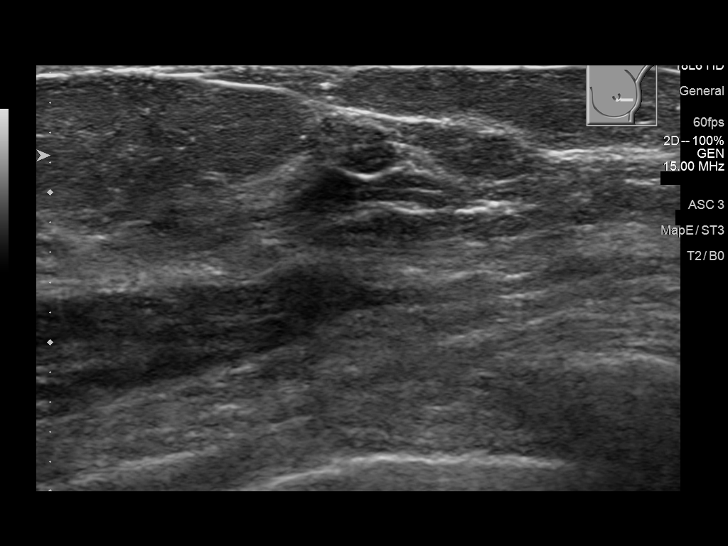
[im 3/19]
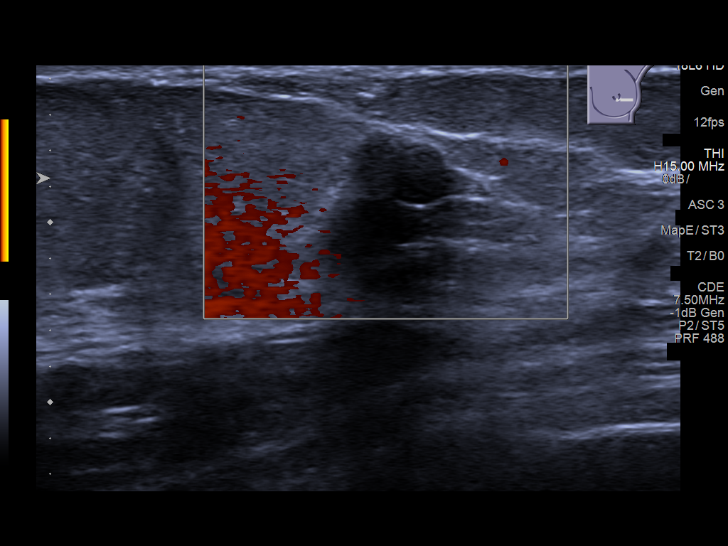
[im 4/19]
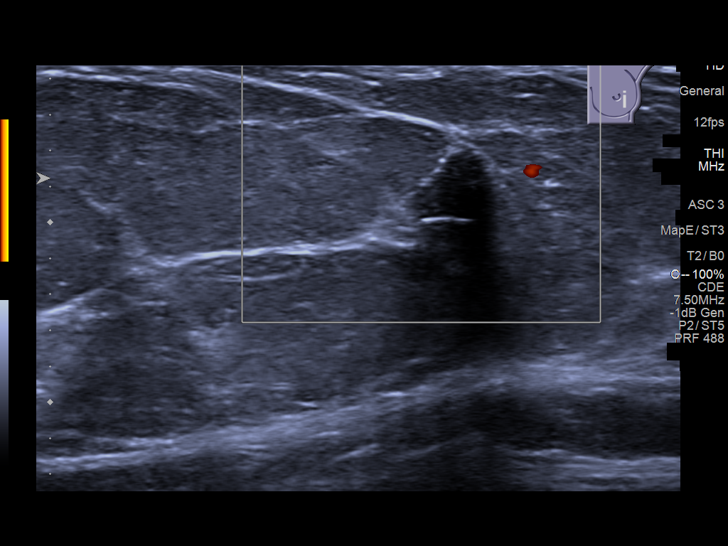
[im 6/19]
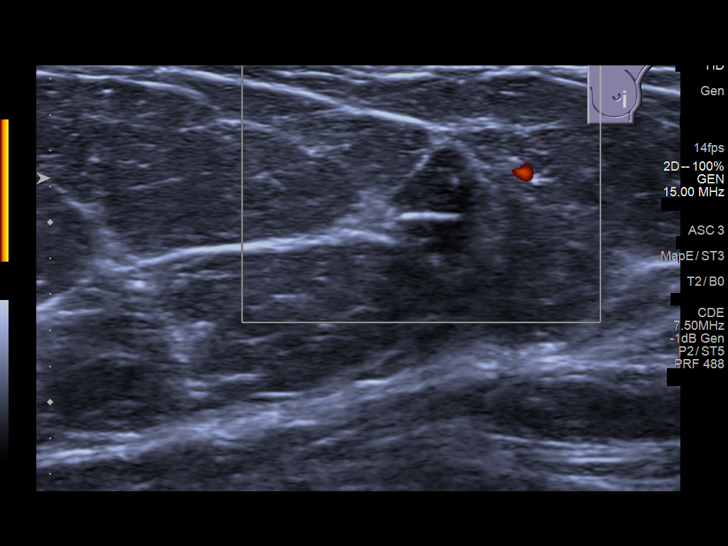
[im 7/19]
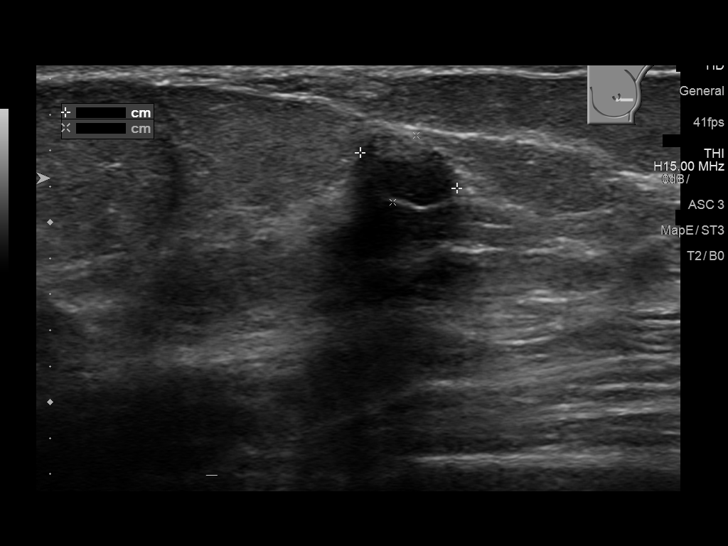
[im 9/19]
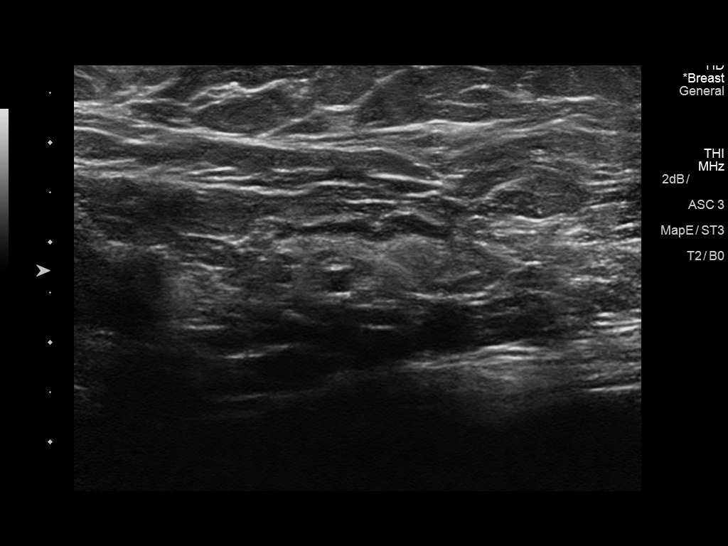
[im 10/19]
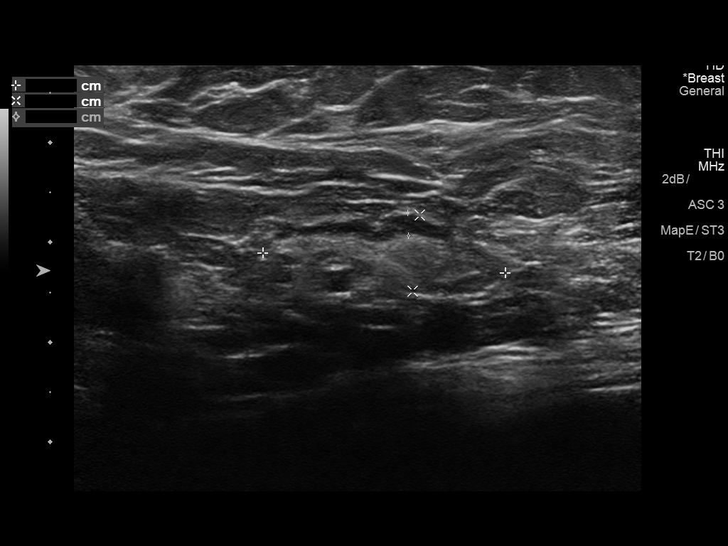
[im 11/19]
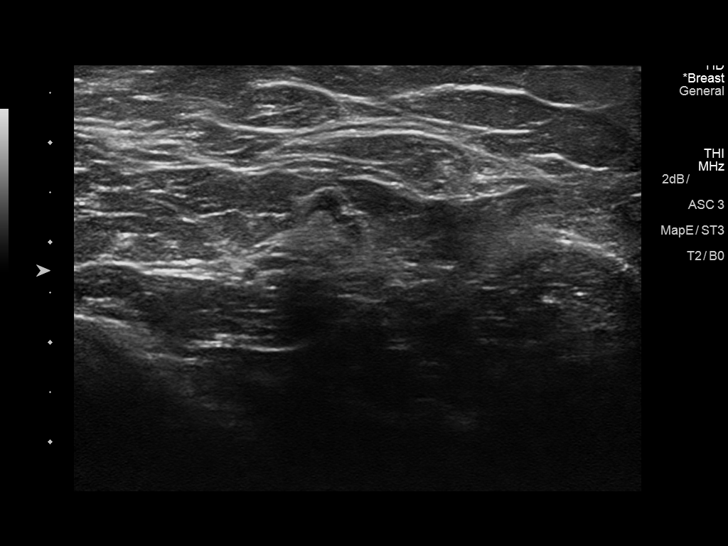
[im 13/19]
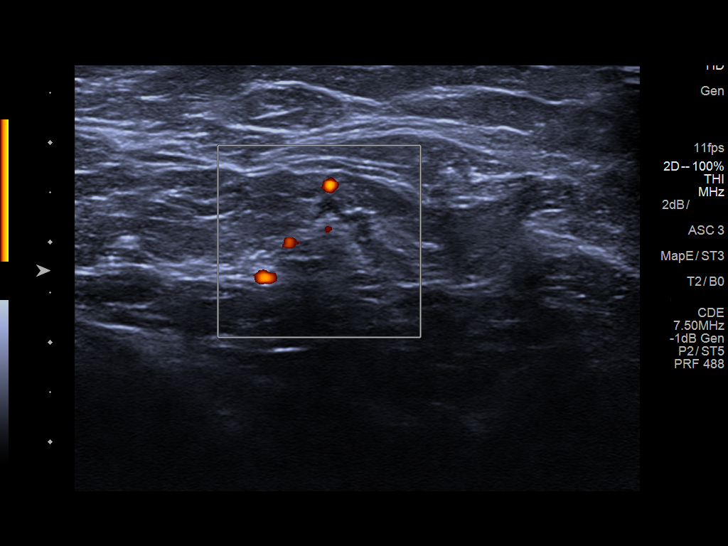
[im 14/19]
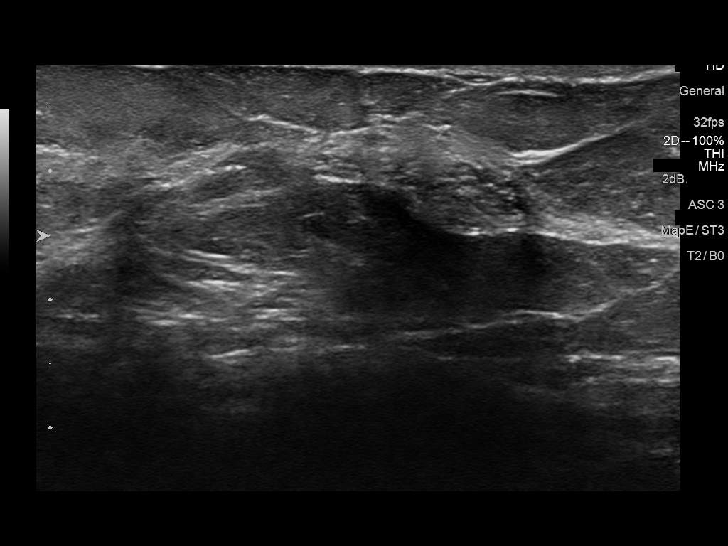
[im 16/19]
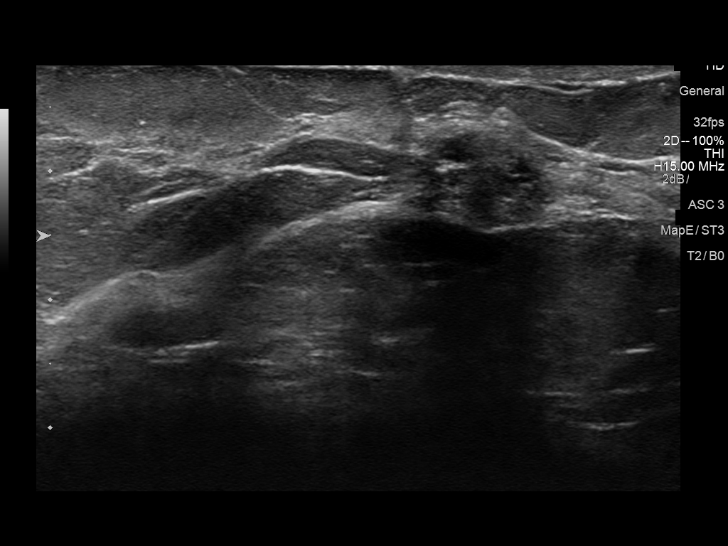
[im 17/19]
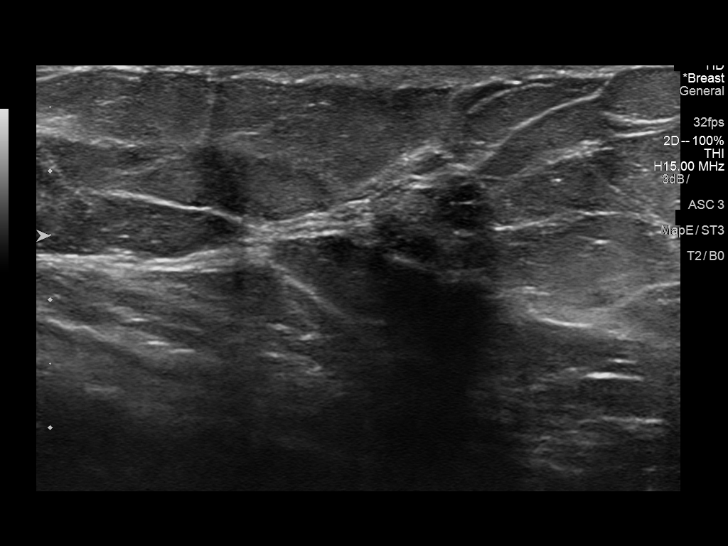
[im 19/19]
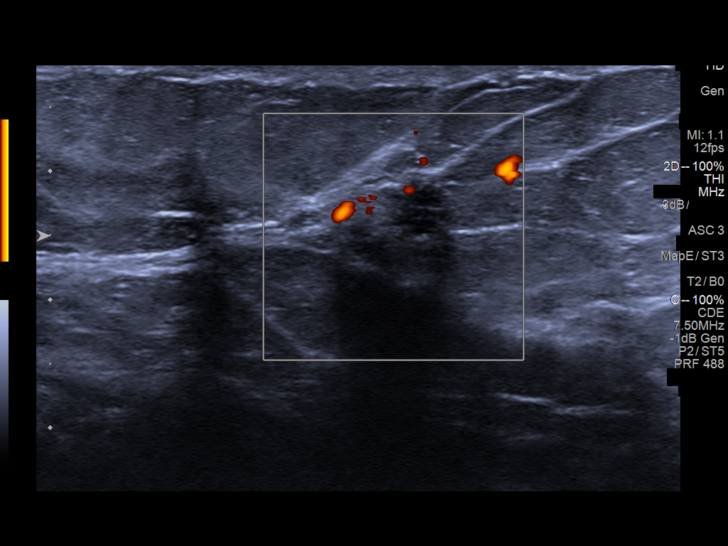

[13 of 19 positions shown; findings below may reference images not displayed]

ACR Breast Density Category b: There are scattered areas of
fibroglandular density.
FINDINGS: There is a mass in the left breast at 6 or 7 o'clock. In retrospect,
this has been present since at least 2539 without significant change
and has probably been present since 5118. Also, there is a mass at 3
o'clock in the left breast which has been stable for many years as
well.

On physical exam, no suspicious lumps.

Targeted ultrasound is performed, showing a mass at 6 o'clock in the
subareolar region measuring 11 by 11 by 7 mm with apparent cystic
components. This correlates with the mass for which the patient was
called back. A second mass is seen at 3 o'clock, 3 cm from the
nipple measuring 6 x 6 x 4 mm correlating with the stable mass seen
more laterally in the breast.
IMPRESSION: Probably benign left breast mass at 6 o'clock in the left breast.

RECOMMENDATION:
Six-month follow-up mammogram and ultrasound to ensure stability of
the probably benign mass.

I have discussed the findings and recommendations with the patient.
Results were also provided in writing at the conclusion of the
visit. If applicable, a reminder letter will be sent to the patient
regarding the next appointment.

BI-RADS CATEGORY  3: Probably benign.

## 2018-10-15 ENCOUNTER — Encounter: Payer: Self-pay | Admitting: Family

## 2018-10-16 ENCOUNTER — Ambulatory Visit (INDEPENDENT_AMBULATORY_CARE_PROVIDER_SITE_OTHER): Payer: BC Managed Care – PPO | Admitting: Family

## 2018-10-16 ENCOUNTER — Other Ambulatory Visit: Payer: Self-pay

## 2018-10-16 ENCOUNTER — Encounter: Payer: Self-pay | Admitting: Family

## 2018-10-16 VITALS — BP 102/70 | HR 81 | Temp 97.4°F | Ht 65.75 in | Wt 164.4 lb

## 2018-10-16 DIAGNOSIS — Z23 Encounter for immunization: Secondary | ICD-10-CM | POA: Diagnosis not present

## 2018-10-16 DIAGNOSIS — J4530 Mild persistent asthma, uncomplicated: Secondary | ICD-10-CM | POA: Diagnosis not present

## 2018-10-16 DIAGNOSIS — R300 Dysuria: Secondary | ICD-10-CM | POA: Diagnosis not present

## 2018-10-16 LAB — POCT URINALYSIS DIPSTICK
Bilirubin, UA: NEGATIVE
Blood, UA: NEGATIVE
Glucose, UA: NEGATIVE
Ketones, UA: NEGATIVE
Leukocytes, UA: NEGATIVE
Nitrite, UA: NEGATIVE
Protein, UA: NEGATIVE
Spec Grav, UA: 1.01 (ref 1.010–1.025)
Urobilinogen, UA: 0.2 E.U./dL
pH, UA: 6 (ref 5.0–8.0)

## 2018-10-16 LAB — URINALYSIS, MICROSCOPIC ONLY: RBC / HPF: NONE SEEN (ref 0–?)

## 2018-10-16 MED ORDER — NITROFURANTOIN MONOHYD MACRO 100 MG PO CAPS: 100.0000 mg | ORAL_CAPSULE | Freq: Two times a day (BID) | ORAL | 0 refills | Status: DC

## 2018-10-16 MED ORDER — FLUTICASONE PROPIONATE HFA 220 MCG/ACT IN AERO: 2.0000 | INHALATION_SPRAY | Freq: Two times a day (BID) | RESPIRATORY_TRACT | 3 refills | Status: DC

## 2018-10-16 MED ORDER — ALBUTEROL SULFATE HFA 108 (90 BASE) MCG/ACT IN AERS: INHALATION_SPRAY | RESPIRATORY_TRACT | 2 refills | Status: DC

## 2018-10-16 NOTE — Patient Instructions (Signed)
Start macrobid  [place] of water  Schedule annual physical as soon as you can.   Stay safe!   Urinary Tract Infection, Adult  A urinary tract infection (UTI) is an infection of any part of the urinary tract. The urinary tract includes the kidneys, ureters, bladder, and urethra. These organs make, store, and get rid of urine in the body. Your health care provider may use other names to describe the infection. An upper UTI affects the ureters and kidneys (pyelonephritis). A lower UTI affects the bladder (cystitis) and urethra (urethritis). What are the causes? Most urinary tract infections are caused by bacteria in your genital area, around the entrance to your urinary tract (urethra). These bacteria grow and cause inflammation of your urinary tract. What increases the risk? You are more likely to develop this condition if:  You have a urinary catheter that stays in place (indwelling).  You are not able to control when you urinate or have a bowel movement (you have incontinence).  You are female and you: ? Use a spermicide or diaphragm for birth control. ? Have low estrogen levels. ? Are pregnant.  You have certain genes that increase your risk (genetics).  You are sexually active.  You take antibiotic medicines.  You have a condition that causes your flow of urine to slow down, such as: ? An enlarged prostate, if you are female. ? Blockage in your urethra (stricture). ? A kidney stone. ? A nerve condition that affects your bladder control (neurogenic bladder). ? Not getting enough to drink, or not urinating often.  You have certain medical conditions, such as: ? Diabetes. ? A weak disease-fighting system (immunesystem). ? Sickle cell disease. ? Gout. ? Spinal cord injury. What are the signs or symptoms? Symptoms of this condition include:  Needing to urinate right away (urgently).  Frequent urination or passing small amounts of urine frequently.  Pain or burning with  urination.  Blood in the urine.  Urine that smells bad or unusual.  Trouble urinating.  Cloudy urine.  Vaginal discharge, if you are female.  Pain in the abdomen or the lower back. You may also have:  Vomiting or a decreased appetite.  Confusion.  Irritability or tiredness.  A fever.  Diarrhea. The first symptom in older adults may be confusion. In some cases, they may not have any symptoms until the infection has worsened. How is this diagnosed? This condition is diagnosed based on your medical history and a physical exam. You may also have other tests, including:  Urine tests.  Blood tests.  Tests for sexually transmitted infections (STIs). If you have had more than one UTI, a cystoscopy or imaging studies may be done to determine the cause of the infections. How is this treated? Treatment for this condition includes:  Antibiotic medicine.  Over-the-counter medicines to treat discomfort.  Drinking enough water to stay hydrated. If you have frequent infections or have other conditions such as a kidney stone, you may need to see a health care provider who specializes in the urinary tract (urologist). In rare cases, urinary tract infections can cause sepsis. Sepsis is a life-threatening condition that occurs when the body responds to an infection. Sepsis is treated in the hospital with IV antibiotics, fluids, and other medicines. Follow these instructions at home:  Medicines  Take over-the-counter and prescription medicines only as told by your health care provider.  If you were prescribed an antibiotic medicine, take it as told by your health care provider. Do not stop using  the antibiotic even if you start to feel better. General instructions  Make sure you: ? Empty your bladder often and completely. Do not hold urine for long periods of time. ? Empty your bladder after sex. ? Wipe from front to back after a bowel movement if you are female. Use each tissue  one time when you wipe.  Drink enough fluid to keep your urine pale yellow.  Keep all follow-up visits as told by your health care provider. This is important. Contact a health care provider if:  Your symptoms do not get better after 1-2 days.  Your symptoms go away and then return. Get help right away if you have:  Severe pain in your back or your lower abdomen.  A fever.  Nausea or vomiting. Summary  A urinary tract infection (UTI) is an infection of any part of the urinary tract, which includes the kidneys, ureters, bladder, and urethra.  Most urinary tract infections are caused by bacteria in your genital area, around the entrance to your urinary tract (urethra).  Treatment for this condition often includes antibiotic medicines.  If you were prescribed an antibiotic medicine, take it as told by your health care provider. Do not stop using the antibiotic even if you start to feel better.  Keep all follow-up visits as told by your health care provider. This is important. This information is not intended to replace advice given to you by your health care provider. Make sure you discuss any questions you have with your health care provider. Document Released: 11/07/2004 Document Revised: 01/15/2018 Document Reviewed: 08/07/2017 Elsevier Patient Education  2020 [employer].

## 2018-10-16 NOTE — Assessment & Plan Note (Signed)
Appears at baseline; refilled inhalers

## 2018-10-16 NOTE — Assessment & Plan Note (Signed)
UA dip is negative leukocytes, nitrates, and blood . Due to patient preference, we will start antibiotic therapy ahead of culture.  Of note: CPE scheduled for next week.

## 2018-10-16 NOTE — Progress Notes (Signed)
Subjective:    Patient ID: Lynn Peters, female    DOB: 07-31-1955, 63 y.o.   MRN: [id number]  CC: Lynn Peters is a 63 y.o. female who presents today for an acute visit.    HPI: CC: dysuria x one week, unchanged  Has been taking azo with little improvement  Endorses suprapubic 'discomfort' like cramping. No severe abdominal pain, bloating, constipation, N, v, fever, hematuria, flank pain.   Asthma has been at baseline and controlled with zyrtec. Would like refills of inhalers.    Overdue for cpe,pap, mammogram   HISTORY:  Past Medical History:  Diagnosis Date  . Allergy   . Arthritis   . Asthma   . Chicken pox   . Diverticulitis 2009   Past Surgical History:  Procedure Laterality Date  . ABDOMINAL HYSTERECTOMY  2004   partial; left cervix and ovaries  . ANTERIOR CRUCIATE LIGAMENT REPAIR Right 2000   Family History  Problem Relation Age of Onset  . Alzheimer's disease Mother   . Asthma Mother   . Diabetes Mother   . Hyperlipidemia Mother   . Hypertension Mother   . Stroke Mother   . Heart disease Father        CHF  . Diabetes Father   . Hyperlipidemia Father   . Alcohol abuse Brother   . Heart disease Brother   . Asthma Sister   . Cancer Sister        uterine  . Diabetes Sister   . Breast cancer Neg Hx     Allergies: Pantoprazole sodium, Flagyl [metronidazole], Tetracyclines & related, and Breo ellipta [fluticasone furoate-vilanterol] Current Outpatient Medications on File Prior to Visit  Medication Sig Dispense Refill  . AMBULATORY NON FORMULARY MEDICATION Medication Name: Spacer to be used with Flovent inhaler. 1 each 0  . cetirizine HCl (ZYRTEC CHILDRENS ALLERGY) 5 MG/5ML SOLN Take 2.5 mg by mouth daily.    . famotidine (PEPCID) 20 MG tablet Take 1 tablet (20 mg total) by mouth daily. 30 tablet 2  . ipratropium-albuterol (DUONEB) 0.5-2.5 (3) MG/3ML SOLN Take 3 mLs by nebulization every 8 (eight) hours as needed. 360 mL 11  . phenazopyridine (PYRIDIUM) 200  MG tablet Take 1 tablet (200 mg total) by mouth 3 (three) times daily as needed for pain. 10 tablet 0  . Probiotic Product (SOLUBLE FIBER/PROBIOTICS PO) Take by mouth.     Current Facility-Administered Medications on File Prior to Visit  Medication Dose Route Frequency Provider Last Rate Last Dose  . ipratropium-albuterol (DUONEB) 0.5-2.5 (3) MG/3ML nebulizer solution 3 mL  3 mL Nebulization Q6H Arnett, Yvetta Coder, FNP        Social History   Tobacco Use  . Smoking status: Never Smoker  . Smokeless tobacco: Never Used  Substance Use Topics  . Alcohol use: Yes    Alcohol/week: 0.0 standard drinks  . Drug use: No    Review of Systems  Constitutional: Negative for chills and fever.  Respiratory: Negative for cough.   Cardiovascular: Negative for chest pain and palpitations.  Gastrointestinal: Negative for abdominal pain, constipation, nausea and vomiting.  Genitourinary: Positive for dysuria. Negative for decreased urine volume, difficulty urinating and hematuria.      Objective:    BP 102/70   Pulse 81   Temp (!) 97.4 F (36.3 C)   Ht 5' 5.75" (1.67 m)   Wt 164 lb 6.4 oz (74.6 kg)   SpO2 96%   BMI 26.74 kg/m    Physical Exam Vitals signs reviewed.  Constitutional:      Appearance: She is well-developed.  Cardiovascular:     Rate and Rhythm: Normal rate and regular rhythm.     Pulses: Normal pulses.     Heart sounds: Normal heart sounds.  Pulmonary:     Effort: Pulmonary effort is normal.     Breath sounds: Normal breath sounds. No wheezing, rhonchi or rales.  Skin:    General: Skin is warm and dry.  Neurological:     Mental Status: She is alert.  Psychiatric:        Speech: Speech normal.        Behavior: Behavior normal.        Thought Content: Thought content normal.        Assessment & Plan:   Problem List Items Addressed This Visit      Respiratory   Mild persistent asthma    Appears at baseline; refilled inhalers      Relevant Medications    fluticasone (FLOVENT HFA) 220 MCG/ACT inhaler   albuterol (VENTOLIN HFA) 108 (90 Base) MCG/ACT inhaler     Other   Dysuria - Primary    UA dip is negative leukocytes, nitrates, and blood . Due to patient preference, we will start antibiotic therapy ahead of culture.  Of note: CPE scheduled for next week.      Relevant Medications   nitrofurantoin, macrocrystal-monohydrate, (MACROBID) 100 MG capsule   Other Relevant Orders   POCT Urinalysis Dipstick (Completed)   Urine Microscopic Only   Urine Culture    Other Visit Diagnoses    Need for immunization against influenza       Relevant Orders   Flu Vaccine QUAD 36+ mos IM (Completed)        I have discontinued Lynn Peters's ciclopirox. I am also having her start on nitrofurantoin (macrocrystal-monohydrate). Additionally, I am having her maintain her Probiotic Product (SOLUBLE FIBER/PROBIOTICS PO), ipratropium-albuterol, AMBULATORY NON FORMULARY MEDICATION, famotidine, phenazopyridine, cetirizine HCl, fluticasone, and albuterol. We will continue to administer ipratropium-albuterol.   Meds ordered this encounter  Medications  . nitrofurantoin, macrocrystal-monohydrate, (MACROBID) 100 MG capsule    Sig: Take 1 capsule (100 mg total) by mouth 2 (two) times daily. Take with food.    Dispense:  10 capsule    Refill:  0    Order Specific Question:   Supervising Provider    Answer:   Lynn Peters [[id number]]  . fluticasone (FLOVENT HFA) 220 MCG/ACT inhaler    Sig: Inhale 2 puffs into the lungs 2 (two) times daily.    Dispense:  3 Inhaler    Refill:  3    For future use.    Order Specific Question:   Supervising Provider    Answer:   Lynn Peters [[id number]]  . albuterol (VENTOLIN HFA) 108 (90 Base) MCG/ACT inhaler    Sig: INHALE 2 PUFFS INTO THE LUNGS EVERY 4 (FOUR) HOURS AS NEEDED FOR WHEEZING OR SHORTNESS OF BREATH.    Dispense:  18 g    Refill:  2    Order Specific Question:   Supervising Provider    Answer:   Lynn Peters [[id number]]     Return precautions given.   Risks, benefits, and alternatives of the medications and treatment plan prescribed today were discussed, and patient expressed understanding.   Education regarding symptom management and diagnosis given to patient on AVS.  Continue to follow with Lynn Hawthorne, FNP for routine health maintenance.   Lavena Bullion and I agreed with plan.  Mable Paris, FNP

## 2018-10-18 LAB — URINE CULTURE
MICRO NUMBER:: 849479
Result:: NO GROWTH
SPECIMEN QUALITY:: ADEQUATE

## 2018-10-19 ENCOUNTER — Encounter: Payer: Self-pay | Admitting: Family

## 2018-10-23 ENCOUNTER — Other Ambulatory Visit (HOSPITAL_COMMUNITY)
Admission: RE | Admit: 2018-10-23 | Discharge: 2018-10-23 | Disposition: A | Discharge status: Home / Self Care | Payer: BC Managed Care – PPO | Source: Ambulatory Visit | Attending: Family | Admitting: Family

## 2018-10-23 ENCOUNTER — Encounter: Payer: Self-pay | Admitting: Family

## 2018-10-23 ENCOUNTER — Ambulatory Visit
Admission: RE | Admit: 2018-10-23 | Discharge: 2018-10-23 | Disposition: A | Discharge status: Home / Self Care | Payer: BC Managed Care – PPO | Source: Ambulatory Visit | Attending: Family | Admitting: Family

## 2018-10-23 ENCOUNTER — Other Ambulatory Visit: Payer: Self-pay

## 2018-10-23 ENCOUNTER — Ambulatory Visit (INDEPENDENT_AMBULATORY_CARE_PROVIDER_SITE_OTHER): Payer: BC Managed Care – PPO | Admitting: Family

## 2018-10-23 ENCOUNTER — Telehealth: Payer: Self-pay | Admitting: Family Medicine

## 2018-10-23 VITALS — BP 102/64 | HR 76 | Temp 97.9°F | Ht 65.95 in | Wt 161.0 lb

## 2018-10-23 DIAGNOSIS — R109 Unspecified abdominal pain: Secondary | ICD-10-CM

## 2018-10-23 DIAGNOSIS — R1032 Left lower quadrant pain: Secondary | ICD-10-CM | POA: Insufficient documentation

## 2018-10-23 DIAGNOSIS — Z0000 Encounter for general adult medical examination without abnormal findings: Secondary | ICD-10-CM | POA: Diagnosis not present

## 2018-10-23 DIAGNOSIS — J4530 Mild persistent asthma, uncomplicated: Secondary | ICD-10-CM

## 2018-10-23 LAB — CBC WITH DIFFERENTIAL/PLATELET
Basophils Absolute: 0.1 10*3/uL (ref 0.0–0.1)
Basophils Relative: 0.7 % (ref 0.0–3.0)
Eosinophils Absolute: 0.1 10*3/uL (ref 0.0–0.7)
Eosinophils Relative: 0.9 % (ref 0.0–5.0)
HCT: 46.7 % — ABNORMAL HIGH (ref 36.0–46.0)
Hemoglobin: 15.5 g/dL — ABNORMAL HIGH (ref 12.0–15.0)
Lymphocytes Relative: 29.4 % (ref 12.0–46.0)
Lymphs Abs: 2.9 10*3/uL (ref 0.7–4.0)
MCHC: 33.1 g/dL (ref 30.0–36.0)
MCV: 92 fl (ref 78.0–100.0)
Monocytes Absolute: 0.7 10*3/uL (ref 0.1–1.0)
Monocytes Relative: 7.1 % (ref 3.0–12.0)
Neutro Abs: 6 10*3/uL (ref 1.4–7.7)
Neutrophils Relative %: 61.9 % (ref 43.0–77.0)
Platelets: 347 10*3/uL (ref 150.0–400.0)
RBC: 5.07 Mil/uL (ref 3.87–5.11)
RDW: 13.3 % (ref 11.5–15.5)
WBC: 9.7 10*3/uL (ref 4.0–10.5)

## 2018-10-23 LAB — LIPID PANEL
Cholesterol: 219 mg/dL — ABNORMAL HIGH (ref 0–200)
HDL: 56.6 mg/dL (ref >39.00)
LDL Cholesterol: 138 mg/dL — ABNORMAL HIGH (ref 0–99)
NonHDL: 162.51
Total CHOL/HDL Ratio: 4
Triglycerides: 122 mg/dL (ref 0.0–149.0)
VLDL: 24.4 mg/dL (ref 0.0–40.0)

## 2018-10-23 LAB — COMPREHENSIVE METABOLIC PANEL
ALT: 25 U/L (ref 0–35)
AST: 23 U/L (ref 0–37)
Albumin: 4 g/dL (ref 3.5–5.2)
Alkaline Phosphatase: 71 U/L (ref 39–117)
BUN: 9 mg/dL (ref 6–23)
CO2: 29 mEq/L (ref 19–32)
Calcium: 9.6 mg/dL (ref 8.4–10.5)
Chloride: 102 mEq/L (ref 96–112)
Creatinine, Ser: 0.77 mg/dL (ref 0.40–1.20)
GFR: 75.56 mL/min (ref >60.00)
Glucose, Bld: 81 mg/dL (ref 70–99)
Potassium: 3.8 mEq/L (ref 3.5–5.1)
Sodium: 140 mEq/L (ref 135–145)
Total Bilirubin: 0.8 mg/dL (ref 0.2–1.2)
Total Protein: 6.9 g/dL (ref 6.0–8.3)

## 2018-10-23 LAB — POCT I-STAT CREATININE: Creatinine, Ser: 0.8 mg/dL (ref 0.44–1.00)

## 2018-10-23 LAB — HEMOGLOBIN A1C: Hgb A1c MFr Bld: 5.7 % (ref 4.6–6.5)

## 2018-10-23 LAB — VITAMIN D 25 HYDROXY (VIT D DEFICIENCY, FRACTURES): VITD: 33.77 ng/mL (ref 30.00–100.00)

## 2018-10-23 LAB — TSH: TSH: 1.8 u[IU]/mL (ref 0.35–4.50)

## 2018-10-23 MED ORDER — IOHEXOL 300 MG/ML  SOLN
100.0000 mL | Freq: Once | INTRAMUSCULAR | Status: AC | PRN
  Administered 2018-10-23: 100 mL via INTRAVENOUS

## 2018-10-23 NOTE — Assessment & Plan Note (Signed)
Clinical breast exam and Pap smear performed.  Patient will schedule mammogram.

## 2018-10-23 NOTE — Patient Instructions (Signed)
CT Abdomen and pelvis today; please keep phone with you so we can discuss results.   Today we discussed referrals, orders. colonoscopy   I have placed these orders in the system for you.  Please be sure to give [place] a call if you have not heard from our office regarding this. We should hear from [place] within ONE week with information regarding your appointment. If not, please let me know immediately.    Please call call and schedule your 3D mammogram as discussed.   [place]  [place], [place]   Stay safe!   Health Maintenance for Postmenopausal Women Menopause is a normal process in which your ability to get pregnant comes to an end. This process happens slowly over many months or years, usually between the ages of 38 and 13. Menopause is complete when you have missed your menstrual periods for 12 months. It is important to talk with your health care provider about some of the most common conditions that affect women after menopause (postmenopausal women). These include heart disease, cancer, and bone loss (osteoporosis). Adopting a healthy lifestyle and getting preventive care can help to promote your health and wellness. The actions you take can also lower your chances of developing some of these common conditions. What should I know about menopause? During menopause, you may get a number of symptoms, such as:  Hot flashes. These can be moderate or severe.  Night sweats.  Decrease in sex drive.  Mood swings.  Headaches.  Tiredness.  Irritability.  Memory problems.  Insomnia. Choosing to treat or not to treat these symptoms is a decision that you make with your health care provider. Do I need hormone replacement therapy?  Hormone replacement therapy is effective in treating symptoms that are caused by menopause, such as hot flashes and night sweats.  Hormone replacement carries certain risks, especially as you become  older. If you are thinking about using estrogen or estrogen with progestin, discuss the benefits and risks with your health care provider. What is my risk for heart disease and stroke? The risk of heart disease, heart attack, and stroke increases as you age. One of the causes may be a change in the body's hormones during menopause. This can affect how your body uses dietary fats, triglycerides, and cholesterol. Heart attack and stroke are medical emergencies. There are many things that you can do to help prevent heart disease and stroke. Watch your blood pressure  High blood pressure causes heart disease and increases the risk of stroke. This is more likely to develop in people who have high blood pressure readings, are of African descent, or are overweight.  Have your blood pressure checked: ? Every 3-5 years if you are 53-73 years of age. ? Every year if you are 80 years old or older. Eat a healthy diet   Eat a diet that includes plenty of vegetables, fruits, low-fat dairy products, and lean protein.  Do not eat a lot of foods that are high in solid fats, added sugars, or sodium. Get regular exercise Get regular exercise. This is one of the most important things you can do for your health. Most adults should:  Try to exercise for at least 150 minutes each week. The exercise should increase your heart rate and make you sweat (moderate-intensity exercise).  Try to do strengthening exercises at least twice each week. Do these in addition to the moderate-intensity exercise.  Spend less time sitting. Even  light physical activity can be beneficial. Other tips  Work with your health care provider to achieve or maintain a healthy weight.  Do not use any products that contain nicotine or tobacco, such as cigarettes, e-cigarettes, and chewing tobacco. If you need help quitting, ask your health care provider.  Know your numbers. Ask your health care provider to check your cholesterol and your  blood sugar (glucose). Continue to have your blood tested as directed by your health care provider. Do I need screening for cancer? Depending on your health history and family history, you may need to have cancer screening at different stages of your life. This may include screening for:  Breast cancer.  Cervical cancer.  Lung cancer.  Colorectal cancer. What is my risk for osteoporosis? After menopause, you may be at increased risk for osteoporosis. Osteoporosis is a condition in which bone destruction happens more quickly than new bone creation. To help prevent osteoporosis or the bone fractures that can happen because of osteoporosis, you may take the following actions:  If you are 52-78 years old, get at least 1,000 mg of calcium and at least 600 mg of vitamin D per day.  If you are older than age 84 but younger than age 87, get at least 1,200 mg of calcium and at least 600 mg of vitamin D per day.  If you are older than age 72, get at least 1,200 mg of calcium and at least 800 mg of vitamin D per day. Smoking and drinking excessive alcohol increase the risk of osteoporosis. Eat foods that are rich in calcium and vitamin D, and do weight-bearing exercises several times each week as directed by your health care provider. How does menopause affect my mental health? Depression may occur at any age, but it is more common as you become older. Common symptoms of depression include:  Low or sad mood.  Changes in sleep patterns.  Changes in appetite or eating patterns.  Feeling an overall lack of motivation or enjoyment of activities that you previously enjoyed.  Frequent crying spells. Talk with your health care provider if you think that you are experiencing depression. General instructions See your health care provider for regular wellness exams and vaccines. This may include:  Scheduling regular health, dental, and eye exams.  Getting and maintaining your vaccines. These  include: ? Influenza vaccine. Get this vaccine each year before the flu season begins. ? Pneumonia vaccine. ? Shingles vaccine. ? Tetanus, diphtheria, and pertussis (Tdap) booster vaccine. Your health care provider may also recommend other immunizations. Tell your health care provider if you have ever been abused or do not feel safe at home. Summary  Menopause is a normal process in which your ability to get pregnant comes to an end.  This condition causes hot flashes, night sweats, decreased interest in sex, mood swings, headaches, or lack of sleep.  Treatment for this condition may include hormone replacement therapy.  Take actions to keep yourself healthy, including exercising regularly, eating a healthy diet, watching your weight, and checking your blood pressure and blood sugar levels.  Get screened for cancer and depression. Make sure that you are up to date with all your vaccines. This information is not intended to replace advice given to you by your health care provider. Make sure you discuss any questions you have with your health care provider. Document Released: 03/22/2005 Document Revised: 01/21/2018 Document Reviewed: 01/21/2018 Elsevier Patient Education  2020 [employer].

## 2018-10-23 NOTE — Progress Notes (Signed)
Subjective:    Patient ID: Lynn Peters, female    DOB: Sep 23, 1955, 63 y.o.   MRN: [id number]  CC: Lynn Peters is a 63 y.o. female who presents today for physical exam.    HPI: Here today for CPE and also Lower more left-sided abdominal pain the past 2 weeks and feels like it is worsening.  She feels it comes and goes.  not sure of the trigger.  Felt better today after  Banana , piece of toast.  Had 1 cup of coffee.  She describes it as "cramping".  She is not doubled over in pain.    Denies any diarrhea, fever, cough, wheezing, constipation, low back pain, flank pain, epigastric burning.  Does not use NSAIDs or alcohol.    Has history diverticulitis and also ovarian cyst.  Occasionally has burning with urination.  No longer sexually active due to dryness.  Does not want to try any vaginal estrogen give history of CVA family   Colorectal Cancer Screening: due Breast Cancer Screening: Mammogram due Cervical Cancer Screening:due Bone Health screening/DEXA for 65+: No increased fracture risk. Defer screening at this time. Lung Cancer Screening: Doesn't have 30 year pack year history and age > 35 years.       Tetanus - due         Labs: Screening labs today. Exercise: Gets regular exercise.  Alcohol use: none Smoking/tobacco use: Nonsmoker.   Wears seat belt: Yes.  HISTORY:  Past Medical History:  Diagnosis Date  . Allergy   . Arthritis   . Asthma   . Chicken pox   . Diverticulitis 2009    Past Surgical History:  Procedure Laterality Date  . ABDOMINAL HYSTERECTOMY  2004   partial; has cervix and ovaries per patient; done for fibroids, noncancerous  . ANTERIOR CRUCIATE LIGAMENT REPAIR Right 2000   Family History  Problem Relation Age of Onset  . Alzheimer's disease Mother   . Asthma Mother   . Diabetes Mother   . Hyperlipidemia Mother   . Hypertension Mother   . Stroke Mother   . Heart disease Father        CHF  . Diabetes Father   . Hyperlipidemia Father   .  Alcohol abuse Brother   . Heart disease Brother   . Asthma Sister   . Cancer Sister        uterine  . Diabetes Sister   . Breast cancer Neg Hx       ALLERGIES: Pantoprazole sodium, Flagyl [metronidazole], Tetracyclines & related, and Breo ellipta [fluticasone furoate-vilanterol]  Current Outpatient Medications on File Prior to Visit  Medication Sig Dispense Refill  . albuterol (VENTOLIN HFA) 108 (90 Base) MCG/ACT inhaler INHALE 2 PUFFS INTO THE LUNGS EVERY 4 (FOUR) HOURS AS NEEDED FOR WHEEZING OR SHORTNESS OF BREATH. 18 g 2  . AMBULATORY NON FORMULARY MEDICATION Medication Name: Spacer to be used with Flovent inhaler. 1 each 0  . cetirizine HCl (ZYRTEC CHILDRENS ALLERGY) 5 MG/5ML SOLN Take 2.5 mg by mouth daily.    . famotidine (PEPCID) 20 MG tablet Take 1 tablet (20 mg total) by mouth daily. 30 tablet 2  . fluticasone (FLOVENT HFA) 220 MCG/ACT inhaler Inhale 2 puffs into the lungs 2 (two) times daily. 3 Inhaler 3  . ipratropium-albuterol (DUONEB) 0.5-2.5 (3) MG/3ML SOLN Take 3 mLs by nebulization every 8 (eight) hours as needed. 360 mL 11  . phenazopyridine (PYRIDIUM) 200 MG tablet Take 1 tablet (200 mg total) by mouth 3 (three) times  daily as needed for pain. 10 tablet 0  . Probiotic Product (SOLUBLE FIBER/PROBIOTICS PO) Take by mouth.     Current Facility-Administered Medications on File Prior to Visit  Medication Dose Route Frequency Provider Last Rate Last Dose  . ipratropium-albuterol (DUONEB) 0.5-2.5 (3) MG/3ML nebulizer solution 3 mL  3 mL Nebulization Q6H , Yvetta Coder, FNP        Social History   Tobacco Use  . Smoking status: Never Smoker  . Smokeless tobacco: Never Used  Substance Use Topics  . Alcohol use: Yes    Alcohol/week: 0.0 standard drinks  . Drug use: No    Review of Systems  Constitutional: Negative for chills, fever and unexpected weight change.  HENT: Negative for congestion.   Respiratory: Negative for cough, shortness of breath and wheezing.    Cardiovascular: Negative for chest pain, palpitations and leg swelling.  Gastrointestinal: Positive for abdominal pain. Negative for constipation, diarrhea, nausea and vomiting.  Genitourinary: Negative for decreased urine volume, difficulty urinating, hematuria, vaginal discharge and vaginal pain.  Musculoskeletal: Negative for arthralgias and myalgias.  Skin: Negative for rash.  Neurological: Negative for headaches.  Hematological: Negative for adenopathy.  Psychiatric/Behavioral: Negative for confusion and suicidal ideas.      Objective:    BP 102/64   Pulse 76   Temp 97.9 F (36.6 C)   Ht 5' 5.95" (1.675 m)   Wt 161 lb (73 kg)   SpO2 98%   BMI 26.03 kg/m   BP Readings from Last 3 Encounters:  10/23/18 102/64  10/16/18 102/70  12/18/17 110/78   Wt Readings from Last 3 Encounters:  10/23/18 161 lb (73 kg)  10/16/18 164 lb 6.4 oz (74.6 kg)  12/18/17 159 lb 3.2 oz (72.2 kg)    Physical Exam Vitals signs reviewed.  Constitutional:      Appearance: Normal appearance. She is well-developed.  Eyes:     Conjunctiva/sclera: Conjunctivae normal.  Neck:     Thyroid: No thyroid mass or thyromegaly.  Cardiovascular:     Rate and Rhythm: Normal rate and regular rhythm.     Pulses: Normal pulses.     Heart sounds: Normal heart sounds.  Pulmonary:     Effort: Pulmonary effort is normal.     Breath sounds: Normal breath sounds. No wheezing, rhonchi or rales.  Chest:     Breasts: Breasts are symmetrical.        Right: No inverted nipple, mass, nipple discharge, skin change or tenderness.        Left: No inverted nipple, mass, nipple discharge, skin change or tenderness.  Abdominal:     General: Bowel sounds are normal. There is no distension.     Palpations: Abdomen is soft. Abdomen is not rigid. There is no fluid wave or mass.     Tenderness: There is abdominal tenderness in the left lower quadrant. There is no guarding or rebound.  Genitourinary:    Cervix: No cervical  motion tenderness, discharge or friability.     Uterus: Not enlarged, not fixed and not tender.      Adnexa:        Right: No mass, tenderness or fullness.         Left: No mass, tenderness or fullness.       Comments: Pap performed. No CMT. Unable to appreciated ovaries. Lymphadenopathy:     Head:     Right side of head: No submental, submandibular, tonsillar, preauricular, posterior auricular or occipital adenopathy.     Left  side of head: No submental, submandibular, tonsillar, preauricular, posterior auricular or occipital adenopathy.     Cervical:     Right cervical: No superficial, deep or posterior cervical adenopathy.    Left cervical: No superficial, deep or posterior cervical adenopathy.     Upper Body:     Right upper body: No pectoral adenopathy.     Left upper body: No pectoral adenopathy.  Skin:    General: Skin is warm and dry.  Neurological:     Mental Status: She is alert.  Psychiatric:        Speech: Speech normal.        Behavior: Behavior normal.        Thought Content: Thought content normal.        Assessment & Plan:   Problem List Items Addressed This Visit      Other   Routine physical examination - Primary    Clinical breast exam and Pap smear performed.  Patient will schedule mammogram.      Relevant Orders   MM 3D SCREEN BREAST BILATERAL   TSH   Cytology - PAP   CBC with Differential/Platelet   Comprehensive metabolic panel   Hemoglobin A1c   Lipid panel   VITAMIN D 25 Hydroxy (Vit-D Deficiency, Fractures)   Ambulatory referral to Gastroenterology   Left sided abdominal pain    Afebrile and nontoxic in appearance.  On exam  appreciate focal left-sided pain.  No pain with bimanual exam ; at this time, suspect GI etiology. Location does not raise suspicion for PUD.  Pending stat CT abdomen pelvis particularly with patient's history of diverticulitis.       Other Visit Diagnoses    Left lower quadrant abdominal pain       Relevant Orders    CT ABDOMEN PELVIS W CONTRAST       I have discontinued Sariyah Vicencio's nitrofurantoin (macrocrystal-monohydrate). I am also having her maintain her Probiotic Product (SOLUBLE FIBER/PROBIOTICS PO), ipratropium-albuterol, AMBULATORY NON FORMULARY MEDICATION, famotidine, phenazopyridine, cetirizine HCl, fluticasone, and albuterol. We will continue to administer ipratropium-albuterol.   No orders of the defined types were placed in this encounter.   Return precautions given.   Risks, benefits, and alternatives of the medications and treatment plan prescribed today were discussed, and patient expressed understanding.   Education regarding symptom management and diagnosis given to patient on AVS.   Continue to follow with Burnard Hawthorne, FNP for routine health maintenance.   Lavena Bullion and I agreed with plan.   Mable Paris, FNP

## 2018-10-23 NOTE — Telephone Encounter (Signed)
Radiologist, Dr Melanee Spry, calling to give verbal report for abdominal CT done today due to LLQ pain. Negative for serious process.  I will send message to ordering provider.  Nayleen Janosik Martin[place], MD

## 2018-10-23 NOTE — Assessment & Plan Note (Addendum)
Afebrile and nontoxic in appearance.  On exam  appreciate focal left-sided pain.  No pain with bimanual exam ; at this time, suspect GI etiology. Location does not raise suspicion for PUD.  Pending stat CT abdomen pelvis particularly with patient's history of diverticulitis.

## 2018-10-25 ENCOUNTER — Encounter: Payer: Self-pay | Admitting: Family

## 2018-10-26 ENCOUNTER — Telehealth: Payer: Self-pay | Admitting: Family

## 2018-10-26 ENCOUNTER — Telehealth: Payer: Self-pay

## 2018-10-26 DIAGNOSIS — R899 Unspecified abnormal finding in specimens from other organs, systems and tissues: Secondary | ICD-10-CM

## 2018-10-26 DIAGNOSIS — R300 Dysuria: Secondary | ICD-10-CM

## 2018-10-26 NOTE — Telephone Encounter (Signed)
I Called pt and discussed results of CT when I got preliminary results.   No adnexal mass Left ureter fullness I have placed referral to urology for further evaluation as we discussed   Lynn Peters PT:   1) Please advise patient to call office and let us know if she doesn't hear from [place] regarding urology referral /appt in another week.  2) Per her message, she may have had a small stone, but nothing was noted on CT regarding stone. Has her pain resolved?  I would complete the macrobid since she has started. I would also emphasize that I think urology could be helpful here. Please ensure pain improved and no other symptoms- hematuria, flank pain, fever, nausea, vomiting.   3) Also, CT Ab shows atherosclerosis.  Although she is  low cardiovascular risk based on her cholesterol and overall risk factors, when you see cholesterol buildup in aorta, I do advise consideration of a cholesterol medication such as Crestor.  Particularly in the setting of her family history of heart disease.  Is patient willing to consider cholesterol medication?   4) labs from CPE:  Normal kidney function, thyroid, vitamin d, blood sugar, and electrolytes Low cardiovascular risk.  Hemoglobin ever so slightly elevated; may be transient. Advise patient to repeat in a couple of weeks and sch lab appointment.

## 2018-10-26 NOTE — Telephone Encounter (Signed)
Copied from [place] [id number]. Topic: General - Other >> Oct 26, 2018  9:45 AM Nils Flack wrote: Reason for CRM: [phone] Please call pt back about labs.  Pt also wants to know how Joycelyn Schmid is doing.

## 2018-10-26 NOTE — Telephone Encounter (Signed)
LMTCB

## 2018-10-26 NOTE — Telephone Encounter (Signed)
Noted! Thank you

## 2018-10-27 ENCOUNTER — Other Ambulatory Visit: Payer: BC Managed Care – PPO

## 2018-10-27 LAB — CYTOLOGY - PAP
Diagnosis: NEGATIVE
HPV: NOT DETECTED

## 2018-10-29 ENCOUNTER — Encounter: Payer: Self-pay | Admitting: Family

## 2018-11-09 ENCOUNTER — Ambulatory Visit: Payer: BC Managed Care – PPO | Admitting: Family Medicine

## 2018-11-09 ENCOUNTER — Encounter: Payer: Self-pay | Admitting: Family

## 2018-11-09 ENCOUNTER — Other Ambulatory Visit: Payer: Self-pay

## 2018-11-09 ENCOUNTER — Encounter: Payer: Self-pay | Admitting: Family Medicine

## 2018-11-09 ENCOUNTER — Ambulatory Visit (INDEPENDENT_AMBULATORY_CARE_PROVIDER_SITE_OTHER): Payer: BC Managed Care – PPO | Admitting: Family Medicine

## 2018-11-09 VITALS — Ht 66.0 in | Wt 160.0 lb

## 2018-11-09 DIAGNOSIS — R102 Pelvic and perineal pain: Secondary | ICD-10-CM | POA: Diagnosis not present

## 2018-11-09 DIAGNOSIS — N289 Disorder of kidney and ureter, unspecified: Secondary | ICD-10-CM

## 2018-11-09 DIAGNOSIS — R3989 Other symptoms and signs involving the genitourinary system: Secondary | ICD-10-CM

## 2018-11-09 NOTE — Progress Notes (Signed)
Virtual Visit via video Note  This visit type was conducted due to national recommendations for restrictions regarding the COVID-19 pandemic (e.g. social distancing).  This format is felt to be most appropriate for this patient at this time.  All issues noted in this document were discussed and addressed.  No physical exam was performed (except for noted visual exam findings with Video Visits).   I connected with Lynn Peters today at  3:15 PM EDT by video and verified that I am speaking with the correct person using two identifiers. Location patient: home Location provider: work Persons participating in the virtual visit: patient, provider  I discussed the limitations, risks, security and privacy concerns of performing an evaluation and management service by telephone and the availability of in person appointments. I also discussed with the patient that there may be a patient responsible charge related to this service. The patient expressed understanding and agreed to proceed.  Reason for visit: follow-up.  HPI: Suprapubic pain/ureter fullness: This has been going on for about a month.  It has worsened recently.  She was seen previously by her PCP with dysuria and lower abdominal cramping.  She also had some lower back pain as well with this.  She underwent evaluation with labs, CT imaging, and urine studies.  Imaging revealed a fullness of the left ureter though no other contributing causes.  She was treated with antibiotics and that did help though she redeveloped symptoms about 4 days after completing the antibiotics.  She is status post hysterectomy.  She does have a history of interstitial cystitis about a decade ago.  She notes dysuria, urgency, and frequency.  No hematuria.  Some nausea.  No vomiting, diarrhea, or constipation.  No vaginal discharge.  No fevers.  She notes it does not feel like it is ovarian pain.  Pyridium has not been beneficial.  She feels as though it is consistent with  the prior interstitial cystitis that she had.  She has an appointment with urology in the middle of October.   ROS: See pertinent positives and negatives per HPI.  Past Medical History:  Diagnosis Date  . Allergy   . Arthritis   . Asthma   . Chicken pox   . Diverticulitis 2009    Past Surgical History:  Procedure Laterality Date  . ABDOMINAL HYSTERECTOMY  2004   partial; has cervix and ovaries per patient; done for fibroids, noncancerous  . ANTERIOR CRUCIATE LIGAMENT REPAIR Right 2000    Family History  Problem Relation Age of Onset  . Alzheimer's disease Mother   . Asthma Mother   . Diabetes Mother   . Hyperlipidemia Mother   . Hypertension Mother   . Stroke Mother   . Heart disease Father        CHF  . Diabetes Father   . Hyperlipidemia Father   . Alcohol abuse Brother   . Heart disease Brother   . Asthma Sister   . Cancer Sister        uterine  . Diabetes Sister   . Breast cancer Neg Hx     SOCIAL HX: Non-smoker.   Current Outpatient Medications:  .  albuterol (VENTOLIN HFA) 108 (90 Base) MCG/ACT inhaler, INHALE 2 PUFFS INTO THE LUNGS EVERY 4 (FOUR) HOURS AS NEEDED FOR WHEEZING OR SHORTNESS OF BREATH., Disp: 18 g, Rfl: 2 .  AMBULATORY NON FORMULARY MEDICATION, Medication Name: Spacer to be used with Flovent inhaler., Disp: 1 each, Rfl: 0 .  amitriptyline (ELAVIL) 10 MG  tablet, Take 1 tablet (10 mg total) by mouth at bedtime., Disp: 30 tablet, Rfl: 1 .  cetirizine HCl (ZYRTEC CHILDRENS ALLERGY) 5 MG/5ML SOLN, Take 2.5 mg by mouth daily., Disp: , Rfl:  .  famotidine (PEPCID) 20 MG tablet, Take 1 tablet (20 mg total) by mouth daily., Disp: 30 tablet, Rfl: 2 .  fluticasone (FLOVENT HFA) 220 MCG/ACT inhaler, Inhale 2 puffs into the lungs 2 (two) times daily., Disp: 3 Inhaler, Rfl: 3 .  ipratropium-albuterol (DUONEB) 0.5-2.5 (3) MG/3ML SOLN, Take 3 mLs by nebulization every 8 (eight) hours as needed., Disp: 360 mL, Rfl: 11 .  phenazopyridine (PYRIDIUM) 200 MG tablet,  Take 1 tablet (200 mg total) by mouth 3 (three) times daily as needed for pain., Disp: 10 tablet, Rfl: 0 .  Probiotic Product (SOLUBLE FIBER/PROBIOTICS PO), Take by mouth., Disp: , Rfl:   Current Facility-Administered Medications:  .  ipratropium-albuterol (DUONEB) 0.5-2.5 (3) MG/3ML nebulizer solution 3 mL, 3 mL, Nebulization, Q6H, Arnett, Yvetta Coder, FNP  EXAM:  VITALS per patient if applicable: None.  GENERAL: alert, oriented, appears well and in no acute distress  HEENT: atraumatic, conjunttiva clear, no obvious abnormalities on inspection of external nose and ears  NECK: normal movements of the head and neck  LUNGS: on inspection no signs of respiratory distress, breathing rate appears normal, no obvious gross SOB, gasping or wheezing  CV: no obvious cyanosis  MS: moves all visible extremities without noticeable abnormality  PSYCH/NEURO: pleasant and cooperative, no obvious depression or anxiety, speech and thought processing grossly intact  ASSESSMENT AND PLAN:  Discussed the following assessment and plan:  Suprapubic pain Concern for possible interstitial cystitis.  It would seem to be less likely related to UTI given prior negative urine culture.  I discussed with her that some type of bladder cancer would also seem less likely as that typically presents as a painless issue.  Discussed that she needs to see urology.  We will recheck her urine to rule out infection again.  We will trial amitriptyline for pain control for possible interstitial cystitis based on review of up-to-date.  Discussed possibility of drowsiness, constipation, and dry mouth with this medication.  Advised that she would have to taper off of this if we stopped it in the future.  We will try to get her in to see urology sooner if possible.  Disorder of ureter Discussed that there was no obvious cause on her imaging. Discussed need for seeing urology.     I discussed the assessment and treatment plan with  the patient. The patient was provided an opportunity to ask questions and all were answered. The patient agreed with the plan and demonstrated an understanding of the instructions.   The patient was advised to call back or seek an in-person evaluation if the symptoms worsen or if the condition fails to improve as anticipated.  Tommi Rumps, MD

## 2018-11-10 ENCOUNTER — Other Ambulatory Visit (INDEPENDENT_AMBULATORY_CARE_PROVIDER_SITE_OTHER): Payer: BC Managed Care – PPO

## 2018-11-10 ENCOUNTER — Other Ambulatory Visit: Payer: Self-pay

## 2018-11-10 ENCOUNTER — Ambulatory Visit: Payer: BC Managed Care – PPO | Admitting: Family Medicine

## 2018-11-10 DIAGNOSIS — N289 Disorder of kidney and ureter, unspecified: Secondary | ICD-10-CM | POA: Insufficient documentation

## 2018-11-10 DIAGNOSIS — R3989 Other symptoms and signs involving the genitourinary system: Secondary | ICD-10-CM

## 2018-11-10 DIAGNOSIS — R102 Pelvic and perineal pain: Secondary | ICD-10-CM | POA: Insufficient documentation

## 2018-11-10 LAB — POCT URINALYSIS DIPSTICK
Bilirubin, UA: NEGATIVE
Blood, UA: NEGATIVE
Glucose, UA: NEGATIVE
Ketones, UA: NEGATIVE
Leukocytes, UA: NEGATIVE
Nitrite, UA: NEGATIVE
Protein, UA: NEGATIVE
Spec Grav, UA: <=1.005 — AB (ref 1.010–1.025)
Urobilinogen, UA: 0.2 E.U./dL
pH, UA: 5 (ref 5.0–8.0)

## 2018-11-10 MED ORDER — AMITRIPTYLINE HCL 10 MG PO TABS: 10.0000 mg | ORAL_TABLET | Freq: Every day | ORAL | 1 refills | Status: DC

## 2018-11-10 NOTE — Assessment & Plan Note (Signed)
Discussed that there was no obvious cause on her imaging. Discussed need for seeing urology.

## 2018-11-10 NOTE — Assessment & Plan Note (Signed)
Concern for possible interstitial cystitis.  It would seem to be less likely related to UTI given prior negative urine culture.  I discussed with her that some type of bladder cancer would also seem less likely as that typically presents as a painless issue.  Discussed that she needs to see urology.  We will recheck her urine to rule out infection again.  We will trial amitriptyline for pain control for possible interstitial cystitis based on review of up-to-date.  Discussed possibility of drowsiness, constipation, and dry mouth with this medication.  Advised that she would have to taper off of this if we stopped it in the future.  We will try to get her in to see urology sooner if possible.

## 2018-11-12 ENCOUNTER — Encounter: Payer: Self-pay | Admitting: Family

## 2018-11-25 ENCOUNTER — Other Ambulatory Visit: Payer: Self-pay

## 2018-11-25 ENCOUNTER — Encounter: Payer: Self-pay | Admitting: Urology

## 2018-11-25 ENCOUNTER — Ambulatory Visit (INDEPENDENT_AMBULATORY_CARE_PROVIDER_SITE_OTHER): Payer: BC Managed Care – PPO | Admitting: Urology

## 2018-11-25 VITALS — BP 128/74 | HR 70 | Ht 65.0 in | Wt 161.0 lb

## 2018-11-25 DIAGNOSIS — R300 Dysuria: Secondary | ICD-10-CM

## 2018-11-25 DIAGNOSIS — N3010 Interstitial cystitis (chronic) without hematuria: Secondary | ICD-10-CM

## 2018-11-25 MED ORDER — AMITRIPTYLINE HCL 10 MG PO TABS: 5.0000 mg | ORAL_TABLET | Freq: Every day | ORAL | 2 refills | Status: DC

## 2018-11-25 MED ORDER — CIMETIDINE 400 MG PO TABS: 400.0000 mg | ORAL_TABLET | Freq: Every day | ORAL | 3 refills | Status: DC

## 2018-11-25 NOTE — Patient Instructions (Signed)
Interstitial Cystitis  Interstitial cystitis is inflammation of the bladder. This may cause pain in the bladder area as well as a frequent and urgent need to urinate. The bladder is a hollow organ in the lower part of the abdomen. It stores urine after the urine is made in the kidneys. The severity of interstitial cystitis can vary from person to person. You may have flare-ups, and then your symptoms may go away for a while. For many people, it becomes a long-term (chronic) problem. What are the causes? The cause of this condition is not known. What increases the risk? The following factors may make you more likely to develop this condition:  You are female.  You have fibromyalgia.  You have irritable bowel syndrome (IBS).  You have endometriosis. This condition may be aggravated by:  Stress.  Smoking.  Spicy foods. What are the signs or symptoms? Symptoms of interstitial cystitis vary, and they can change over time. Symptoms may include:  Discomfort or pain in the bladder area, which is in the lower abdomen. Pain can range from mild to severe. The pain may change in intensity as the bladder fills with urine or as it empties.  Pain in the pelvic area, between the hip bones.  An urgent need to urinate.  Frequent urination.  Pain during urination.  Pain during sex.  Blood in the urine. For women, symptoms often get worse during menstruation. How is this diagnosed? This condition is diagnosed based on your symptoms, your medical history, and a physical exam. You may have tests to rule out other conditions, such as:  Urine tests.  Cystoscopy. For this test, a tool similar to a very thin telescope is used to look into your bladder.  Biopsy. This involves taking a sample of tissue from the bladder to be examined under a microscope. How is this treated? There is no cure for this condition, but treatment can help you control your symptoms. Work closely with your health care  provider to find the most effective treatments for you. Treatment options may include:  Medicines to relieve pain and reduce how often you feel the need to urinate.  Learning ways to control when you urinate (bladder training).  Lifestyle changes, such as changing your diet or taking steps to control stress.  Using a device that provides electrical stimulation to your nerves, which can relieve pain (neuromodulation therapy). The device is placed on your back, where it blocks the nerves that cause you to feel pain in your bladder area.  A procedure that stretches your bladder by filling it with air or fluid.  Surgery. This is rare. It is only done for extreme cases, if other treatments do not help. Follow these instructions at home: Bladder training   Use bladder training techniques as directed. Techniques may include: ? Urinating at scheduled times. ? Training yourself to delay urination. ? Doing exercises (Kegel exercises) to strengthen the muscles that control urine flow.  Keep a bladder diary. ? Write down the times that you urinate and any symptoms that you have. This can help you find out which foods, liquids, or activities make your symptoms worse. ? Use your bladder diary to schedule bathroom trips. If you are away from home, plan to be near a bathroom at each of your scheduled times.  Make sure that you urinate just before you leave the house and just before you go to bed. Eating and drinking  Make dietary changes as recommended by your health care provider. You  may need to avoid: ? Spicy foods. ? Foods that contain a lot of potassium.  Limit your intake of beverages that make you need to urinate. These include: ? Caffeinated beverages like soda, coffee, and tea. ? Alcohol. General instructions  Take over-the-counter and prescription medicines only as told by your health care provider.  Do not drink alcohol.  You can try a warm or cool compress over your bladder for  comfort.  Avoid wearing tight clothing.  Do not use any products that contain nicotine or tobacco, such as cigarettes and e-cigarettes. If you need help quitting, ask your health care provider.  Keep all follow-up visits as told by your health care provider. This is important. Contact a health care provider if you have:  Symptoms that do not get better with treatment.  Pain or discomfort that gets worse.  More frequent urges to urinate.  A fever. Get help right away if:  You have no control over when you urinate. Summary  Interstitial cystitis is inflammation of the bladder.  This condition may cause pain in the bladder area as well as a frequent and urgent need to urinate.  You may have flare-ups of the condition, and then it may go away for a while. For many people, it becomes a long-term (chronic) problem.  There is no cure for interstitial cystitis, but treatment methods are available to control your symptoms. This information is not intended to replace advice given to you by your health care provider. Make sure you discuss any questions you have with your health care provider. Document Released: 09/29/2003 Document Revised: 01/10/2017 Document Reviewed: 12/23/2016 Elsevier Patient Education  2020 [employer] for Interstitial Cystitis Interstitial cystitis (IC) is a long-term (chronic) condition that causes pain and pressure in the bladder, the lower abdomen, and the pelvic area. Other symptoms of IC include urinary urgency and frequency. Symptoms tend to come and go. Many people with IC find that certain foods trigger their symptoms. Different foods may be problematic for different people. Some foods are more likely to cause symptoms than others. Learning which foods bother you and which do not can help you come up with an eating plan to manage IC. What are tips for following this plan? You may find it helpful to work with a dietitian. This health care  provider can help you develop your eating plan by doing an elimination diet, which involves these steps:  Start with a list of foods that you think trigger your IC symptoms along with the foods that most commonly trigger symptoms for many people with IC.  Eliminate those foods from your diet for about one month, then start reintroducing the foods one at a time to see which ones trigger your symptoms.  Make a list of the foods that trigger your symptoms. It may take several months to find out which foods bother you. Reading food labels Once you know which foods trigger your IC symptoms, you can avoid them. However, it is also a good idea to read food labels because some foods that trigger your symptoms may be included as ingredients in other foods. These ingredients may include:  Chili peppers.  Tomato products.  Soy.  Worcestershire sauce.  Vinegar.  Alcohol.  Citrus flavors or juices.  Artificial sweeteners.  Monosodium glutamate. Shopping  Shopping can be a challenge if many foods trigger your IC. When you go grocery shopping, bring a list of the foods you can eat.  You can get an  app for your phone that lets you know which foods are the safest and which you may want to avoid. You can find the app at the Interstitial Cystitis Network website: www.ic-network.com Meal planning  Plan your meals according to the results of your elimination diet. If you have not done an elimination diet, plan meals according to IC food lists recommended by your health care provider or dietitian. These lists tell you which foods are least and most likely to cause symptoms.  Avoid certain types of food when you go out to eat, such as pizza and foods typically served at [place], [place], and [place]. These foods often contain ingredients that can aggravate IC. General information Here are some general guidelines for an IC eating plan:  Do not eat large portions.  Drink plenty of fluids  with your meals.  Do not eat foods that are high in sugar, salt, or saturated fat.  Choose whole fruits instead of juice.  Eat a colorful variety of vegetables. What foods should I eat? For people with IC, the best diet is a balanced one that includes things from all the food groups. Even if you have to avoid certain foods, there are still plenty of healthy choices in each group. The following are some foods that are least bothersome and may be safest to eat: Fruits Bananas. Blueberries and blueberry juice. Melons. Pears. Apples. Dates. Prunes. Raisins. Apricots. Vegetables Asparagus. Avocado. Celery. Beets. Bell peppers. Black olives. Broccoli. Brussels sprouts. Cabbage. Carrots. Cauliflower. Cucumber. Eggplant. Green beans. Potatoes. Radishes. Spinach. Squash. Turnips. Zucchini. Mushrooms. Peas. Grains Oats. Rice. Bran. Oatmeal. Whole wheat bread. Meats and other proteins Beef. Fish and other seafood. Eggs. Nuts. Peanut butter. Pork. Poultry. Lamb. Garbanzo beans. Pinto beans. Dairy Whole or low-fat milk. American, mozzarella, mild cheddar, feta, ricotta, and cream cheeses. The items listed above may not be a complete list of foods and beverages you can eat. Contact a dietitian for more information. What foods should I avoid? You should avoid any foods that seem to trigger your symptoms. It is also a good idea to avoid foods that are most likely to cause symptoms in many people with IC. These include the following: Fruits Citrus fruits, including lemons, limes, oranges, and grapefruit. Cranberries. Strawberries. Pineapple. Kiwi. Vegetables Chili peppers. Onions. Sauerkraut. Tomato and tomato products. Angie Fava. Grains You do not need to avoid any type of grain unless it triggers your symptoms. Meats and other proteins Precooked or cured meats, such as sausages or meat loaves. Soy products. Dairy Chocolate ice cream. Processed cheese. Yogurt. Beverages Alcohol. Chocolate drinks.  Coffee. Cranberry juice. Carbonated drinks. Tea (black, green, or herbal). Tomato juice. Sports drinks. The items listed above may not be a complete list of foods and beverages you should avoid. Contact a dietitian for more information. Summary  Many people with IC find that certain foods trigger their symptoms. Different foods may be problematic for different people. Some foods are more likely to cause symptoms than others.  You may find it helpful to work with a dietitian to do an elimination diet and come up with an eating plan that is right for you.  Plan your meals according to the results of your elimination diet. If you have not done an elimination diet, plan your meals using IC food lists. These lists tell you which foods are least and most likely to cause symptoms.  The best diet for people with IC is a balanced diet that includes foods from all the food groups. Even  if you have to avoid certain foods, there are still plenty of healthy choices in each group. This information is not intended to replace advice given to you by your health care provider. Make sure you discuss any questions you have with your health care provider. Document Released: 10/02/2017 Document Revised: 05/21/2018 Document Reviewed: 10/02/2017 Elsevier Patient Education  2020 [employer].

## 2018-11-25 NOTE — Progress Notes (Signed)
11/25/18 2:02 PM   Lynn Peters May 20, 1955 [id number]  Referring provider: Burnard Hawthorne, [place],  [place] [address]  CC: Bladder pain  HPI: I saw [name] in urology clinic in consultation from Mable Paris, NP for bladder pain and history of interstitial cystitis.  She is a 63 year old female with past medical history notable for GERD and asthma who has a multiyear chronic history of lower abdominal, pelvic, and low back pain.  She reportedly was diagnosed with interstitial cystitis many years ago, but these records are unavailable to me.  She denies any history of urinary tract infections, and urinalyses have all been completely benign with no WBCs or microscopic hematuria.  Her past surgical history is notable for hysterectomy.  She really does not report significant urinary symptoms aside from some occasional frequency and dysuria, but her symptoms are more pain and pressure related.  She cannot associate this with any activity, and there are no aggravating or alleviating factors.  She reportedly cannot take NSAIDs at all because they upset her stomach.  She denies any smoking history.  Severity is moderate.  Her primary had recommended amitriptyline nightly, but the patient does not want to take this medication secondary to possible side effects.  She is not sure she is ever had a cystoscopy in the past.  Imaging is notable for a CT scan with contrast in September 2020 that showed subtle dilation of the left ureter all the way down to a distended bladder.  There were similar findings on a CT in 2017 as well, and I suspect this is more likely related to reflux then obstruction.  She does not have any significant left-sided renal colic, and her pain is more bilateral and low midline.  PVR in clinic today is normal at 110 mL.   PMH: Past Medical History:  Diagnosis Date  . Allergy   . Arthritis   . Asthma   . Chicken pox   . Diverticulitis 2009    Surgical  History: Past Surgical History:  Procedure Laterality Date  . ABDOMINAL HYSTERECTOMY  2004   partial; has cervix and ovaries per patient; done for fibroids, noncancerous  . ANTERIOR CRUCIATE LIGAMENT REPAIR Right 2000    Allergies:  Allergies  Allergen Reactions  . Pantoprazole Sodium Shortness Of Breath  . Flagyl [Metronidazole]     Neck pain, spasm  . Tetracyclines & Related   . Breo Ellipta [Fluticasone Furoate-Vilanterol] Palpitations    Family History: Family History  Problem Relation Age of Onset  . Alzheimer's disease Mother   . Asthma Mother   . Diabetes Mother   . Hyperlipidemia Mother   . Hypertension Mother   . Stroke Mother   . Heart disease Father        CHF  . Diabetes Father   . Hyperlipidemia Father   . Alcohol abuse Brother   . Heart disease Brother   . Asthma Sister   . Cancer Sister        uterine  . Diabetes Sister   . Breast cancer Neg Hx     Social History:  reports that she has never smoked. She has never used smokeless tobacco. She reports current alcohol use. She reports that she does not use drugs.  ROS: Please see flowsheet from today's date for complete review of systems.  Physical Exam: BP 128/74   Pulse 70   Ht 5\' 5"  (1.651 m)   Wt 161 lb (73 kg)   BMI  26.79 kg/m    Constitutional:  Alert and oriented, No acute distress. Cardiovascular: No clubbing, cyanosis, or edema. Respiratory: Normal respiratory effort, no increased work of breathing. GI: Abdomen is soft, nontender, nondistended, no abdominal masses GU: No CVA tenderness Lymph: No cervical or inguinal lymphadenopathy. Skin: No rashes, bruises or suspicious lesions. Neurologic: Grossly intact, no focal deficits, moving all 4 extremities. Psychiatric: Normal mood and affect.  Laboratory Data:  Urinalysis today 0-5 WBCs, 0-2 RBCs, 0-10 epithelial cells, few bacteria, no yeast, nitrite negative  Pertinent Imaging: Reviewed, see HPI  Assessment & Plan:   In summary,  the patient is a 62 year old female with vague and complex symptoms of pelvic pain, pressure, and bilateral low back pain.  I had a very long conversation with the patient about interstitial cystitis and bladder pain syndrome.  I also reviewed her CT imaging with her, and we discussed that the the subtle left dilation of the ureter secondary to a dilated bladder, and appears to be present since at least 2017.  We discussed the very low, but not 0, risk of malignancy and obstruction, however with no microscopic hematuria, no renal colic, normal renal function, and stability since 2017, all these things would suggest these findings are more consistent with reflux than obstruction.  I reviewed the AUA guidelines regarding the treatment algorithm for interstitial cystitis and bladder pain, and we focused on pelvic floor physical therapy, behavioral modifications, pain management, and diet modifications for interstitial cystitis.  She is very hesitant to take any medications.  -Referral to pelvic floor physical therapy -Prescriptions provided for low-dose amitriptyline and cimetidine, patient would like to research both and decide which when she would like to take -Extensive patient education/information regarding interstitial cystitis and AUA guidelines provided to patient today -RTC 8 weeks for virtual visit -Consider cystoscopy/possible left retrograde pyelogram/diagnostic ureteroscopy  if refractory symptoms -Consider referral to Dr. Alona Bene in [place] if not making progress in the future  A total of 60 minutes were spent face-to-face with the patient, greater than 50% was spent in patient education, counseling, and coordination of care regarding interstitial cystitis and bladder pain syndrome.   Billey Co, [facility] [address] [place], [place] [address] [phone]

## 2018-11-26 LAB — URINALYSIS, COMPLETE
Bilirubin, UA: NEGATIVE
Glucose, UA: NEGATIVE
Ketones, UA: NEGATIVE
Leukocytes,UA: NEGATIVE
Nitrite, UA: NEGATIVE
Protein,UA: NEGATIVE
Specific Gravity, UA: <1.005 — ABNORMAL LOW (ref 1.005–1.030)
Urobilinogen, Ur: 0.2 mg/dL (ref 0.2–1.0)
pH, UA: 6 (ref 5.0–7.5)

## 2018-11-26 LAB — MICROSCOPIC EXAMINATION

## 2018-12-10 ENCOUNTER — Ambulatory Visit: Payer: BLUE CROSS/BLUE SHIELD | Admitting: Urology

## 2018-12-28 ENCOUNTER — Encounter: Payer: Self-pay | Admitting: Internal Medicine

## 2019-01-25 ENCOUNTER — Ambulatory Visit: Payer: Self-pay | Admitting: Urology

## 2019-01-26 ENCOUNTER — Telehealth: Payer: BC Managed Care – PPO | Admitting: Urology

## 2019-02-15 ENCOUNTER — Ambulatory Visit: Payer: BC Managed Care – PPO | Admitting: Family

## 2019-02-22 ENCOUNTER — Encounter: Payer: Self-pay | Admitting: Family

## 2019-02-24 ENCOUNTER — Other Ambulatory Visit: Payer: Self-pay | Admitting: Family

## 2019-02-24 DIAGNOSIS — J4530 Mild persistent asthma, uncomplicated: Secondary | ICD-10-CM

## 2019-03-11 ENCOUNTER — Other Ambulatory Visit: Payer: Self-pay

## 2019-03-11 ENCOUNTER — Ambulatory Visit: Payer: 59 | Admitting: Pulmonary Disease

## 2019-03-11 ENCOUNTER — Encounter: Payer: Self-pay | Admitting: Pulmonary Disease

## 2019-03-11 VITALS — BP 132/76 | HR 81 | Temp 97.0°F | Ht 65.0 in | Wt 160.0 lb

## 2019-03-11 DIAGNOSIS — J4530 Mild persistent asthma, uncomplicated: Secondary | ICD-10-CM

## 2019-03-11 MED ORDER — QVAR REDIHALER 80 MCG/ACT IN AERB: 2.0000 | INHALATION_SPRAY | Freq: Two times a day (BID) | RESPIRATORY_TRACT | 6 refills | Status: DC

## 2019-03-11 MED ORDER — SPIRIVA RESPIMAT 1.25 MCG/ACT IN AERS: 2.0000 | INHALATION_SPRAY | Freq: Every day | RESPIRATORY_TRACT | 0 refills | Status: DC

## 2019-03-11 MED ORDER — SPIRIVA RESPIMAT 1.25 MCG/ACT IN AERS: 2.0000 | INHALATION_SPRAY | Freq: Every day | RESPIRATORY_TRACT | 6 refills | Status: DC

## 2019-03-11 NOTE — Progress Notes (Signed)
    Assessment & Plan:  1. Mild persistent asthma without complication (Primary)   Patient Instructions  We have switch her Flovent  to Qvar , 2 inhalations twice a day  New inhaler Spiriva  1.25 mcg, 2 puffs once a day   Follow-up will be in 4 to 6 weeks time call sooner should any new difficulties arise.  Please note: late entry documentation due to logistical difficulties during COVID-19 pandemic. This note is filed for information purposes only, and is not intended to be used for billing, nor does it represent the full scope/nature of the visit in question. Please see any associated scanned media linked to date of encounter for additional pertinent information.  Subjective:    HPI: Lynn Peters is a 64 y.o. female presenting to the pulmonology clinic on 03/11/2019 with report of: Follow-up (previous ds pt- pt states her breathing has not been great since last ov. Has increased on using inhalers. Allergic to dogs-has an outside dog that recently was brought into the house and symptoms have increased. Occ. cough is non productive. Pt denies any SOB, fever, or chills. States she does not let herself wheeze.)     Outpatient Encounter Medications as of 03/11/2019  Medication Sig   famotidine  (PEPCID ) 20 MG tablet Take 1 tablet (20 mg total) by mouth daily.   Probiotic Product (SOLUBLE FIBER/PROBIOTICS PO) Take 1 capsule by mouth daily.   [DISCONTINUED] albuterol  (VENTOLIN  HFA) 108 (90 Base) MCG/ACT inhaler INHALE 2 PUFFS INTO THE LUNGS EVERY 4 (FOUR) HOURS AS NEEDED FOR WHEEZING OR SHORTNESS OF BREATH.   [DISCONTINUED] AMBULATORY NON FORMULARY MEDICATION Medication Name: Spacer to be used with Flovent  inhaler.   [DISCONTINUED] cetirizine HCl (ZYRTEC) 5 MG/5ML SOLN Take 2.5 mg by mouth daily. (Patient not taking: Reported on 05/17/2020)   [DISCONTINUED] fluticasone  (FLOVENT  HFA) 220 MCG/ACT inhaler Inhale 2 puffs into the lungs 2 (two) times daily.   [DISCONTINUED] ipratropium-albuterol   (DUONEB) 0.5-2.5 (3) MG/3ML SOLN Take 3 mLs by nebulization every 8 (eight) hours as needed.   [DISCONTINUED] phenazopyridine  (PYRIDIUM ) 200 MG tablet Take 1 tablet (200 mg total) by mouth 3 (three) times daily as needed for pain. (Patient not taking: Reported on 05/17/2020)   [DISCONTINUED] amitriptyline  (ELAVIL ) 10 MG tablet Take 0.5 tablets (5 mg total) by mouth at bedtime.   [DISCONTINUED] beclomethasone (QVAR  REDIHALER) 80 MCG/ACT inhaler Inhale 2 puffs into the lungs 2 (two) times daily.   [DISCONTINUED] cimetidine  (TAGAMET ) 400 MG tablet Take 1 tablet (400 mg total) by mouth daily.   [DISCONTINUED] Tiotropium Bromide Monohydrate  (SPIRIVA  RESPIMAT) 1.25 MCG/ACT AERS Inhale 2 puffs into the lungs daily. (Patient not taking: Reported on 05/03/2019)   [DISCONTINUED] Tiotropium Bromide Monohydrate  (SPIRIVA  RESPIMAT) 1.25 MCG/ACT AERS Inhale 2 puffs into the lungs daily. (Patient not taking: Reported on 04/15/2019)   [DISCONTINUED] ipratropium-albuterol  (DUONEB) 0.5-2.5 (3) MG/3ML nebulizer solution 3 mL    No facility-administered encounter medications on file as of 03/11/2019.      Objective:   Vitals:   03/11/19 1151  BP: 132/76  Pulse: 81  Temp: (!) 97 F (36.1 C)  Height: 5' 5 (1.651 m)  Weight: 160 lb (72.6 kg)  SpO2: 100% Comment: on ra  TempSrc: Temporal  BMI (Calculated): 26.63     Physical exam documentation is limited by delayed entry of information.

## 2019-03-11 NOTE — Progress Notes (Signed)
   Subjective:    Patient ID: Lynn Peters, female    DOB: 02/23/1955, 64 y.o.   MRN: [id number]  HPI    Review of Systems     Objective:   Physical Exam        Assessment & Plan:

## 2019-03-11 NOTE — Patient Instructions (Signed)
We have switch her Flovent to Qvar, 2 inhalations twice a day  New inhaler Spiriva 1.25 mcg, 2 puffs once a day   Follow-up will be in 4 to 6 weeks time call sooner should any new difficulties arise.

## 2019-03-15 NOTE — Telephone Encounter (Signed)
LG, please advise. thanks

## 2019-03-15 NOTE — Telephone Encounter (Signed)
Can try a spacer to see.

## 2019-03-16 NOTE — Telephone Encounter (Signed)
Please see mychart message and advise.

## 2019-03-17 NOTE — Telephone Encounter (Signed)
Unfortunately, [place] cannot render an opinion [place].

## 2019-03-30 ENCOUNTER — Encounter: Payer: Self-pay | Admitting: Family

## 2019-04-04 ENCOUNTER — Encounter: Payer: Self-pay | Admitting: Family

## 2019-04-05 ENCOUNTER — Encounter: Payer: Self-pay | Admitting: Family

## 2019-04-05 ENCOUNTER — Telehealth: Payer: Self-pay

## 2019-04-05 ENCOUNTER — Other Ambulatory Visit: Payer: Self-pay | Admitting: Family

## 2019-04-05 DIAGNOSIS — R1031 Right lower quadrant pain: Secondary | ICD-10-CM

## 2019-04-05 NOTE — Telephone Encounter (Signed)
See my chart message

## 2019-04-05 NOTE — Telephone Encounter (Signed)
I tried to call patient to triage. I was unable to reach & asked for call back. I did advise on VM  Since we have no available appointments. I advised she should really be evaluated.

## 2019-04-05 NOTE — Telephone Encounter (Signed)
I responded to YOU after seeing her mychart. Please advise uc

## 2019-04-06 ENCOUNTER — Encounter: Payer: Self-pay | Admitting: Family

## 2019-04-06 ENCOUNTER — Telehealth: Payer: Self-pay | Admitting: Family

## 2019-04-06 DIAGNOSIS — R1031 Right lower quadrant pain: Secondary | ICD-10-CM

## 2019-04-06 DIAGNOSIS — Z8719 Personal history of other diseases of the digestive system: Secondary | ICD-10-CM

## 2019-04-06 NOTE — Telephone Encounter (Signed)
close

## 2019-04-07 ENCOUNTER — Encounter: Payer: Self-pay | Admitting: Family

## 2019-04-07 ENCOUNTER — Ambulatory Visit: Payer: 59 | Admitting: Family

## 2019-04-07 ENCOUNTER — Other Ambulatory Visit: Payer: Self-pay | Admitting: Family

## 2019-04-07 ENCOUNTER — Ambulatory Visit
Admission: RE | Admit: 2019-04-07 | Discharge: 2019-04-07 | Disposition: A | Discharge status: Home / Self Care | Payer: 59 | Source: Ambulatory Visit | Attending: Family | Admitting: Family

## 2019-04-07 ENCOUNTER — Other Ambulatory Visit: Payer: Self-pay

## 2019-04-07 VITALS — BP 128/78 | HR 81 | Temp 97.5°F | Resp 16 | Wt 158.0 lb

## 2019-04-07 DIAGNOSIS — R1033 Periumbilical pain: Secondary | ICD-10-CM | POA: Diagnosis not present

## 2019-04-07 DIAGNOSIS — Z8719 Personal history of other diseases of the digestive system: Secondary | ICD-10-CM | POA: Diagnosis present

## 2019-04-07 DIAGNOSIS — R935 Abnormal findings on diagnostic imaging of other abdominal regions, including retroperitoneum: Secondary | ICD-10-CM

## 2019-04-07 DIAGNOSIS — R1031 Right lower quadrant pain: Secondary | ICD-10-CM | POA: Insufficient documentation

## 2019-04-07 LAB — CBC WITH DIFFERENTIAL/PLATELET
Basophils Absolute: 0.1 10*3/uL (ref 0.0–0.1)
Basophils Relative: 0.7 % (ref 0.0–3.0)
Eosinophils Absolute: 0.1 10*3/uL (ref 0.0–0.7)
Eosinophils Relative: 1.2 % (ref 0.0–5.0)
HCT: 47.3 % — ABNORMAL HIGH (ref 36.0–46.0)
Hemoglobin: 15.7 g/dL — ABNORMAL HIGH (ref 12.0–15.0)
Lymphocytes Relative: 31.6 % (ref 12.0–46.0)
Lymphs Abs: 2.4 10*3/uL (ref 0.7–4.0)
MCHC: 33.2 g/dL (ref 30.0–36.0)
MCV: 91.5 fl (ref 78.0–100.0)
Monocytes Absolute: 0.6 10*3/uL (ref 0.1–1.0)
Monocytes Relative: 8.1 % (ref 3.0–12.0)
Neutro Abs: 4.4 10*3/uL (ref 1.4–7.7)
Neutrophils Relative %: 58.4 % (ref 43.0–77.0)
Platelets: 354 10*3/uL (ref 150.0–400.0)
RBC: 5.17 Mil/uL — ABNORMAL HIGH (ref 3.87–5.11)
RDW: 12.9 % (ref 11.5–15.5)
WBC: 7.6 10*3/uL (ref 4.0–10.5)

## 2019-04-07 LAB — URINALYSIS
Bilirubin Urine: NEGATIVE
Hgb urine dipstick: NEGATIVE
Ketones, ur: NEGATIVE
Leukocytes,Ua: NEGATIVE
Nitrite: NEGATIVE
Specific Gravity, Urine: <=1.005 — AB (ref 1.000–1.030)
Total Protein, Urine: NEGATIVE
Urine Glucose: NEGATIVE
Urobilinogen, UA: 0.2 (ref 0.0–1.0)
pH: 7 (ref 5.0–8.0)

## 2019-04-07 LAB — COMPREHENSIVE METABOLIC PANEL
ALT: 24 U/L (ref 0–35)
AST: 26 U/L (ref 0–37)
Albumin: 4.3 g/dL (ref 3.5–5.2)
Alkaline Phosphatase: 79 U/L (ref 39–117)
BUN: 5 mg/dL — ABNORMAL LOW (ref 6–23)
CO2: 28 mEq/L (ref 19–32)
Calcium: 10 mg/dL (ref 8.4–10.5)
Chloride: 101 mEq/L (ref 96–112)
Creatinine, Ser: 0.82 mg/dL (ref 0.40–1.20)
GFR: 70.17 mL/min (ref >60.00)
Glucose, Bld: 88 mg/dL (ref 70–99)
Potassium: 3.8 mEq/L (ref 3.5–5.1)
Sodium: 139 mEq/L (ref 135–145)
Total Bilirubin: 0.6 mg/dL (ref 0.2–1.2)
Total Protein: 7.5 g/dL (ref 6.0–8.3)

## 2019-04-07 LAB — POCT I-STAT CREATININE: Creatinine, Ser: 0.7 mg/dL (ref 0.44–1.00)

## 2019-04-07 LAB — LIPASE: Lipase: 30 U/L (ref 11.0–59.0)

## 2019-04-07 MED ORDER — IOHEXOL 300 MG/ML  SOLN
100.0000 mL | Freq: Once | INTRAMUSCULAR | Status: AC | PRN
  Administered 2019-04-07: 100 mL via INTRAVENOUS

## 2019-04-07 NOTE — Patient Instructions (Signed)
Please continue to stay very vigilant in regards to your symptoms.  We will cast a broad net today including labs, CT abdomen and urine.  We will be in touch!

## 2019-04-07 NOTE — Assessment & Plan Note (Signed)
Patient is well-appearing.  She is nontoxic in appearance.  On exam able to elicit tenderness although not exquisite.  Pending labs, CT abdomen.  Advised patient to stay extremely vigilant as etiology of pain is nonspecific at this time.  Diverticulosis certainly in the differential based on her history.

## 2019-04-07 NOTE — Progress Notes (Signed)
Subjective:    Patient ID: Lynn Peters, female    DOB: [birth year], 64 y.o.   MRN: [id number]  CC: Lynn Peters is a 64 y.o. female who presents today for an acute visit.    HPI: Acute visit Chief complaint of umbilical abdominal "stitch" for the past 5 days, waxing and waning.  She feels fine today.  She does endorse bloating ,fatigue and loose stools.  No diarrhea fever nausea or vomiting.  She been drinking broth and increase her vegetables for the last 4 days and thinks this may be helping.  She does also endorse decreased appetite.  No dysuria.  Has a history of diverticulitis in her 23s.  She is no history of ovarian cyst.  She does have a history of a hysterectomy with bilateral ovaries still intact.   HISTORY:  Past Medical History:  Diagnosis Date  . Allergy   . Arthritis   . Asthma   . Chicken pox   . Diverticulitis 2009   Past Surgical History:  Procedure Laterality Date  . ABDOMINAL HYSTERECTOMY  2004   partial; has cervix and ovaries per patient; done for fibroids, noncancerous  . ANTERIOR CRUCIATE LIGAMENT REPAIR Right 2000   Family History  Problem Relation Age of Onset  . Alzheimer's disease Mother   . Asthma Mother   . Diabetes Mother   . Hyperlipidemia Mother   . Hypertension Mother   . Stroke Mother   . Heart disease Father        CHF  . Diabetes Father   . Hyperlipidemia Father   . Alcohol abuse Brother   . Heart disease Brother   . Asthma Sister   . Cancer Sister        uterine  . Diabetes Sister   . Breast cancer Neg Hx     Allergies: Pantoprazole sodium, Flagyl [metronidazole], Tetracyclines & related, and Breo ellipta [fluticasone furoate-vilanterol] Current Outpatient Medications on File Prior to Visit  Medication Sig Dispense Refill  . albuterol (VENTOLIN HFA) 108 (90 Base) MCG/ACT inhaler INHALE 2 PUFFS INTO THE LUNGS EVERY 4 (FOUR) HOURS AS NEEDED FOR WHEEZING OR SHORTNESS OF BREATH. 18 g 2  . AMBULATORY NON FORMULARY MEDICATION Medication  Name: Spacer to be used with Flovent inhaler. 1 each 0  . beclomethasone (QVAR REDIHALER) 80 MCG/ACT inhaler Inhale 2 puffs into the lungs 2 (two) times daily. 10.6 g 6  . cetirizine HCl (ZYRTEC CHILDRENS ALLERGY) 5 MG/5ML SOLN Take 2.5 mg by mouth daily.    . famotidine (PEPCID) 20 MG tablet Take 1 tablet (20 mg total) by mouth daily. 30 tablet 2  . ipratropium-albuterol (DUONEB) 0.5-2.5 (3) MG/3ML SOLN Take 3 mLs by nebulization every 8 (eight) hours as needed. 360 mL 11  . phenazopyridine (PYRIDIUM) 200 MG tablet Take 1 tablet (200 mg total) by mouth 3 (three) times daily as needed for pain. 10 tablet 0  . Probiotic Product (SOLUBLE FIBER/PROBIOTICS PO) Take by mouth.    . Tiotropium Bromide Monohydrate (SPIRIVA RESPIMAT) 1.25 MCG/ACT AERS Inhale 2 puffs into the lungs daily. 4 g 6  . Tiotropium Bromide Monohydrate (SPIRIVA RESPIMAT) 1.25 MCG/ACT AERS Inhale 2 puffs into the lungs daily. 4 g 0   Current Facility-Administered Medications on File Prior to Visit  Medication Dose Route Frequency Provider Last Rate Last Admin  . ipratropium-albuterol (DUONEB) 0.5-2.5 (3) MG/3ML nebulizer solution 3 mL  3 mL Nebulization Q6H Burnard Hawthorne, FNP        Social History   Tobacco  Use  . Smoking status: Never Smoker  . Smokeless tobacco: Never Used  Substance Use Topics  . Alcohol use: Yes    Alcohol/week: 0.0 standard drinks  . Drug use: No    Review of Systems  Constitutional: Negative for chills and fever.  Respiratory: Negative for cough.   Cardiovascular: Negative for chest pain and palpitations.  Gastrointestinal: Positive for abdominal distention and abdominal pain. Negative for diarrhea, nausea and vomiting.      Objective:    BP 128/78   Pulse 81   Temp (!) 97.5 F (36.4 C)   Resp 16   Wt 158 lb (71.7 kg)   SpO2 99%   BMI 26.29 kg/m    Physical Exam Vitals reviewed.  Constitutional:      Appearance: She is well-developed.  Eyes:     Conjunctiva/sclera:  Conjunctivae normal.  Cardiovascular:     Rate and Rhythm: Normal rate and regular rhythm.     Pulses: Normal pulses.     Heart sounds: Normal heart sounds.  Pulmonary:     Effort: Pulmonary effort is normal.     Breath sounds: Normal breath sounds. No wheezing, rhonchi or rales.  Abdominal:     General: Bowel sounds are normal. There is no distension.     Palpations: Abdomen is rigid. There is no mass.     Tenderness: There is abdominal tenderness in the periumbilical area. There is no right CVA tenderness, left CVA tenderness or guarding.  Skin:    General: Skin is warm and dry.  Neurological:     Mental Status: She is alert.  Psychiatric:        Speech: Speech normal.        Behavior: Behavior normal.        Thought Content: Thought content normal.        Assessment & Plan:   Problem List Items Addressed This Visit      Other   Abdominal pain - Primary    Patient is well-appearing.  She is nontoxic in appearance.  On exam able to elicit tenderness although not exquisite.  Pending labs, CT abdomen.  Advised patient to stay extremely vigilant as etiology of pain is nonspecific at this time.  Diverticulosis certainly in the differential based on her history.      Relevant Orders   CBC with Differential/Platelet   Comprehensive metabolic panel   Lipase   Urine Culture   Urinalysis   CA 125         I am having Lavena Bullion maintain her Probiotic Product (SOLUBLE FIBER/PROBIOTICS PO), ipratropium-albuterol, AMBULATORY NON FORMULARY MEDICATION, famotidine, phenazopyridine, cetirizine HCl, albuterol, Qvar RediHaler, Spiriva Respimat, and Spiriva Respimat. We will continue to administer ipratropium-albuterol.   No orders of the defined types were placed in this encounter.   Return precautions given.   Risks, benefits, and alternatives of the medications and treatment plan prescribed today were discussed, and patient expressed understanding.   Education regarding  symptom management and diagnosis given to patient on AVS.  Continue to follow with Burnard Hawthorne, FNP for routine health maintenance.   Lavena Bullion and I agreed with plan.   Mable Paris, FNP

## 2019-04-08 ENCOUNTER — Ambulatory Visit: Payer: 59 | Admitting: Pulmonary Disease

## 2019-04-08 ENCOUNTER — Encounter: Payer: Self-pay | Admitting: Family

## 2019-04-08 LAB — URINE CULTURE
MICRO NUMBER:: 10183703
Result:: NO GROWTH
SPECIMEN QUALITY:: ADEQUATE

## 2019-04-09 ENCOUNTER — Other Ambulatory Visit: Payer: Self-pay | Admitting: Family

## 2019-04-09 ENCOUNTER — Ambulatory Visit: Payer: 59 | Admitting: Family

## 2019-04-09 ENCOUNTER — Ambulatory Visit: Payer: 59 | Admitting: Nurse Practitioner

## 2019-04-09 DIAGNOSIS — R109 Unspecified abdominal pain: Secondary | ICD-10-CM

## 2019-04-11 ENCOUNTER — Encounter: Payer: Self-pay | Admitting: Family

## 2019-04-12 ENCOUNTER — Other Ambulatory Visit: Payer: Self-pay

## 2019-04-12 ENCOUNTER — Other Ambulatory Visit (INDEPENDENT_AMBULATORY_CARE_PROVIDER_SITE_OTHER): Payer: 59

## 2019-04-12 ENCOUNTER — Ambulatory Visit: Payer: 59 | Admitting: Nurse Practitioner

## 2019-04-12 ENCOUNTER — Other Ambulatory Visit: Payer: Self-pay | Admitting: Family

## 2019-04-12 DIAGNOSIS — R718 Other abnormality of red blood cells: Secondary | ICD-10-CM

## 2019-04-12 DIAGNOSIS — R109 Unspecified abdominal pain: Secondary | ICD-10-CM

## 2019-04-13 ENCOUNTER — Other Ambulatory Visit: Payer: Self-pay

## 2019-04-13 ENCOUNTER — Encounter: Payer: Self-pay | Admitting: Family

## 2019-04-13 ENCOUNTER — Encounter: Payer: Self-pay | Admitting: Gastroenterology

## 2019-04-13 ENCOUNTER — Ambulatory Visit: Payer: 59 | Admitting: Gastroenterology

## 2019-04-13 VITALS — BP 133/84 | HR 91 | Temp 97.7°F | Ht 65.0 in | Wt 156.6 lb

## 2019-04-13 DIAGNOSIS — R933 Abnormal findings on diagnostic imaging of other parts of digestive tract: Secondary | ICD-10-CM | POA: Diagnosis not present

## 2019-04-13 DIAGNOSIS — R109 Unspecified abdominal pain: Secondary | ICD-10-CM

## 2019-04-13 DIAGNOSIS — R197 Diarrhea, unspecified: Secondary | ICD-10-CM | POA: Diagnosis not present

## 2019-04-13 DIAGNOSIS — R1031 Right lower quadrant pain: Secondary | ICD-10-CM

## 2019-04-13 DIAGNOSIS — G8929 Other chronic pain: Secondary | ICD-10-CM

## 2019-04-13 MED ORDER — NA SULFATE-K SULFATE-MG SULF 17.5-3.13-1.6 GM/177ML PO SOLN: 354.0000 mL | Freq: Once | ORAL | 0 refills | Status: AC

## 2019-04-13 NOTE — Progress Notes (Signed)
Cephas Darby, MD [address]  [place], [place] [address]  Main: [phone]  Fax: [phone]    Gastroenterology Consultation  Referring Provider:     Burnard Hawthorne, FNP Primary Care Physician:  Burnard Hawthorne, FNP Primary Gastroenterologist:  Dr. Cephas Darby Reason for Consultation: Abdominal pain, diarrhea        HPI:   Lynn Peters is a 64 y.o. female referred by Dr. Burnard Hawthorne, FNP  for consultation & management of abdominal pain, diarrhea.  Patient reports that she has been experiencing mild lower abdominal discomfort for last 1 year associated with loose stools about 2-3 times per day which are nonbloody.  However, for the last 2 weeks she has been experiencing lower abdominal discomfort, progressively worse associated with several episodes of nonbloody loose bowel movements, upper abdominal bloating.  She reports the pain is worse in her right lower quadrant.  She was seen by PCP on 2/24, underwent work-up which revealed elevated hemoglobin, normal WBC count, lipase, LFTs were normal, UA negative.  She underwent CT abdomen and pelvis with contrast which did not reveal any acute intra-abdominal pathology, no evidence of acute appendicitis.  Possible wall thickening of the distal stomach, mild sigmoid diverticulosis without evidence of acute inflammation.  Due to ongoing symptoms patient has been referred to GI for further evaluation.  Patient reports that she was treated with antibiotics for interstitial cystitis about a month ago.  Currently, she is eating bland diet.  Reports feeling nauseous today.  She does have history of heartburn, underwent upper GI with small bowel follow-through, revealed moderate amount of reflux.  She has been on Pepcid since then  She also had history of palpitations, chest pain, underwent cardiac evaluation in 2019 which was unremarkable.  She has history of PVCs with no recurrence were attributed to caffeine  intake.  She does not smoke or drink alcohol.  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 She is a vegan and mostly believes in Ayurvedic medicine.  NSAIDs: None  Antiplts/Anticoagulants/Anti thrombotics: None  GI Procedures: Reports having had 2-3 colonoscopies when she was younger about 10 to 15 years ago when she had an episode of acute diverticulitis. She did not undergo any GI surgeries in the past She has a sister with Crohn's disease and another sister with ulcerative colitis  Past Medical History:  Diagnosis Date  . Allergy   . Arthritis   . Asthma   . Chicken pox   . Diverticulitis 2009    Past Surgical History:  Procedure Laterality Date  . ABDOMINAL HYSTERECTOMY  2004   partial; has cervix and ovaries per patient; done for fibroids, noncancerous  . ANTERIOR CRUCIATE LIGAMENT REPAIR Right 2000    Current Outpatient Medications:  .  albuterol (VENTOLIN HFA) 108 (90 Base) MCG/ACT inhaler, INHALE 2 PUFFS INTO THE LUNGS EVERY 4 (FOUR) HOURS AS NEEDED FOR WHEEZING OR SHORTNESS OF BREATH., Disp: 18 g, Rfl: 2 .  AMBULATORY NON FORMULARY MEDICATION, Medication Name: Spacer to be used with Flovent inhaler., Disp: 1 each, Rfl: 0 .  beclomethasone (QVAR REDIHALER) 80 MCG/ACT inhaler, Inhale 2 puffs into the lungs 2 (two) times daily., Disp: 10.6 g, Rfl: 6 .  cetirizine HCl (ZYRTEC CHILDRENS ALLERGY) 5 MG/5ML SOLN, Take 2.5 mg by mouth daily., Disp: , Rfl:  .  famotidine (PEPCID) 20 MG tablet, Take 1 tablet (20 mg total) by mouth daily., Disp: 30 tablet, Rfl: 2 .  ipratropium-albuterol (DUONEB) 0.5-2.5 (3) MG/3ML SOLN, Take 3 mLs by nebulization every 8 (eight) hours as needed., Disp: 360 mL, Rfl: 11 .  phenazopyridine (PYRIDIUM) 200 MG tablet, Take 1 tablet (200 mg total) by mouth 3 (three) times daily as needed for pain., Disp: 10 tablet, Rfl: 0 .  Probiotic Product (SOLUBLE FIBER/PROBIOTICS PO), Take by mouth., Disp: , Rfl:  .  Tiotropium Bromide  Monohydrate (SPIRIVA RESPIMAT) 1.25 MCG/ACT AERS, Inhale 2 puffs into the lungs daily., Disp: 4 g, Rfl: 6 .  Tiotropium Bromide Monohydrate (SPIRIVA RESPIMAT) 1.25 MCG/ACT AERS, Inhale 2 puffs into the lungs daily., Disp: 4 g, Rfl: 0 .  Na Sulfate-K Sulfate-Mg Sulf 17.5-3.13-1.6 GM/177ML SOLN, Take 354 mLs by mouth once for 1 dose., Disp: 354 mL, Rfl: 0  Current Facility-Administered Medications:  .  ipratropium-albuterol (DUONEB) 0.5-2.5 (3) MG/3ML nebulizer solution 3 mL, 3 mL, Nebulization, Q6H, Arnett, Yvetta Coder, FNP   Family History  Problem Relation Age of Onset  . Alzheimer's disease Mother   . Asthma Mother   . Diabetes Mother   . Hyperlipidemia Mother   . Hypertension Mother   . Stroke Mother   . Heart disease Father        CHF  . Diabetes Father   . Hyperlipidemia Father   . Alcohol abuse Brother   . Heart disease Brother   . Asthma Sister   . Cancer Sister        uterine  . Diabetes Sister   . Breast cancer Neg Hx      Social History   Tobacco Use  . Smoking status: Never Smoker  . Smokeless tobacco: Never Used  Substance Use Topics  . Alcohol use: Yes    Alcohol/week: 0.0 standard drinks  . Drug use: No    Allergies as of 04/13/2019 - Review Complete 04/13/2019  Allergen Reaction Noted  . Pantoprazole sodium Shortness Of Breath 08/29/2017  . Flagyl [metronidazole]  01/22/2016  . Tetracyclines & related  08/05/2016  . Breo ellipta [fluticasone furoate-vilanterol] Palpitations 03/20/2016    Review of Systems:    All systems reviewed and negative except where noted in HPI.   Physical Exam:  BP 133/84   Pulse 91   Temp 97.7 F (36.5 C) (Oral)   Ht 5\' 5"  (1.651 m)   Wt 156 lb 9.6 oz (71 kg)   BMI 26.06 kg/m  No LMP recorded. Patient has had a hysterectomy.  General:   Alert,  Well-developed, well-nourished, pleasant and cooperative in NAD Head:  Normocephalic and atraumatic. Eyes:  Sclera clear, no icterus.   Conjunctiva pink. Ears:  Normal  auditory acuity. Nose:  No deformity, discharge, or lesions. Mouth:  No deformity or lesions,oropharynx pink & moist. Neck:  Supple; no masses or thyromegaly. Lungs:  Respirations even and unlabored.  Clear throughout to auscultation.   No wheezes, crackles, or rhonchi. No acute distress. Heart:  Regular rate and rhythm; no murmurs, clicks, rubs, or gallops. Abdomen:  Normal bowel sounds. Soft, mild lower abdominal tenderness, worse in right lower quadrant, and non-distended without masses, hepatosplenomegaly or hernias noted.  No guarding or rebound tenderness.   Rectal: Not performed Msk:  Symmetrical without gross deformities. Good, equal movement & strength bilaterally. Pulses:  Normal pulses noted. Extremities:  No clubbing or edema.  No cyanosis. Neurologic:  Alert and oriented x3;  grossly normal neurologically. Skin:  Intact without significant lesions or rashes. No jaundice. Psych:  Alert and cooperative. Normal mood and affect.  Imaging Studies: Reviewed  Assessment and Plan:   Lynn Peters is a 64 y.o. pleasant female with no significant past medical history is seen in consultation for chronic lower abdominal pain and increased bowel frequency with exacerbation in last 2 weeks, worsening of nonbloody diarrhea and abdominal bloating.  CT abdomen and pelvis did not reveal any acute intra-abdominal pathology  Abdominal pain with nonbloody diarrhea and bloating Recommend stool studies to rule out infection Perform H. pylori breath test Recommend EGD and colonoscopy for further evaluation, particularly given family history of IBD  Follow up in 4 to 6 weeks   Cephas Darby, MD

## 2019-04-14 LAB — CA 125: CA 125: 29 U/mL (ref <35)

## 2019-04-15 ENCOUNTER — Encounter: Payer: Self-pay | Admitting: Oncology

## 2019-04-15 LAB — H. PYLORI BREATH TEST: H pylori Breath Test: NEGATIVE

## 2019-04-15 NOTE — Progress Notes (Signed)
Patient contacted regarding referral from [facility]. Pt aware of referral and appt. Chart reviewed and updated. No concerns or questions regarding visit.

## 2019-04-16 ENCOUNTER — Ambulatory Visit: Payer: 59 | Admitting: Family

## 2019-04-18 ENCOUNTER — Encounter: Payer: Self-pay | Admitting: Family

## 2019-04-19 ENCOUNTER — Encounter: Payer: Self-pay | Admitting: Gastroenterology

## 2019-04-19 ENCOUNTER — Inpatient Hospital Stay: Payer: 59

## 2019-04-19 ENCOUNTER — Inpatient Hospital Stay: Payer: 59 | Attending: Oncology | Admitting: Oncology

## 2019-04-19 ENCOUNTER — Other Ambulatory Visit: Payer: Self-pay

## 2019-04-19 VITALS — BP 138/95 | HR 77 | Temp 96.7°F

## 2019-04-19 DIAGNOSIS — D751 Secondary polycythemia: Secondary | ICD-10-CM

## 2019-04-19 DIAGNOSIS — J45909 Unspecified asthma, uncomplicated: Secondary | ICD-10-CM | POA: Insufficient documentation

## 2019-04-19 LAB — CBC WITH DIFFERENTIAL/PLATELET
Abs Immature Granulocytes: 0.02 10*3/uL (ref 0.00–0.07)
Basophils Absolute: 0 10*3/uL (ref 0.0–0.1)
Basophils Relative: 1 %
Eosinophils Absolute: 0.1 10*3/uL (ref 0.0–0.5)
Eosinophils Relative: 1 %
HCT: 47.6 % — ABNORMAL HIGH (ref 36.0–46.0)
Hemoglobin: 15.1 g/dL — ABNORMAL HIGH (ref 12.0–15.0)
Immature Granulocytes: 0 %
Lymphocytes Relative: 29 %
Lymphs Abs: 2.4 10*3/uL (ref 0.7–4.0)
MCH: 29.5 pg (ref 26.0–34.0)
MCHC: 31.7 g/dL (ref 30.0–36.0)
MCV: 93 fL (ref 80.0–100.0)
Monocytes Absolute: 0.7 10*3/uL (ref 0.1–1.0)
Monocytes Relative: 9 %
Neutro Abs: 4.8 10*3/uL (ref 1.7–7.7)
Neutrophils Relative %: 60 %
Platelets: 334 10*3/uL (ref 150–400)
RBC: 5.12 MIL/uL — ABNORMAL HIGH (ref 3.87–5.11)
RDW: 12.5 % (ref 11.5–15.5)
WBC: 8 10*3/uL (ref 4.0–10.5)
nRBC: 0 % (ref 0.0–0.2)

## 2019-04-19 LAB — GI PROFILE, STOOL, PCR

## 2019-04-19 LAB — TECHNOLOGIST SMEAR REVIEW: Plt Morphology: NORMAL

## 2019-04-19 NOTE — Progress Notes (Signed)
Hematology/Oncology Consult note [facility] Telephone:([phone] [phone]   Patient Care Team: Burnard Hawthorne, FNP as PCP - General (Family Medicine)  REFERRING PROVIDER: Burnard Hawthorne, FNP  CHIEF COMPLAINTS/REASON FOR VISIT:  Evaluation of polycytosis/erythrocytosis  HISTORY OF PRESENTING ILLNESS:  Lynn Peters is a 64 y.o. female who was seen in consultation at the request of Burnard Hawthorne, FNP for evaluation of polycytosis/erythrocytosis Reviewed patient's recent lab work which was obtain by PCP.  04/07/2019 labs showed elevated hemoglobin at 15.7,  total white count 7.6, platelet counts 354,000. Erythrocytosis, ischronic Onset, duration since at least 2017 at that time with a hemoglobin of 16. No aggravating or alleviating factors.   Associated signs or symptoms: Denies weight loss, fever, chills, fatigue, night sweats.   Context:  Smoking history: Never smoker.  She denies alcohol use.  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 History of blood clots: Denies Daytime somnolence: Denies Family history of polycythemia: Denies  Reports chronic abdominal pain.  Sometimes she has nonbloody diarrhea.  Patient was recently seen by gastroenterology Dr. Marius Ditch.  GI recommends stool studies to rule out infection, perform H. pylori breath test, recommend EGD and colonoscopy for further evaluation. Patient is a vegan Review of Systems  Constitutional: Negative for appetite change, chills, fatigue and fever.  HENT:   Negative for hearing loss and voice change.   Eyes: Negative for eye problems.  Respiratory: Negative for chest tightness and cough.   Cardiovascular: Negative for chest pain.  Gastrointestinal: Positive for abdominal pain. Negative for abdominal distention and blood in stool.  Endocrine: Negative for hot flashes.  Genitourinary: Negative for difficulty urinating and frequency.   Musculoskeletal: Negative  for arthralgias.  Skin: Negative for itching and rash.  Neurological: Negative for extremity weakness.  Hematological: Negative for adenopathy.  Psychiatric/Behavioral: Negative for confusion.    MEDICAL HISTORY:  Past Medical History:  Diagnosis Date  . Allergy   . Arthritis   . Asthma   . Chicken pox   . Diverticulitis 2009    SURGICAL HISTORY: Past Surgical History:  Procedure Laterality Date  . ABDOMINAL HYSTERECTOMY  2004   partial; has cervix and ovaries per patient; done for fibroids, noncancerous  . ANTERIOR CRUCIATE LIGAMENT REPAIR Right 2000    SOCIAL HISTORY: Social History   Socioeconomic History  . Marital status: Single    Spouse name: Not on file  . Number of children: Not on file  . Years of education: Not on file  . Highest education level: Not on file  Occupational History  . Not on file  Tobacco Use  . Smoking status: Never Smoker  . Smokeless tobacco: Never Used  Substance and Sexual Activity  . Alcohol use: Yes    Alcohol/week: 0.0 standard drinks  . Drug use: No  . Sexual activity: Yes    Partners: Male    Birth control/protection: Surgical  Other Topics Concern  . 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      Diet- gym, knee limits running, paddleboating. Interval training 3x per week.       Diet-regular      Social Determinants of Health   Financial Resource Strain:   . Difficulty of Paying Living Expenses: Not on file  Food Insecurity:   . Worried About Charity fundraiser  in the Last Year: Not on file  . Ran Out of Food in the Last Year: Not on file  Transportation Needs:   . Lack of Transportation (Medical): Not on file  . Lack of Transportation (Non-Medical): Not on file  Physical Activity:   . Days of Exercise per Week: Not on file  . Minutes of Exercise per Session: Not on file  Stress:   . Feeling of Stress : Not on file    Social Connections:   . Frequency of Communication with Friends and Family: Not on file  . Frequency of Social Gatherings with Friends and Family: Not on file  . Attends Religious Services: Not on file  . Active Member of Clubs or Organizations: Not on file  . Attends Archivist Meetings: Not on file  . Marital Status: Not on file  Intimate Partner Violence:   . Fear of Current or Ex-Partner: Not on file  . Emotionally Abused: Not on file  . Physically Abused: Not on file  . Sexually Abused: Not on file    FAMILY HISTORY: Family History  Problem Relation Age of Onset  . Alzheimer's disease Mother   . Asthma Mother   . Diabetes Mother   . Hyperlipidemia Mother   . Hypertension Mother   . Stroke Mother   . Heart disease Father        CHF  . Diabetes Father   . Hyperlipidemia Father   . Alcohol abuse Brother   . Heart disease Brother   . Asthma Sister   . Diabetes Sister   . Breast cancer Neg Hx     ALLERGIES:  is allergic to pantoprazole sodium; flagyl [metronidazole]; tetracyclines & related; and breo ellipta [fluticasone furoate-vilanterol].  MEDICATIONS:  Current Outpatient Medications  Medication Sig Dispense Refill  . albuterol (VENTOLIN HFA) 108 (90 Base) MCG/ACT inhaler INHALE 2 PUFFS INTO THE LUNGS EVERY 4 (FOUR) HOURS AS NEEDED FOR WHEEZING OR SHORTNESS OF BREATH. 18 g 2  . AMBULATORY NON FORMULARY MEDICATION Medication Name: Spacer to be used with Flovent inhaler. 1 each 0  . beclomethasone (QVAR REDIHALER) 80 MCG/ACT inhaler Inhale 2 puffs into the lungs 2 (two) times daily. 10.6 g 6  . cetirizine HCl (ZYRTEC CHILDRENS ALLERGY) 5 MG/5ML SOLN Take 2.5 mg by mouth daily.    . famotidine (PEPCID) 20 MG tablet Take 1 tablet (20 mg total) by mouth daily. 30 tablet 2  . ipratropium-albuterol (DUONEB) 0.5-2.5 (3) MG/3ML SOLN Take 3 mLs by nebulization every 8 (eight) hours as needed. 360 mL 11  . phenazopyridine (PYRIDIUM) 200 MG tablet Take 1 tablet (200 mg  total) by mouth 3 (three) times daily as needed for pain. 10 tablet 0  . Probiotic Product (SOLUBLE FIBER/PROBIOTICS PO) Take by mouth.    . Tiotropium Bromide Monohydrate (SPIRIVA RESPIMAT) 1.25 MCG/ACT AERS Inhale 2 puffs into the lungs daily. (Patient not taking: Reported on 04/15/2019) 4 g 6  . Tiotropium Bromide Monohydrate (SPIRIVA RESPIMAT) 1.25 MCG/ACT AERS Inhale 2 puffs into the lungs daily. (Patient not taking: Reported on 04/15/2019) 4 g 0   Current Facility-Administered Medications  Medication Dose Route Frequency Provider Last Rate Last Admin  . ipratropium-albuterol (DUONEB) 0.5-2.5 (3) MG/3ML nebulizer solution 3 mL  3 mL Nebulization Q6H Burnard Hawthorne, FNP         PHYSICAL EXAMINATION: ECOG PERFORMANCE STATUS: 1 - Symptomatic but completely ambulatory Vitals:   04/19/19 1047  BP: (!) 138/95  Pulse: 77  Temp: (!) 96.7 F (35.9  C)   Filed Weights    Physical Exam Constitutional:      General: She is not in acute distress. HENT:     Head: Normocephalic and atraumatic.  Eyes:     General: No scleral icterus. Cardiovascular:     Rate and Rhythm: Normal rate and regular rhythm.     Heart sounds: Normal heart sounds.  Pulmonary:     Effort: Pulmonary effort is normal. No respiratory distress.     Breath sounds: No wheezing.  Abdominal:     General: Bowel sounds are normal. There is no distension.     Palpations: Abdomen is soft.  Musculoskeletal:        General: No deformity. Normal range of motion.     Cervical back: Normal range of motion and neck supple.  Skin:    General: Skin is warm and dry.     Findings: No erythema or rash.  Neurological:     Mental Status: She is alert and oriented to person, place, and time. Mental status is at baseline.     Cranial Nerves: No cranial nerve deficit.     Coordination: Coordination normal.  Psychiatric:        Mood and Affect: Mood normal.     RADIOGRAPHIC STUDIES: I have personally reviewed the radiological  images as listed and agreed with the findings in the report. CT ABDOMEN PELVIS W CONTRAST  Result Date: 04/07/2019 CLINICAL DATA:  Right lower quadrant abdominal pain with fatigue for 1 week. History of diverticulosis and partial hysterectomy. EXAM: CT ABDOMEN AND PELVIS WITH CONTRAST TECHNIQUE: Multidetector CT imaging of the abdomen and pelvis was performed using the standard protocol following bolus administration of intravenous contrast. CONTRAST:  125mL OMNIPAQUE IOHEXOL 300 MG/ML  SOLN COMPARISON:  Abdominopelvic CT 10/23/2018. FINDINGS: Lower chest: Clear lung bases. No significant pleural or pericardial effusion. Hepatobiliary: The liver is normal in density without suspicious focal abnormality. No evidence of gallstones, gallbladder wall thickening or biliary dilatation. Pancreas: Unremarkable. No pancreatic ductal dilatation or surrounding inflammatory changes. Spleen: Normal in size without focal abnormality. Adrenals/Urinary Tract: Both adrenal glands appear normal. The kidneys appear normal without evidence of urinary tract calculus, suspicious lesion or hydronephrosis. No bladder abnormalities are seen. Stomach/Bowel: Possible wall thickening of the distal stomach, similar to previous study. No discrete ulceration or surrounding inflammation. The small bowel, appendix and proximal colon appear normal. There are mild diverticular changes within the sigmoid colon without surrounding inflammation. Vascular/Lymphatic: There are no enlarged abdominal or pelvic lymph nodes. No significant vascular findings. Reproductive: Stable prominence of the vaginal cuff post hysterectomy. No adnexal mass. Possible mild pelvic floor laxity. Other: No evidence of abdominal wall mass or hernia. No ascites. Musculoskeletal: No acute or significant osseous findings. Stable degenerative changes within the lumbar spine. IMPRESSION: 1. No acute findings or clear explanation for the patient's symptoms. The appendix appears  normal. 2. Possible wall thickening of the distal stomach, similar to previous study and nonspecific. Correlate with upper GI symptoms. 3. Stable mild sigmoid diverticulosis without acute inflammation. Electronically Signed   By: Richardean Sale M.D.   On: 04/07/2019 15:38     LABORATORY DATA:  I have reviewed the data as listed Lab Results  Component Value Date   WBC 8.0 04/19/2019   HGB 15.1 (H) 04/19/2019   HCT 47.6 (H) 04/19/2019   MCV 93.0 04/19/2019   PLT 334 04/19/2019   Recent Labs    10/23/18 1432 10/23/18 1432 10/23/18 1709 04/07/19 1243 04/07/19 1454  NA 140  --   --  139  --   K 3.8  --   --  3.8  --   CL 102  --   --  101  --   CO2 29  --   --  28  --   GLUCOSE 81  --   --  88  --   BUN 9  --   --  5*  --   CREATININE 0.77   < > 0.80 0.82 0.70  CALCIUM 9.6  --   --  10.0  --   PROT 6.9  --   --  7.5  --   ALBUMIN 4.0  --   --  4.3  --   AST 23  --   --  26  --   ALT 25  --   --  24  --   ALKPHOS 71  --   --  79  --   BILITOT 0.8  --   --  0.6  --    < > = values in this interval not displayed.   Iron/TIBC/Ferritin/ %Sat No results found for: IRON, TIBC, FERRITIN, IRONPCTSAT      ASSESSMENT & PLAN:  1. Erythrocytosis   Labs are reviewed and discussed with patient. Polycythemia (erythrocytosis) is an abnormal elevation of hemoglobin (Hgb) and/or hematocrit (Hct) in peripheral blood, and this can be caused by primary etiology, ie bone marrow mutation, or secondary etiology, ie hypoxia, smoking, etc. patient is a never smoker,.  She endorses chronic longstanding history of asthma.  She had a sleep study in 2019, patient has primary snoring, dysfunctioning associated with sleep stage or arousal from sleep.  Study did not demonstrate any significant obstructive or central sleep disordered breathing, with only mild intermittent snoring noted. I will obtain erythropoietin, carbo monoxide level, rule out primary etiology, JAK2 with reflex to other mutations,    Orders Placed This Encounter  Procedures  . CBC with Differential/Platelet    Standing Status:   Future    Number of Occurrences:   1    Standing Expiration Date:   04/18/2020  . Technologist smear review    Standing Status:   Future    Number of Occurrences:   1    Standing Expiration Date:   04/18/2020  . JAK2 V617F, w Reflex to CALR/E12/MPL    Standing Status:   Future    Number of Occurrences:   1    Standing Expiration Date:   04/18/2020  . Erythropoietin    Standing Status:   Future    Number of Occurrences:   1    Standing Expiration Date:   04/18/2020  . Carbon Monoxide, Blood    Standing Status:   Future    Number of Occurrences:   1    Standing Expiration Date:   04/18/2020    We spent sufficient time to discuss many aspect of care, questions were answered to patient's satisfaction. The patient knows to call the clinic with any problems questions or concerns.  Cc Burnard Hawthorne, FNP  Return of visit: 2 weeks Thank you for this kind referral and the opportunity to participate in the care of this patient. A copy of today's note is routed to referring provider    Earlie Server, MD, PhD 04/19/2019

## 2019-04-20 ENCOUNTER — Ambulatory Visit: Payer: 59 | Admitting: Gastroenterology

## 2019-04-20 LAB — ERYTHROPOIETIN: Erythropoietin: 7.9 m[IU]/mL (ref 2.6–18.5)

## 2019-04-20 LAB — CARBON MONOXIDE, BLOOD (PERFORMED AT REF LAB): Carbon Monoxide, Blood: 2.2 % (ref 0.0–3.6)

## 2019-04-23 ENCOUNTER — Encounter: Payer: Self-pay | Admitting: Family

## 2019-04-23 ENCOUNTER — Telehealth: Payer: Self-pay

## 2019-04-23 ENCOUNTER — Telehealth: Payer: Self-pay | Admitting: Family

## 2019-04-23 NOTE — Telephone Encounter (Signed)
See below Looks like Dr Marius Ditch is changing to screening Let pt know when you call

## 2019-04-23 NOTE — Telephone Encounter (Signed)
Patient is calling because she states her insurance company is charging her 3000 dollars because the diagnosis for her colonoscopy is abnormal CT scan. Patient states that her PCP told her it was not a abnormal ct scan because it was no acute findings. Patient states if we changed the diagnosis to a screening she would not have to pay anything. Explained to patient that we can not changed doctors diagnosis to make insurance pay for the procedure because that would be insurance fraud. Patient states that she does not feel like her CT scan is abnormal and would like a screening colonoscopy. Please advised

## 2019-04-23 NOTE — Telephone Encounter (Signed)
Pt called in and said Joycelyn Schmid told her that there were no acute findings on her CT Scan but the GI is saying her CT was abnormal and they are charging her $3000 for a colonoscopy. She is wondering if Joycelyn Schmid could speak with the GI dr and let them know they need to change the wording to not say abnormal so her insurance would pay for a routine colonoscopy? She is asking for a call back.

## 2019-04-23 NOTE — Telephone Encounter (Signed)
I will forqward you mychart message that patient sent back. She spoke with Dr. Verlin Grills office.

## 2019-04-23 NOTE — Telephone Encounter (Signed)
Call patient I sent her a mychart  Ms Cheramie,   I do not know how I can be of help here and really think Dr Marius Ditch more of help with how she orders your colonoscopy.  A diagnostic colonoscopy is performed when there are any concerns or patient symptoms that dictate that.   A Screening colonoscopy is done for screening only in the absence of concerns as I understand it. In regards to making that change with the procedure, you would need to speak with Dr Marius Ditch.   So sorry. Please dont get me started on insurance companies, they dictate how we practice medicine now and it is wrong beyond words.   Best Joycelyn Schmid

## 2019-04-23 NOTE — Telephone Encounter (Signed)
Patient aware see mychart.

## 2019-04-23 NOTE — Telephone Encounter (Signed)
Updated the referral and sent referral back to tracy. Called and talk to vicki and informed her to update the procedure. Informed patient of this information and she was grateful of this

## 2019-04-23 NOTE — Telephone Encounter (Signed)
She did have lower abdominal pain and diarrhea when I elicited her history Abnormal CT showed thickening of stomach only  Looked at the referral again, Reason for referral was  Due colonoscopy, distal portion of stomach thickened which was noted on CT abdomen and pelvis Due colonoscopy, distal portion of stomach thickened which was noted on CT abdomen and pelvis Abnormal CT and CC screening  Since, part the referral was for colon cancer screening, let's change her colonoscopy to screening  Thanks RV

## 2019-04-26 ENCOUNTER — Other Ambulatory Visit
Admission: RE | Admit: 2019-04-26 | Discharge: 2019-04-26 | Disposition: A | Discharge status: Home / Self Care | Payer: 59 | Source: Ambulatory Visit | Attending: Gastroenterology | Admitting: Gastroenterology

## 2019-04-26 DIAGNOSIS — Z01812 Encounter for preprocedural laboratory examination: Secondary | ICD-10-CM | POA: Diagnosis not present

## 2019-04-26 DIAGNOSIS — Z20822 Contact with and (suspected) exposure to covid-19: Secondary | ICD-10-CM | POA: Diagnosis not present

## 2019-04-26 LAB — SARS CORONAVIRUS 2 (TAT 6-24 HRS): SARS Coronavirus 2: NEGATIVE

## 2019-04-28 ENCOUNTER — Ambulatory Visit
Admission: RE | Admit: 2019-04-28 | Discharge: 2019-04-28 | Disposition: A | Discharge status: Home / Self Care | Payer: 59 | Attending: Gastroenterology | Admitting: Gastroenterology

## 2019-04-28 ENCOUNTER — Encounter: Payer: Self-pay | Admitting: Gastroenterology

## 2019-04-28 ENCOUNTER — Ambulatory Visit: Payer: 59 | Admitting: Anesthesiology

## 2019-04-28 ENCOUNTER — Other Ambulatory Visit: Payer: Self-pay

## 2019-04-28 ENCOUNTER — Encounter
Admission: RE | Disposition: A | Discharge status: Home / Self Care | Payer: Self-pay | Source: Home / Self Care | Attending: Gastroenterology

## 2019-04-28 DIAGNOSIS — R933 Abnormal findings on diagnostic imaging of other parts of digestive tract: Secondary | ICD-10-CM | POA: Diagnosis not present

## 2019-04-28 DIAGNOSIS — R140 Abdominal distension (gaseous): Secondary | ICD-10-CM | POA: Diagnosis present

## 2019-04-28 DIAGNOSIS — Z79899 Other long term (current) drug therapy: Secondary | ICD-10-CM | POA: Diagnosis not present

## 2019-04-28 DIAGNOSIS — R109 Unspecified abdominal pain: Secondary | ICD-10-CM

## 2019-04-28 DIAGNOSIS — D124 Benign neoplasm of descending colon: Secondary | ICD-10-CM | POA: Insufficient documentation

## 2019-04-28 DIAGNOSIS — K5730 Diverticulosis of large intestine without perforation or abscess without bleeding: Secondary | ICD-10-CM | POA: Diagnosis not present

## 2019-04-28 DIAGNOSIS — J45909 Unspecified asthma, uncomplicated: Secondary | ICD-10-CM | POA: Insufficient documentation

## 2019-04-28 DIAGNOSIS — R197 Diarrhea, unspecified: Secondary | ICD-10-CM

## 2019-04-28 DIAGNOSIS — Z1211 Encounter for screening for malignant neoplasm of colon: Secondary | ICD-10-CM

## 2019-04-28 DIAGNOSIS — K635 Polyp of colon: Secondary | ICD-10-CM | POA: Diagnosis not present

## 2019-04-28 HISTORY — PX: ESOPHAGOGASTRODUODENOSCOPY (EGD) WITH PROPOFOL: SHX5813

## 2019-04-28 HISTORY — PX: COLONOSCOPY WITH PROPOFOL: SHX5780

## 2019-04-28 LAB — CALR + JAK2 E12-15 + MPL (REFLEXED)

## 2019-04-28 LAB — JAK2 V617F, W REFLEX TO CALR/E12/MPL

## 2019-04-28 SURGERY — COLONOSCOPY WITH PROPOFOL
Anesthesia: General

## 2019-04-28 MED ORDER — PROPOFOL 10 MG/ML IV BOLUS
INTRAVENOUS | Status: DC | PRN
  Administered 2019-04-28 (×3): 50 mg via INTRAVENOUS

## 2019-04-28 MED ORDER — PROPOFOL 500 MG/50ML IV EMUL
INTRAVENOUS | Status: AC
  Filled 2019-04-28: qty 50

## 2019-04-28 MED ORDER — SODIUM CHLORIDE 0.9 % IV SOLN
INTRAVENOUS | Status: DC
  Administered 2019-04-28: 1000 mL via INTRAVENOUS

## 2019-04-28 MED ORDER — LIDOCAINE HCL (CARDIAC) PF 100 MG/5ML IV SOSY
PREFILLED_SYRINGE | INTRAVENOUS | Status: DC | PRN
  Administered 2019-04-28: 60 mg via INTRAVENOUS

## 2019-04-28 MED ORDER — PROPOFOL 10 MG/ML IV BOLUS
INTRAVENOUS | Status: AC
  Filled 2019-04-28: qty 20

## 2019-04-28 MED ORDER — GLYCOPYRROLATE 0.2 MG/ML IJ SOLN
INTRAMUSCULAR | Status: DC | PRN
  Administered 2019-04-28: .2 mg via INTRAVENOUS

## 2019-04-28 MED ORDER — GLYCOPYRROLATE 0.2 MG/ML IJ SOLN
INTRAMUSCULAR | Status: AC
  Filled 2019-04-28: qty 1

## 2019-04-28 MED ORDER — PHENYLEPHRINE HCL (PRESSORS) 10 MG/ML IV SOLN
INTRAVENOUS | Status: AC
  Filled 2019-04-28: qty 1

## 2019-04-28 MED ORDER — PHENYLEPHRINE HCL (PRESSORS) 10 MG/ML IV SOLN
INTRAVENOUS | Status: DC | PRN
  Administered 2019-04-28: 100 ug via INTRAVENOUS

## 2019-04-28 MED ORDER — PROPOFOL 500 MG/50ML IV EMUL
INTRAVENOUS | Status: DC | PRN
  Administered 2019-04-28: 125 ug/kg/min via INTRAVENOUS

## 2019-04-28 MED ORDER — EPHEDRINE 5 MG/ML INJ
INTRAVENOUS | Status: AC
  Filled 2019-04-28: qty 10

## 2019-04-28 NOTE — Anesthesia Preprocedure Evaluation (Signed)
Anesthesia Evaluation  Patient identified by MRN, date of birth, ID band Patient awake    Reviewed: Allergy & Precautions, H&P , NPO status , Patient's Chart, lab work & pertinent test results, reviewed documented beta blocker date and time   Airway Mallampati: II  TM Distance: >3 FB Neck ROM: full    Dental no notable dental hx.    Pulmonary shortness of breath (with asthma), asthma , neg sleep apnea, neg recent URI,    Pulmonary exam normal breath sounds clear to auscultation       Cardiovascular Exercise Tolerance: Good negative cardio ROS Normal cardiovascular exam Rhythm:regular Rate:Normal     Neuro/Psych negative neurological ROS  negative psych ROS   GI/Hepatic Neg liver ROS, GERD  ,  Endo/Other  negative endocrine ROS  Renal/GU negative Renal ROS  negative genitourinary   Musculoskeletal   Abdominal   Peds  Hematology negative hematology ROS (+)   Anesthesia Other Findings Past Medical History: No date: Allergy No date: Arthritis No date: Asthma No date: Chicken pox 2009: Diverticulitis   Reproductive/Obstetrics negative OB ROS                             Anesthesia Physical Anesthesia Plan  ASA: II  Anesthesia Plan: General   Post-op Pain Management:    Induction: Intravenous  PONV Risk Score and Plan: 3 and Propofol infusion and TIVA  Airway Management Planned: Natural Airway and Nasal Cannula  Additional Equipment:   Intra-op Plan:   Post-operative Plan:   Informed Consent: I have reviewed the patients History and Physical, chart, labs and discussed the procedure including the risks, benefits and alternatives for the proposed anesthesia with the patient or authorized representative who has indicated his/her understanding and acceptance.     Dental Advisory Given  Plan Discussed with: Anesthesiologist, CRNA and Surgeon  Anesthesia Plan Comments:          Anesthesia Quick Evaluation

## 2019-04-28 NOTE — Op Note (Signed)
[facility] Gastroenterology Patient Name: Lynn Peters Procedure Date: 04/28/2019 7:38 AM MRN: [id number] Account #: [id number] Date of Birth: 1955-09-26 Admit Type: Outpatient Age: 64 Room: [facility] ENDO ROOM 4 Gender: Female Note Status: Finalized Procedure:             Upper GI endoscopy Indications:           Abdominal bloating, Diarrhea Providers:             Lin Landsman MD, MD Referring MD:          Yvetta Coder. Arnett (Referring MD) Medicines:             Monitored Anesthesia Care Complications:         No immediate complications. Estimated blood loss: None. Procedure:             Pre-Anesthesia Assessment:                        - Prior to the procedure, a History and Physical was                         performed, and patient medications and allergies were                         reviewed. The patient is competent. The risks and                         benefits of the procedure and the sedation options and                         risks were discussed with the patient. All questions                         were answered and informed consent was obtained.                         Patient identification and proposed procedure were                         verified by the physician, the nurse, the                         anesthesiologist, the anesthetist and the technician                         in the pre-procedure area in the procedure room in the                         endoscopy suite. Mental Status Examination: alert and                         oriented. Airway Examination: normal oropharyngeal                         airway and neck mobility. Respiratory Examination:                         clear to auscultation. CV Examination: normal.  Prophylactic Antibiotics: The patient does not require                         prophylactic antibiotics. Prior Anticoagulants: The                         patient has taken no previous  anticoagulant or                         antiplatelet agents. ASA Grade Assessment: II - A                         patient with mild systemic disease. After reviewing                         the risks and benefits, the patient was deemed in                         satisfactory condition to undergo the procedure. The                         anesthesia plan was to use monitored anesthesia care                         (MAC). Immediately prior to administration of                         medications, the patient was re-assessed for adequacy                         to receive sedatives. The heart rate, respiratory                         rate, oxygen saturations, blood pressure, adequacy of                         pulmonary ventilation, and response to care were                         monitored throughout the procedure. The physical                         status of the patient was re-assessed after the                         procedure.                        After obtaining informed consent, the endoscope was                         passed under direct vision. Throughout the procedure,                         the patient's blood pressure, pulse, and oxygen                         saturations were monitored continuously. The Endoscope  was introduced through the mouth, and advanced to the                         second part of duodenum. The upper GI endoscopy was                         accomplished without difficulty. The patient tolerated                         the procedure well. Findings:      The esophagus was normal.      The stomach was normal.      The examined duodenum was normal. Biopsies for histology were taken with       a cold forceps for evaluation of celiac disease. Impression:            - Normal esophagus.                        - Normal stomach.                        - Normal examined duodenum.                        - No specimens  collected. Recommendation:        - Await pathology results.                        - Proceed with colonoscopy as scheduled                        See colonoscopy report Procedure Code(s):     --- Professional ---                        [phone], Esophagogastroduodenoscopy, flexible,                         transoral; with biopsy, single or multiple Diagnosis Code(s):     --- Professional ---                        R14.0, Abdominal distension (gaseous)                        R19.7, Diarrhea, unspecified CPT copyright 2019 American Medical Association. All rights reserved. The codes documented in this report are preliminary and upon coder review may  be revised to meet current compliance requirements. Dr. Ulyess Mort Lin Landsman MD, MD 04/28/2019 8:39:44 AM This report has been signed electronically. Number of Addenda: 0 Note Initiated On: 04/28/2019 7:38 AM Estimated Blood Loss:  Estimated blood loss: none.      [facility]

## 2019-04-28 NOTE — H&P (Signed)
Lynn Darby, MD [address]  [place]  [place], [place] [address]  Main: [phone]  Fax: [phone] Pager: [phone]  Primary Care Physician:  Burnard Hawthorne, FNP Primary Gastroenterologist:  Dr. Cephas Peters  Pre-Procedure History & Physical: HPI:  Lynn Peters is a 64 y.o. female is here for an endoscopy and colonoscopy.   Past Medical History:  Diagnosis Date  . Allergy   . Arthritis   . Asthma   . Chicken pox   . Diverticulitis 2009    Past Surgical History:  Procedure Laterality Date  . ABDOMINAL HYSTERECTOMY  2004   partial; has cervix and ovaries per patient; done for fibroids, noncancerous  . ANTERIOR CRUCIATE LIGAMENT REPAIR Right 2000    Prior to Admission medications   Medication Sig Start Date End Date Taking? Authorizing Provider  albuterol (VENTOLIN HFA) 108 (90 Base) MCG/ACT inhaler INHALE 2 PUFFS INTO THE LUNGS EVERY 4 (FOUR) HOURS AS NEEDED FOR WHEEZING OR SHORTNESS OF BREATH. 10/16/18  Yes Arnett, Yvetta Coder, FNP  AMBULATORY NON FORMULARY MEDICATION Medication Name: Spacer to be used with Flovent inhaler. 01/14/17  Yes Wilhelmina Mcardle, MD  beclomethasone (QVAR REDIHALER) 80 MCG/ACT inhaler Inhale 2 puffs into the lungs 2 (two) times daily. 03/11/19  Yes Tyler Pita, MD  cetirizine HCl (ZYRTEC CHILDRENS ALLERGY) 5 MG/5ML SOLN Take 2.5 mg by mouth daily.   Yes [provider]  famotidine (PEPCID) 20 MG tablet Take 1 tablet (20 mg total) by mouth daily. 02/14/17  Yes Crecencio Mc, MD  ipratropium-albuterol (DUONEB) 0.5-2.5 (3) MG/3ML SOLN Take 3 mLs by nebulization every 8 (eight) hours as needed. 06/13/16  Yes Arnett, Yvetta Coder, FNP  phenazopyridine (PYRIDIUM) 200 MG tablet Take 1 tablet (200 mg total) by mouth 3 (three) times daily as needed for pain. 12/18/17  Yes Guse, Jacquelynn Cree, FNP  Probiotic Product (SOLUBLE FIBER/PROBIOTICS PO) Take by mouth.   Yes [provider]  Tiotropium Bromide Monohydrate (SPIRIVA  RESPIMAT) 1.25 MCG/ACT AERS Inhale 2 puffs into the lungs daily. 03/11/19  Yes Tyler Pita, MD  Tiotropium Bromide Monohydrate (SPIRIVA RESPIMAT) 1.25 MCG/ACT AERS Inhale 2 puffs into the lungs daily. Patient not taking: Reported on 04/15/2019 03/11/19   Tyler Pita, MD    Allergies as of 04/14/2019 - Review Complete 04/13/2019  Allergen Reaction Noted  . Pantoprazole sodium Shortness Of Breath 08/29/2017  . Flagyl [metronidazole]  01/22/2016  . Tetracyclines & related  08/05/2016  . Breo ellipta [fluticasone furoate-vilanterol] Palpitations 03/20/2016    Family History  Problem Relation Age of Onset  . Alzheimer's disease Mother   . Asthma Mother   . Diabetes Mother   . Hyperlipidemia Mother   . Hypertension Mother   . Stroke Mother   . Heart disease Father        CHF  . Diabetes Father   . Hyperlipidemia Father   . Alcohol abuse Brother   . Heart disease Brother   . Asthma Sister   . Diabetes Sister   . Breast cancer Neg Hx     Social History   Socioeconomic History  . Marital status: Single    Spouse name: Not on file  . Number of children: Not on file  . Years of education: Not on file  . Highest education level: Not on file  Occupational History  . Not on file  Tobacco Use  . Smoking status: Never Smoker  . Smokeless tobacco: Never Used  Substance and Sexual Activity  .  Alcohol use: Yes    Alcohol/week: 0.0 standard drinks  . Drug use: No  . Sexual activity: Yes    Partners: Male    Birth control/protection: Surgical  Other Topics Concern  . 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      Diet- gym, knee limits running, paddleboating. Interval training 3x per week.       Diet-regular      Social Determinants of Health   Financial Resource Strain:   . Difficulty of Paying Living Expenses:   Food Insecurity:   . Worried About Charity fundraiser  in the Last Year:   . Arboriculturist in the Last Year:   Transportation Needs:   . Film/video editor (Medical):   Marland Kitchen Lack of Transportation (Non-Medical):   Physical Activity:   . Days of Exercise per Week:   . Minutes of Exercise per Session:   Stress:   . Feeling of Stress :   Social Connections:   . Frequency of Communication with Friends and Family:   . Frequency of Social Gatherings with Friends and Family:   . Attends Religious Services:   . Active Member of Clubs or Organizations:   . Attends Archivist Meetings:   Marland Kitchen Marital Status:   Intimate Partner Violence:   . Fear of Current or Ex-Partner:   . Emotionally Abused:   Marland Kitchen Physically Abused:   . Sexually Abused:     Review of Systems: See HPI, otherwise negative ROS  Physical Exam: BP 139/88   Pulse (!) 103   Temp (!) 96.2 F (35.7 C) (Temporal)   Resp 20   Ht 5\' 5"  (1.651 m)   Wt 70.1 kg   SpO2 100%   BMI 25.73 kg/m  General:   Alert,  pleasant and cooperative in NAD Head:  Normocephalic and atraumatic. Neck:  Supple; no masses or thyromegaly. Lungs:  Clear throughout to auscultation.    Heart:  Regular rate and rhythm. Abdomen:  Soft, nontender and nondistended. Normal bowel sounds, without guarding, and without rebound.   Neurologic:  Alert and  oriented x4;  grossly normal neurologically.  Impression/Plan: Lynn Peters is here for an endoscopy and colonoscopy to be performed for colon cancer screening, chron diarrhea, abdominal bloating  Risks, benefits, limitations, and alternatives regarding  endoscopy and colonoscopy have been reviewed with the patient.  Questions have been answered.  All parties agreeable.   Sherri Sear, MD  04/28/2019, 8:17 AM

## 2019-04-28 NOTE — Anesthesia Postprocedure Evaluation (Signed)
Anesthesia Post Note  Patient: Kelyse Kerrigan  Procedure(s) Performed: COLONOSCOPY WITH PROPOFOL (N/A ) ESOPHAGOGASTRODUODENOSCOPY (EGD) WITH PROPOFOL (N/A )  Patient location during evaluation: Endoscopy Anesthesia Type: General Level of consciousness: awake and alert Pain management: pain level controlled Vital Signs Assessment: post-procedure vital signs reviewed and stable Respiratory status: spontaneous breathing, nonlabored ventilation, respiratory function stable and patient connected to nasal cannula oxygen Cardiovascular status: blood pressure returned to baseline and stable Postop Assessment: no apparent nausea or vomiting Anesthetic complications: no     Last Vitals:  Vitals:   04/28/19 0916 04/28/19 0926  BP: (!) 124/109 128/80  Pulse: 90 87  Resp: 17 18  Temp:    SpO2: 98% 99%    Last Pain:  Vitals:   04/28/19 0926  TempSrc:   PainSc: 0-No pain                 Martha Clan

## 2019-04-28 NOTE — Op Note (Signed)
[facility] Gastroenterology Patient Name: Lynn Peters Procedure Date: 04/28/2019 7:37 AM MRN: [id number] Account #: [id number] Date of Birth: September 11, 1955 Admit Type: Outpatient Age: 64 Room: [facility] ENDO ROOM 4 Gender: Female Note Status: Finalized Procedure:             Colonoscopy Indications:           Screening for colorectal malignant neoplasm Providers:             Lin Landsman MD, MD Medicines:             Monitored Anesthesia Care Complications:         No immediate complications. Estimated blood loss: None. Procedure:             Pre-Anesthesia Assessment:                        - Prior to the procedure, a History and Physical was                         performed, and patient medications and allergies were                         reviewed. The patient is competent. The risks and                         benefits of the procedure and the sedation options and                         risks were discussed with the patient. All questions                         were answered and informed consent was obtained.                         Patient identification and proposed procedure were                         verified by the physician, the nurse, the                         anesthesiologist, the anesthetist and the technician                         in the pre-procedure area in the procedure room in the                         endoscopy suite. Mental Status Examination: alert and                         oriented. Airway Examination: normal oropharyngeal                         airway and neck mobility. Respiratory Examination:                         clear to auscultation. CV Examination: normal.                         Prophylactic Antibiotics: The patient does not  require                         prophylactic antibiotics. Prior Anticoagulants: The                         patient has taken no previous anticoagulant or                         antiplatelet agents.  ASA Grade Assessment: II - A                         patient with mild systemic disease. After reviewing                         the risks and benefits, the patient was deemed in                         satisfactory condition to undergo the procedure. The                         anesthesia plan was to use monitored anesthesia care                         (MAC). Immediately prior to administration of                         medications, the patient was re-assessed for adequacy                         to receive sedatives. The heart rate, respiratory                         rate, oxygen saturations, blood pressure, adequacy of                         pulmonary ventilation, and response to care were                         monitored throughout the procedure. The physical                         status of the patient was re-assessed after the                         procedure.                        After obtaining informed consent, the colonoscope was                         passed under direct vision. Throughout the procedure,                         the patient's blood pressure, pulse, and oxygen                         saturations were monitored continuously. The  Colonoscope was introduced through the anus and                         advanced to the the terminal ileum, with                         identification of the appendiceal orifice and IC                         valve. The colonoscopy was performed without                         difficulty. The patient tolerated the procedure well.                         The quality of the bowel preparation was evaluated                         using the BBPS [facility] Bowel Preparation Scale) with                         scores of: Right Colon = 3, Transverse Colon = 3 and                         Left Colon = 3 (entire mucosa seen well with no                         residual staining, small fragments of stool or opaque                          liquid). The total BBPS score equals 9. Findings:      The perianal and digital rectal examinations were normal. Pertinent       negatives include normal sphincter tone and no palpable rectal lesions.      The terminal ileum appeared normal.      A diminutive polyp was found in the descending colon. The polyp was       sessile. The polyp was removed with a cold biopsy forceps. Resection and       retrieval were complete.      Normal mucosa was found in the entire colon. Biopsies for histology were       taken with a cold forceps from the entire colon for evaluation of       microscopic colitis.      Multiple diverticula were found in the sigmoid colon.      The retroflexed view of the distal rectum and anal verge was normal and       showed no anal or rectal abnormalities. Impression:            - The examined portion of the ileum was normal.                        - One diminutive polyp in the descending colon,                         removed with a cold biopsy forceps. Resected and  retrieved.                        - Normal mucosa in the entire examined colon. Biopsied.                        - Diverticulosis in the sigmoid colon.                        - The distal rectum and anal verge are normal on                         retroflexion view. Recommendation:        - Discharge patient to home (with escort).                        - Resume previous diet today.                        - Continue present medications.                        - Await pathology results.                        - Repeat colonoscopy in 7-10 years for surveillance                         based on pathology results.                        - Return to my office as previously scheduled. Procedure Code(s):     --- Professional ---                        [phone], Colonoscopy, flexible; with biopsy, single or                         multiple Diagnosis Code(s):     ---  Professional ---                        Z12.11, Encounter for screening for malignant neoplasm                         of colon                        K63.5, Polyp of colon                        K57.30, Diverticulosis of large intestine without                         perforation or abscess without bleeding CPT copyright 2019 American Medical Association. All rights reserved. The codes documented in this report are preliminary and upon coder review may  be revised to meet current compliance requirements. Dr. Ulyess Mort Lin Landsman MD, MD 04/28/2019 9:03:08 AM This report has been signed electronically. Number of Addenda: 0 Note Initiated On: 04/28/2019 7:37 AM Scope Withdrawal Time: 0 hours 12 minutes 46 seconds  Total Procedure Duration: 0 hours 18 minutes 9 seconds  Estimated Blood Loss:  Estimated blood loss: none.      [facility]

## 2019-04-28 NOTE — Transfer of Care (Signed)
Immediate Anesthesia Transfer of Care Note  Patient: Lynn Peters  Procedure(s) Performed: COLONOSCOPY WITH PROPOFOL (N/A ) ESOPHAGOGASTRODUODENOSCOPY (EGD) WITH PROPOFOL (N/A )  Patient Location: PACU  Anesthesia Type:General  Level of Consciousness: sedated  Airway & Oxygen Therapy: Patient Spontanous Breathing and Patient connected to nasal cannula oxygen  Post-op Assessment: Report given to RN and Post -op Vital signs reviewed and stable  Post vital signs: Reviewed and stable  Last Vitals:  Vitals Value Taken Time  BP 122/83 04/28/19 0906  Temp 36.4 C 04/28/19 0906  Pulse 87 04/28/19 0907  Resp 17 04/28/19 0907  SpO2 97 % 04/28/19 0907  Vitals shown include unvalidated device data.  Last Pain:  Vitals:   04/28/19 0906  TempSrc:   PainSc: Asleep         Complications: No apparent anesthesia complications

## 2019-04-29 ENCOUNTER — Encounter: Payer: Self-pay | Admitting: Gastroenterology

## 2019-04-29 ENCOUNTER — Encounter: Payer: Self-pay | Admitting: *Deleted

## 2019-04-29 LAB — SURGICAL PATHOLOGY

## 2019-04-30 ENCOUNTER — Telehealth: Payer: Self-pay

## 2019-04-30 NOTE — Telephone Encounter (Signed)
Called and left a message for call back  

## 2019-04-30 NOTE — Telephone Encounter (Signed)
Patient states no one told her  Patient is still having right lower side pain. Patient states this has not got better for several months. Patient states she does not have spasms in her stomach to use the Bentyl. Patient states no one told her she had IBS and does not want to use any medication for the diarrhea

## 2019-04-30 NOTE — Telephone Encounter (Signed)
-----   Message from Lin Landsman, MD sent at 04/29/2019  4:42 PM EDT ----- Please ask patient if she would be willing to try any medication for diarrhea.  Her diarrhea is most likely secondary to irritable bowel syndrome.  Pathology results came back normal.  Rohini Vanga

## 2019-04-30 NOTE — Telephone Encounter (Signed)
Please arrange a televisit for next week  RV

## 2019-05-01 ENCOUNTER — Encounter: Payer: Self-pay | Admitting: Family

## 2019-05-03 ENCOUNTER — Encounter: Payer: Self-pay | Admitting: Oncology

## 2019-05-03 ENCOUNTER — Other Ambulatory Visit: Payer: Self-pay

## 2019-05-03 ENCOUNTER — Encounter: Payer: Self-pay | Admitting: Gastroenterology

## 2019-05-03 ENCOUNTER — Inpatient Hospital Stay (HOSPITAL_BASED_OUTPATIENT_CLINIC_OR_DEPARTMENT_OTHER): Payer: 59 | Admitting: Oncology

## 2019-05-03 DIAGNOSIS — K580 Irritable bowel syndrome with diarrhea: Secondary | ICD-10-CM

## 2019-05-03 DIAGNOSIS — D751 Secondary polycythemia: Secondary | ICD-10-CM

## 2019-05-03 DIAGNOSIS — J45909 Unspecified asthma, uncomplicated: Secondary | ICD-10-CM

## 2019-05-03 MED ORDER — DICYCLOMINE HCL 10 MG PO CAPS: 10.0000 mg | ORAL_CAPSULE | Freq: Three times a day (TID) | ORAL | 2 refills | Status: DC

## 2019-05-03 NOTE — Progress Notes (Signed)
3 attempts at contacting patient to update Lynn Peters chart for the virtual visit with Dr. Tasia Catchings at 2:30 today.  Was able to leave a voicemail on the first 2 attempts but the voicemail was full and unable to leave a message on 3rd attempt.

## 2019-05-03 NOTE — Telephone Encounter (Signed)
Called patient and discussed regarding her endoscopy and colonoscopy results including pathology results. She probably has irritable bowel syndrome as work-up has been unremarkable so far.  I explained to her that IBS can be symptomatically managed and we can try dicyclomine.  Patient is willing to try  Cephas Darby, MD [address]  [place]  [place], [place] [address]  Main: [phone]  Fax: [phone] Pager: [phone]

## 2019-05-03 NOTE — Telephone Encounter (Signed)
Called and left a message for call back  

## 2019-05-04 NOTE — Progress Notes (Signed)
HEMATOLOGY-ONCOLOGY TeleHEALTH VISIT PROGRESS NOTE  I connected with Lynn Peters on 05/04/19 at  2:30 PM EDT by video enabled telemedicine visit and verified that I am speaking with the correct person using two identifiers. I discussed the limitations, risks, security and privacy concerns of performing an evaluation and management service by telemedicine and the availability of in-person appointments. I also discussed with the patient that there may be a patient responsible charge related to this service. The patient expressed understanding and agreed to proceed.   Other persons participating in the visit and their role in the encounter:  None  Patient's location: Home  Provider's location: office Chief Complaint: Erythrocytosis   INTERVAL HISTORY Lynn Peters is a 64 y.o. female who has above history reviewed by me today presents for follow up visit for management of erythrocytosis Problems and complaints are listed below: Patient has had blood work done during interval and present virtually to discuss lab results and management plan.  She has no new complaints.  Review of Systems  Constitutional: Negative for appetite change, chills, fatigue and fever.  HENT:   Negative for hearing loss and voice change.   Eyes: Negative for eye problems.  Respiratory: Negative for chest tightness and cough.   Cardiovascular: Negative for chest pain.  Gastrointestinal: Negative for abdominal distention, abdominal pain and blood in stool.  Endocrine: Negative for hot flashes.  Genitourinary: Negative for difficulty urinating and frequency.   Musculoskeletal: Negative for arthralgias.  Skin: Negative for itching and rash.  Neurological: Negative for extremity weakness.  Hematological: Negative for adenopathy.  Psychiatric/Behavioral: Negative for confusion.    Past Medical History:  Diagnosis Date  . Allergy   . Arthritis   . Asthma   . Chicken pox   . Diverticulitis 2009   Past Surgical History:   Procedure Laterality Date  . ABDOMINAL HYSTERECTOMY  2004   partial; has cervix and ovaries per patient; done for fibroids, noncancerous  . ANTERIOR CRUCIATE LIGAMENT REPAIR Right 2000  . COLONOSCOPY WITH PROPOFOL N/A 04/28/2019   Procedure: COLONOSCOPY WITH PROPOFOL;  Surgeon: Lin Landsman, MD;  Location: [facility] ENDOSCOPY;  Service: Gastroenterology;  Laterality: N/A;  . ESOPHAGOGASTRODUODENOSCOPY (EGD) WITH PROPOFOL N/A 04/28/2019   Procedure: ESOPHAGOGASTRODUODENOSCOPY (EGD) WITH PROPOFOL;  Surgeon: Lin Landsman, MD;  Location: [facility] ENDOSCOPY;  Service: Gastroenterology;  Laterality: N/A;    Family History  Problem Relation Age of Onset  . Alzheimer's disease Mother   . Asthma Mother   . Diabetes Mother   . Hyperlipidemia Mother   . Hypertension Mother   . Stroke Mother   . Heart disease Father        CHF  . Diabetes Father   . Hyperlipidemia Father   . Alcohol abuse Brother   . Heart disease Brother   . Asthma Sister   . Diabetes Sister   . Breast cancer Neg Hx     Social History   Socioeconomic History  . Marital status: Single    Spouse name: Not on file  . Number of children: Not on file  . Years of education: Not on file  . Highest education level: Not on file  Occupational History  . Not on file  Tobacco Use  . Smoking status: Never Smoker  . Smokeless tobacco: Never Used  Substance and Sexual Activity  . Alcohol use: Yes    Alcohol/week: 0.0 standard drinks  . Drug use: No  . Sexual activity: Yes    Partners: Male    Birth control/protection:  Surgical  Other Topics Concern  . 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      Diet- gym, knee limits running, paddleboating. Interval training 3x per week.       Diet-regular      Social Determinants of Health   Financial Resource Strain:   . Difficulty of Paying Living Expenses:   Food Insecurity:    . Worried About Charity fundraiser in the Last Year:   . Arboriculturist in the Last Year:   Transportation Needs:   . Film/video editor (Medical):   Marland Kitchen Lack of Transportation (Non-Medical):   Physical Activity:   . Days of Exercise per Week:   . Minutes of Exercise per Session:   Stress:   . Feeling of Stress :   Social Connections:   . Frequency of Communication with Friends and Family:   . Frequency of Social Gatherings with Friends and Family:   . Attends Religious Services:   . Active Member of Clubs or Organizations:   . Attends Archivist Meetings:   Marland Kitchen Marital Status:   Intimate Partner Violence:   . Fear of Current or Ex-Partner:   . Emotionally Abused:   Marland Kitchen Physically Abused:   . Sexually Abused:     Current Outpatient Medications on File Prior to Visit  Medication Sig Dispense Refill  . albuterol (VENTOLIN HFA) 108 (90 Base) MCG/ACT inhaler INHALE 2 PUFFS INTO THE LUNGS EVERY 4 (FOUR) HOURS AS NEEDED FOR WHEEZING OR SHORTNESS OF BREATH. 18 g 2  . beclomethasone (QVAR REDIHALER) 80 MCG/ACT inhaler Inhale 2 puffs into the lungs 2 (two) times daily. 10.6 g 6  . cetirizine HCl (ZYRTEC CHILDRENS ALLERGY) 5 MG/5ML SOLN Take 2.5 mg by mouth daily.    . famotidine (PEPCID) 20 MG tablet Take 1 tablet (20 mg total) by mouth daily. 30 tablet 2  . ipratropium-albuterol (DUONEB) 0.5-2.5 (3) MG/3ML SOLN Take 3 mLs by nebulization every 8 (eight) hours as needed. 360 mL 11  . phenazopyridine (PYRIDIUM) 200 MG tablet Take 1 tablet (200 mg total) by mouth 3 (three) times daily as needed for pain. 10 tablet 0  . Probiotic Product (SOLUBLE FIBER/PROBIOTICS PO) Take by mouth.    . Tiotropium Bromide Monohydrate (SPIRIVA RESPIMAT) 1.25 MCG/ACT AERS Inhale 2 puffs into the lungs daily. (Patient not taking: Reported on 05/03/2019) 4 g 6  . Tiotropium Bromide Monohydrate (SPIRIVA RESPIMAT) 1.25 MCG/ACT AERS Inhale 2 puffs into the lungs daily. (Patient not taking: Reported on  04/15/2019) 4 g 0   Current Facility-Administered Medications on File Prior to Visit  Medication Dose Route Frequency Provider Last Rate Last Admin  . ipratropium-albuterol (DUONEB) 0.5-2.5 (3) MG/3ML nebulizer solution 3 mL  3 mL Nebulization Q6H Burnard Hawthorne, FNP        Allergies  Allergen Reactions  . Pantoprazole Sodium Shortness Of Breath  . Tetracyclines & Related   . Breo Ellipta [Fluticasone Furoate-Vilanterol] Palpitations  . Flagyl [Metronidazole] Other (See Comments)    Neck pain, spasm       Observations/Objective: There were no vitals filed for this visit. There is no height or weight on file to calculate BMI.  Physical Exam  Constitutional: No distress.  Neurological: She is alert.    CBC    Component Value Date/Time   WBC 8.0 04/19/2019 1122  RBC 5.12 (H) 04/19/2019 1122   HGB 15.1 (H) 04/19/2019 1122   HCT 47.6 (H) 04/19/2019 1122   PLT 334 04/19/2019 1122   MCV 93.0 04/19/2019 1122   MCH 29.5 04/19/2019 1122   MCHC 31.7 04/19/2019 1122   RDW 12.5 04/19/2019 1122   LYMPHSABS 2.4 04/19/2019 1122   MONOABS 0.7 04/19/2019 1122   EOSABS 0.1 04/19/2019 1122   BASOSABS 0.0 04/19/2019 1122    CMP     Component Value Date/Time   NA 139 04/07/2019 1243   K 3.8 04/07/2019 1243   CL 101 04/07/2019 1243   CO2 28 04/07/2019 1243   GLUCOSE 88 04/07/2019 1243   BUN 5 (L) 04/07/2019 1243   CREATININE 0.70 04/07/2019 1454   CALCIUM 10.0 04/07/2019 1243   PROT 7.5 04/07/2019 1243   ALBUMIN 4.3 04/07/2019 1243   AST 26 04/07/2019 1243   ALT 24 04/07/2019 1243   ALKPHOS 79 04/07/2019 1243   BILITOT 0.6 04/07/2019 1243     Assessment and Plan: 1. Erythrocytosis   2. Uncomplicated asthma, unspecified asthma severity, unspecified whether persistent     Patient had erythrocytosis work-up done. Labs reviewed and discussed with patient. JAK2 V617F mutation negative, with reflex to other mutations CALR, MPL, JAK 2 Ex 12-15 mutations negative. Normal carbo  monoxide level.  She is a non-smoker Smear showed no unremarkable morphology. Sleep study was done in 2019 which did not demonstrate any significant obstructive or central sleep disorder. Patient has asthma and was seen by Dr. Patsey Berthold in January 2021. Communicated with Dr. Patsey Berthold and she does not think any of her pulmonary conditions may contribute to erythrocytosis.  Patient has asthma.  If no other etiologies are discovered, I recommend to obtain additional work-up for familial erythropoietin receptor mutation testing which are send out test to [facility]. Patient prefers to be watched for now and I think is very reasonable given her chronic erythrocytosis is stable.  Follow Up Instructions: 6 months   I discussed the assessment and treatment plan with the patient. The patient was provided an opportunity to ask questions and all were answered. The patient agreed with the plan and demonstrated an understanding of the instructions.  The patient was advised to call back or seek an in-person evaluation if the symptoms worsen or if the condition fails to improve as anticipated.    Earlie Server, MD 05/04/2019 10:47 AM

## 2019-05-05 ENCOUNTER — Ambulatory Visit: Payer: 59 | Admitting: Gastroenterology

## 2019-06-07 ENCOUNTER — Encounter: Payer: Self-pay | Admitting: Gastroenterology

## 2019-06-10 ENCOUNTER — Ambulatory Visit: Payer: 59 | Admitting: Gastroenterology

## 2019-10-21 ENCOUNTER — Encounter: Payer: Self-pay | Admitting: Family

## 2019-11-01 ENCOUNTER — Inpatient Hospital Stay (HOSPITAL_BASED_OUTPATIENT_CLINIC_OR_DEPARTMENT_OTHER): Payer: 59 | Admitting: Oncology

## 2019-11-01 ENCOUNTER — Encounter: Payer: Self-pay | Admitting: Oncology

## 2019-11-01 ENCOUNTER — Other Ambulatory Visit: Payer: Self-pay

## 2019-11-01 ENCOUNTER — Inpatient Hospital Stay: Payer: 59 | Attending: Oncology

## 2019-11-01 VITALS — BP 135/95 | HR 76 | Temp 98.1°F | Resp 18 | Wt 151.2 lb

## 2019-11-01 DIAGNOSIS — Z90710 Acquired absence of both cervix and uterus: Secondary | ICD-10-CM | POA: Diagnosis not present

## 2019-11-01 DIAGNOSIS — J45909 Unspecified asthma, uncomplicated: Secondary | ICD-10-CM | POA: Diagnosis not present

## 2019-11-01 DIAGNOSIS — D751 Secondary polycythemia: Secondary | ICD-10-CM

## 2019-11-01 LAB — CBC WITH DIFFERENTIAL/PLATELET
Abs Immature Granulocytes: 0.04 10*3/uL (ref 0.00–0.07)
Basophils Absolute: 0.1 10*3/uL (ref 0.0–0.1)
Basophils Relative: 1 %
Eosinophils Absolute: 0.2 10*3/uL (ref 0.0–0.5)
Eosinophils Relative: 2 %
HCT: 44.2 % (ref 36.0–46.0)
Hemoglobin: 14.9 g/dL (ref 12.0–15.0)
Immature Granulocytes: 0 %
Lymphocytes Relative: 29 %
Lymphs Abs: 2.6 10*3/uL (ref 0.7–4.0)
MCH: 30 pg (ref 26.0–34.0)
MCHC: 33.7 g/dL (ref 30.0–36.0)
MCV: 89.1 fL (ref 80.0–100.0)
Monocytes Absolute: 0.8 10*3/uL (ref 0.1–1.0)
Monocytes Relative: 9 %
Neutro Abs: 5.3 10*3/uL (ref 1.7–7.7)
Neutrophils Relative %: 59 %
Platelets: 321 10*3/uL (ref 150–400)
RBC: 4.96 MIL/uL (ref 3.87–5.11)
RDW: 12.8 % (ref 11.5–15.5)
WBC: 8.9 10*3/uL (ref 4.0–10.5)
nRBC: 0 % (ref 0.0–0.2)

## 2019-11-01 NOTE — Progress Notes (Signed)
Hematology/Oncology follow up note [facility] Telephone:([phone]) [phone] Fax:(336) [phone]   Patient Care Team: Burnard Hawthorne, FNP as PCP - General (Family Medicine)  REFERRING PROVIDER: Burnard Hawthorne, FNP  CHIEF COMPLAINTS/REASON FOR VISIT:  Follow up  polycytosis/erythrocytosis  HISTORY OF PRESENTING ILLNESS:  Lynn Peters is a 64 y.o. female who was seen in consultation at the request of Burnard Hawthorne, FNP for evaluation of polycytosis/erythrocytosis Reviewed patient's recent lab work which was obtain by PCP.  04/07/2019 labs showed elevated hemoglobin at 15.7,  total white count 7.6, platelet counts 354,000. Erythrocytosis, ischronic Onset, duration since at least 2017 at that time with a hemoglobin of 16. No aggravating or alleviating factors.   Associated signs or symptoms: Denies weight loss, fever, chills, fatigue, night sweats.   Context:  Smoking history: Never smoker.  She denies alcohol use.  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 History of blood clots: Denies Daytime somnolence: Denies Family history of polycythemia: Denies  Reports chronic abdominal pain.  Sometimes she has nonbloody diarrhea.  Patient was recently seen by gastroenterology Dr. Marius Ditch.  GI recommends stool studies to rule out infection, perform H. pylori breath test, recommend EGD and colonoscopy for further evaluation. Patient is a vegan   INTERVAL HISTORY Lynn Peters is a 64 y.o. female who has above history reviewed by me today presents for follow up visit for erythrocytosis Problems and complaints are listed below: Patient reports feeling well today.  No new complaints.  She tells me that she is fully vaccinated with 2 doses of Covid 19 vaccination. Chronic asthma, symptoms are stable.   Review of Systems  Constitutional: Negative for appetite change, chills, fatigue and fever.  HENT:   Negative for hearing loss and voice change.    Eyes: Negative for eye problems.  Respiratory: Negative for chest tightness and cough.   Cardiovascular: Negative for chest pain.  Gastrointestinal: Negative for abdominal distention, abdominal pain and blood in stool.  Endocrine: Negative for hot flashes.  Genitourinary: Negative for difficulty urinating and frequency.   Musculoskeletal: Negative for arthralgias.  Skin: Negative for itching and rash.  Neurological: Negative for extremity weakness.  Hematological: Negative for adenopathy.  Psychiatric/Behavioral: Negative for confusion.    MEDICAL HISTORY:  Past Medical History:  Diagnosis Date  . Allergy   . Arthritis   . Asthma   . Chicken pox   . Diverticulitis 2009    SURGICAL HISTORY: Past Surgical History:  Procedure Laterality Date  . ABDOMINAL HYSTERECTOMY  2004   partial; has cervix and ovaries per patient; done for fibroids, noncancerous  . ANTERIOR CRUCIATE LIGAMENT REPAIR Right 2000  . COLONOSCOPY WITH PROPOFOL N/A 04/28/2019   Procedure: COLONOSCOPY WITH PROPOFOL;  Surgeon: Lin Landsman, MD;  Location: [facility] ENDOSCOPY;  Service: Gastroenterology;  Laterality: N/A;  . ESOPHAGOGASTRODUODENOSCOPY (EGD) WITH PROPOFOL N/A 04/28/2019   Procedure: ESOPHAGOGASTRODUODENOSCOPY (EGD) WITH PROPOFOL;  Surgeon: Lin Landsman, MD;  Location: [facility] ENDOSCOPY;  Service: Gastroenterology;  Laterality: N/A;    SOCIAL HISTORY: Social History   Socioeconomic History  . Marital status: Single    Spouse name: Not on file  . Number of children: Not on file  . Years of education: Not on file  . Highest education level: Not on file  Occupational History  . Not on file  Tobacco Use  . Smoking status: Never Smoker  . Smokeless tobacco: Never Used  Vaping Use  . Vaping Use: Never used  Substance  and Sexual Activity  . Alcohol use: Yes    Alcohol/week: 0.0 standard drinks  . Drug use: No  . Sexual activity: Yes    Partners: Male    Birth control/protection: Surgical   Other Topics Concern  . 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      Diet- gym, knee limits running, paddleboating. Interval training 3x per week.       Diet-regular      Social Determinants of Health   Financial Resource Strain:   . Difficulty of Paying Living Expenses: Not on file  Food Insecurity:   . Worried About Charity fundraiser in the Last Year: Not on file  . Ran Out of Food in the Last Year: Not on file  Transportation Needs:   . Lack of Transportation (Medical): Not on file  . Lack of Transportation (Non-Medical): Not on file  Physical Activity:   . Days of Exercise per Week: Not on file  . Minutes of Exercise per Session: Not on file  Stress:   . Feeling of Stress : Not on file  Social Connections:   . Frequency of Communication with Friends and Family: Not on file  . Frequency of Social Gatherings with Friends and Family: Not on file  . Attends Religious Services: Not on file  . Active Member of Clubs or Organizations: Not on file  . Attends Archivist Meetings: Not on file  . Marital Status: Not on file  Intimate Partner Violence:   . Fear of Current or Ex-Partner: Not on file  . Emotionally Abused: Not on file  . Physically Abused: Not on file  . Sexually Abused: Not on file    FAMILY HISTORY: Family History  Problem Relation Age of Onset  . Alzheimer's disease Mother   . Asthma Mother   . Diabetes Mother   . Hyperlipidemia Mother   . Hypertension Mother   . Stroke Mother   . Heart disease Father        CHF  . Diabetes Father   . Hyperlipidemia Father   . Alcohol abuse Brother   . Heart disease Brother   . Asthma Sister   . Diabetes Sister   . Breast cancer Neg Hx     ALLERGIES:  is allergic to pantoprazole sodium, tetracyclines & related, breo ellipta [fluticasone furoate-vilanterol], and flagyl [metronidazole].  MEDICATIONS:   Current Outpatient Medications  Medication Sig Dispense Refill  . albuterol (VENTOLIN HFA) 108 (90 Base) MCG/ACT inhaler INHALE 2 PUFFS INTO THE LUNGS EVERY 4 (FOUR) HOURS AS NEEDED FOR WHEEZING OR SHORTNESS OF BREATH. 18 g 2  . beclomethasone (QVAR REDIHALER) 80 MCG/ACT inhaler Inhale 2 puffs into the lungs 2 (two) times daily. 10.6 g 6  . cetirizine HCl (ZYRTEC CHILDRENS ALLERGY) 5 MG/5ML SOLN Take 2.5 mg by mouth daily.    . famotidine (PEPCID) 20 MG tablet Take 1 tablet (20 mg total) by mouth daily. 30 tablet 2  . phenazopyridine (PYRIDIUM) 200 MG tablet Take 1 tablet (200 mg total) by mouth 3 (three) times daily as needed for pain. 10 tablet 0  . Probiotic Product (SOLUBLE FIBER/PROBIOTICS PO) Take by mouth.    . dicyclomine (BENTYL) 10 MG capsule Take 1 capsule (10 mg total) by mouth 4 (four) times daily -  before meals and at bedtime. 30 capsule 2  . ipratropium-albuterol (  DUONEB) 0.5-2.5 (3) MG/3ML SOLN Take 3 mLs by nebulization every 8 (eight) hours as needed. (Patient not taking: Reported on 11/01/2019) 360 mL 11  . Tiotropium Bromide Monohydrate (SPIRIVA RESPIMAT) 1.25 MCG/ACT AERS Inhale 2 puffs into the lungs daily. (Patient not taking: Reported on 05/03/2019) 4 g 6  . Tiotropium Bromide Monohydrate (SPIRIVA RESPIMAT) 1.25 MCG/ACT AERS Inhale 2 puffs into the lungs daily. (Patient not taking: Reported on 04/15/2019) 4 g 0   Current Facility-Administered Medications  Medication Dose Route Frequency Provider Last Rate Last Admin  . ipratropium-albuterol (DUONEB) 0.5-2.5 (3) MG/3ML nebulizer solution 3 mL  3 mL Nebulization Q6H Burnard Hawthorne, FNP         PHYSICAL EXAMINATION: ECOG PERFORMANCE STATUS: 1 - Symptomatic but completely ambulatory Vitals:   11/01/19 1321  BP: (!) 135/95  Pulse: 76  Resp: 18  Temp: 98.1 F (36.7 C)   Filed Weights   11/01/19 1321  Weight: 151 lb 3.2 oz (68.6 kg)    Physical Exam Constitutional:      General: She is not in acute  distress. HENT:     Head: Normocephalic and atraumatic.  Eyes:     General: No scleral icterus. Cardiovascular:     Rate and Rhythm: Normal rate and regular rhythm.     Heart sounds: Normal heart sounds.  Pulmonary:     Effort: Pulmonary effort is normal. No respiratory distress.     Breath sounds: No wheezing.  Abdominal:     General: Bowel sounds are normal. There is no distension.     Palpations: Abdomen is soft.  Musculoskeletal:        General: No deformity. Normal range of motion.     Cervical back: Normal range of motion and neck supple.  Skin:    General: Skin is warm and dry.     Findings: No erythema or rash.  Neurological:     Mental Status: She is alert and oriented to person, place, and time. Mental status is at baseline.     Cranial Nerves: No cranial nerve deficit.     Coordination: Coordination normal.  Psychiatric:        Mood and Affect: Mood normal.     RADIOGRAPHIC STUDIES: I have personally reviewed the radiological images as listed and agreed with the findings in the report. No results found.   LABORATORY DATA:  I have reviewed the data as listed Lab Results  Component Value Date   WBC 8.9 11/01/2019   HGB 14.9 11/01/2019   HCT 44.2 11/01/2019   MCV 89.1 11/01/2019   PLT 321 11/01/2019   Recent Labs    04/07/19 1243 04/07/19 1454  NA 139  --   K 3.8  --   CL 101  --   CO2 28  --   GLUCOSE 88  --   BUN 5*  --   CREATININE 0.82 0.70  CALCIUM 10.0  --   PROT 7.5  --   ALBUMIN 4.3  --   AST 26  --   ALT 24  --   ALKPHOS 79  --   BILITOT 0.6  --    Iron/TIBC/Ferritin/ %Sat No results found for: IRON, TIBC, FERRITIN, IRONPCTSAT      ASSESSMENT & PLAN:  1. Erythrocytosis    Her previous work-up is negative JAK2 V617F mutation negative, with reflex to other mutations CALR, MPL, JAK 2 Ex 12-15 mutations negative.  Erythrocytosis was considered most likely secondary to secondary etiology. Previous sleep apnea in  2019 showed no  significant obstruction or central sleep disordered breathing.  Lung study history of asthma, per pulmonology Dr. Patsey Berthold, not explaining erythrocytosis. My plan was to proceed with further testing for familial erythrocytosis.  Patient wants to defer and continue to have hemoglobin levels monitored. Today's labs were evaluated and discussed with patient.  Hemoglobin and hematocrit both remained stable and slightly decreased.  Since her cancer stable, I will continue observation at this point.   Orders Placed This Encounter  Procedures  . CBC with Differential/Platelet    Standing Status:   Future    Standing Expiration Date:   10/31/2020    We spent sufficient time to discuss many aspect of care, questions were answered to patient's satisfaction. The patient knows to call the clinic with any problems questions or concerns.  Cc Burnard Hawthorne, FNP  Return of visit: 6 months Thank you for this kind referral and the opportunity to participate in the care of this patient. A copy of today's note is routed to referring provider    Earlie Server, MD, PhD 11/01/2019

## 2019-11-01 NOTE — Progress Notes (Signed)
Patient denies new problems/concerns today.   °

## 2019-11-01 NOTE — Addendum Note (Signed)
Addended by: Earlie Server on: 11/01/2019 09:20 PM   Modules accepted: Level of Service

## 2019-11-10 ENCOUNTER — Encounter: Payer: Self-pay | Admitting: Family

## 2019-12-15 ENCOUNTER — Other Ambulatory Visit: Payer: Self-pay | Admitting: Family

## 2019-12-15 DIAGNOSIS — J4530 Mild persistent asthma, uncomplicated: Secondary | ICD-10-CM

## 2019-12-15 MED ORDER — QVAR REDIHALER 80 MCG/ACT IN AERB: 2.0000 | INHALATION_SPRAY | Freq: Two times a day (BID) | RESPIRATORY_TRACT | 6 refills | Status: DC

## 2019-12-16 ENCOUNTER — Other Ambulatory Visit: Payer: Self-pay

## 2019-12-28 ENCOUNTER — Encounter: Payer: Self-pay | Admitting: Family

## 2019-12-31 ENCOUNTER — Other Ambulatory Visit: Payer: Self-pay | Admitting: Family

## 2019-12-31 DIAGNOSIS — M25569 Pain in unspecified knee: Secondary | ICD-10-CM

## 2020-02-14 ENCOUNTER — Ambulatory Visit: Payer: 59 | Admitting: Orthopaedic Surgery

## 2020-02-16 ENCOUNTER — Ambulatory Visit (INDEPENDENT_AMBULATORY_CARE_PROVIDER_SITE_OTHER): Payer: Medicare Other | Admitting: Orthopaedic Surgery

## 2020-02-16 ENCOUNTER — Ambulatory Visit (INDEPENDENT_AMBULATORY_CARE_PROVIDER_SITE_OTHER): Payer: Medicare Other

## 2020-02-16 ENCOUNTER — Encounter: Payer: Self-pay | Admitting: Orthopaedic Surgery

## 2020-02-16 VITALS — Ht 65.5 in | Wt 150.0 lb

## 2020-02-16 DIAGNOSIS — M1711 Unilateral primary osteoarthritis, right knee: Secondary | ICD-10-CM

## 2020-02-16 DIAGNOSIS — M25561 Pain in right knee: Secondary | ICD-10-CM

## 2020-02-16 DIAGNOSIS — G8929 Other chronic pain: Secondary | ICD-10-CM

## 2020-02-16 NOTE — Progress Notes (Signed)
Office Visit Note   Patient: Lynn Peters           Date of Birth: 18-Mar-1955           MRN: [id number] Visit Date: 02/16/2020              Requested by: Burnard Hawthorne, FNP [address] [place],  [place] [address] PCP: Burnard Hawthorne, FNP   Assessment & Plan: Visit Diagnoses:  1. Chronic pain of right knee   2. Unilateral primary osteoarthritis, right knee     Plan: I did share with her a knee model and went over her x-rays in detail and explained the first opinion in terms of recommending a knee replacement surgery that I agreed with because this would be the next step for her given the arthritis that she is developed in that knee over time from having an ligamentous reconstruction with an injury and a medial meniscal injury as well.  I showed her a knee model and the knee replacement model.  I explained in detail what knee replacement surgery involves.  I talked about her interoperative and postoperative course as well as the risk and benefits of surgery.  She would like to talk this over with her husband.  I gave her our surgery scheduler's card and she will let us know if she would like to have this scheduled.  All questions and concerns were answered and addressed.  Follow-Up Instructions: Return for 2 weeks post-op.   Orders:  Orders Placed This Encounter  Procedures  . XR Knee 1-2 Views Right   No orders of the defined types were placed in this encounter.     Procedures: No procedures performed   Clinical Data: No additional findings.   Subjective: Chief Complaint  Patient presents with  . Right Knee - Pain  The patient is referred to me from her friend.  She has a history of a right knee ACL reconstruction done over 20 years ago.  She also had medial meniscal surgery at the same time.  Over time she has developed worsening right knee pain and instability.  She was seen by orthopedic surgery in [place] who recommended knee replacement.  She  decided to come to see me for this and talk about it further.  She does have a history of asthma which she takes medications for as well as his reflux.  She says her right knee does hurt on a daily basis and some days she has walk with a cane.  She says the knee does feel unstable and swells on occasion.  She has been dealing with this for well over 12 months now and it is getting worse.  HPI  Review of Systems She currently denies any headache, chest pain, shortness of breath, fever, chills, nausea, vomiting  Objective: Vital Signs: Ht 5' 5.5" (1.664 m)   Wt 150 lb (68 kg)   BMI 24.58 kg/m   Physical Exam She is alert and orient x3 and in no acute distress Ortho Exam Examination of her right knee shows no effusion but it is definitely ligamentously lax.  There is varus malalignment and significant medial joint line tenderness.  The range of motion is good but there is significant patellofemoral cavitation as well. Specialty Comments:  No specialty comments available.  Imaging: No results found. X-rays that accompany her shows varus malalignment of the right knee with significant patellofemoral arthritic changes and medial arthritic changes with near complete loss of medial joint  space.  There is interference screws from previous ACL reconstruction in both the tibia and the femur on the right side.  PMFS History: Patient Active Problem List   Diagnosis Date Noted  . Unilateral primary osteoarthritis, right knee 02/16/2020  . Abnormal CT scan, gastrointestinal tract   . Diarrhea   . Suprapubic pain 11/10/2018  . Disorder of ureter 11/10/2018  . Abdominal pain 10/23/2018  . Arthritis 09/16/2017  . Hyperlipidemia LDL goal <160 08/30/2017  . Apneic episode 08/29/2017  . Atypical chest pain 08/29/2017  . Acquired bilateral flat feet 08/20/2017  . Onychomycosis 08/20/2017  . Reflux esophagitis 02/16/2017  . Acute left-sided low back pain with left-sided sciatica 09/16/2016  .  Dysuria 08/15/2016  . Overweight (BMI 25.0-29.9) 08/15/2016  . Allergic rhinitis due to pollen 08/05/2016  . PVC (premature ventricular contraction) 03/29/2016  . Mild persistent asthma 10/04/2015  . Abnormal Pap smear of cervix 10/04/2015  . Colon cancer screening 02/28/2015  . Family history of diabetes mellitus 02/16/2015   Past Medical History:  Diagnosis Date  . Allergy   . Arthritis   . Asthma   . Chicken pox   . Diverticulitis 2009    Family History  Problem Relation Age of Onset  . Alzheimer's disease Mother   . Asthma Mother   . Diabetes Mother   . Hyperlipidemia Mother   . Hypertension Mother   . Stroke Mother   . Heart disease Father        CHF  . Diabetes Father   . Hyperlipidemia Father   . Alcohol abuse Brother   . Heart disease Brother   . Asthma Sister   . Diabetes Sister   . Breast cancer Neg Hx     Past Surgical History:  Procedure Laterality Date  . ABDOMINAL HYSTERECTOMY  2004   partial; has cervix and ovaries per patient; done for fibroids, noncancerous  . ANTERIOR CRUCIATE LIGAMENT REPAIR Right 2000  . COLONOSCOPY WITH PROPOFOL N/A 04/28/2019   Procedure: COLONOSCOPY WITH PROPOFOL;  Surgeon: Toney Reil, MD;  Location: [facility] ENDOSCOPY;  Service: Gastroenterology;  Laterality: N/A;  . ESOPHAGOGASTRODUODENOSCOPY (EGD) WITH PROPOFOL N/A 04/28/2019   Procedure: ESOPHAGOGASTRODUODENOSCOPY (EGD) WITH PROPOFOL;  Surgeon: Toney Reil, MD;  Location: [facility] ENDOSCOPY;  Service: Gastroenterology;  Laterality: N/A;   Social History   Occupational History  . Not on file  Tobacco Use  . Smoking status: Never Smoker  . Smokeless tobacco: Never Used  Vaping Use  . Vaping Use: Never used  Substance and Sexual Activity  . Alcohol use: Yes    Alcohol/week: 0.0 standard drinks  . Drug use: No  . Sexual activity: Yes    Partners: Male    Birth control/protection: Surgical

## 2020-03-03 ENCOUNTER — Encounter: Payer: Self-pay | Admitting: Orthopaedic Surgery

## 2020-03-20 ENCOUNTER — Other Ambulatory Visit: Payer: Self-pay

## 2020-03-20 ENCOUNTER — Ambulatory Visit (INDEPENDENT_AMBULATORY_CARE_PROVIDER_SITE_OTHER): Payer: Medicare Other

## 2020-03-20 DIAGNOSIS — Z23 Encounter for immunization: Secondary | ICD-10-CM

## 2020-03-22 ENCOUNTER — Encounter: Payer: Self-pay | Admitting: Family

## 2020-03-22 DIAGNOSIS — J4530 Mild persistent asthma, uncomplicated: Secondary | ICD-10-CM

## 2020-03-22 MED ORDER — ALBUTEROL SULFATE HFA 108 (90 BASE) MCG/ACT IN AERS: INHALATION_SPRAY | RESPIRATORY_TRACT | 2 refills | Status: DC

## 2020-04-19 ENCOUNTER — Other Ambulatory Visit: Payer: Self-pay

## 2020-04-26 ENCOUNTER — Encounter: Payer: Self-pay | Admitting: Orthopaedic Surgery

## 2020-05-01 ENCOUNTER — Inpatient Hospital Stay: Payer: Medicare Other | Attending: Oncology

## 2020-05-01 ENCOUNTER — Inpatient Hospital Stay (HOSPITAL_BASED_OUTPATIENT_CLINIC_OR_DEPARTMENT_OTHER): Payer: Medicare Other | Admitting: Oncology

## 2020-05-01 ENCOUNTER — Other Ambulatory Visit: Payer: Self-pay

## 2020-05-01 VITALS — BP 130/86 | HR 70 | Temp 97.3°F | Resp 20 | Wt 150.0 lb

## 2020-05-01 DIAGNOSIS — L680 Hirsutism: Secondary | ICD-10-CM | POA: Diagnosis not present

## 2020-05-01 DIAGNOSIS — E282 Polycystic ovarian syndrome: Secondary | ICD-10-CM | POA: Insufficient documentation

## 2020-05-01 DIAGNOSIS — D751 Secondary polycythemia: Secondary | ICD-10-CM | POA: Insufficient documentation

## 2020-05-01 LAB — CBC WITH DIFFERENTIAL/PLATELET
Abs Immature Granulocytes: 0.04 10*3/uL (ref 0.00–0.07)
Basophils Absolute: 0.1 10*3/uL (ref 0.0–0.1)
Basophils Relative: 1 %
Eosinophils Absolute: 0.1 10*3/uL (ref 0.0–0.5)
Eosinophils Relative: 1 %
HCT: 47 % — ABNORMAL HIGH (ref 36.0–46.0)
Hemoglobin: 15.4 g/dL — ABNORMAL HIGH (ref 12.0–15.0)
Immature Granulocytes: 0 %
Lymphocytes Relative: 25 %
Lymphs Abs: 2.3 10*3/uL (ref 0.7–4.0)
MCH: 29.7 pg (ref 26.0–34.0)
MCHC: 32.8 g/dL (ref 30.0–36.0)
MCV: 90.6 fL (ref 80.0–100.0)
Monocytes Absolute: 0.6 10*3/uL (ref 0.1–1.0)
Monocytes Relative: 7 %
Neutro Abs: 6.2 10*3/uL (ref 1.7–7.7)
Neutrophils Relative %: 66 %
Platelets: 363 10*3/uL (ref 150–400)
RBC: 5.19 MIL/uL — ABNORMAL HIGH (ref 3.87–5.11)
RDW: 12.9 % (ref 11.5–15.5)
WBC: 9.4 10*3/uL (ref 4.0–10.5)
nRBC: 0 % (ref 0.0–0.2)

## 2020-05-01 NOTE — Progress Notes (Signed)
Hematology/Oncology follow up note [facility] [phone] [phone]   Patient Care Team: Burnard Hawthorne, FNP as PCP - General (Family Medicine)  REFERRING PROVIDER: Burnard Hawthorne, FNP  CHIEF COMPLAINTS/REASON FOR VISIT:  Follow up  polycytosis/erythrocytosis  HISTORY OF PRESENTING ILLNESS:  Lynn Peters is a 65 y.o. female who was seen in consultation at the request of Burnard Hawthorne, FNP for evaluation of polycytosis/erythrocytosis Reviewed patient's recent lab work which was obtain by PCP.  04/07/2019 labs showed elevated hemoglobin at 15.7,  total white count 7.6, platelet counts 354,000. Erythrocytosis, ischronic Onset, duration since at least 2017 at that time with a hemoglobin of 16. No aggravating or alleviating factors.   Associated signs or symptoms: Denies weight loss, fever, chills, fatigue, night sweats.   Context:  Smoking history: Never smoker.  She denies alcohol use.  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 History of blood clots: Denies Daytime somnolence: Denies Family history of polycythemia: Denies  Reports chronic abdominal pain.  Sometimes she has nonbloody diarrhea.  Patient was recently seen by gastroenterology Dr. Marius Ditch.  GI recommends stool studies to rule out infection, perform H. pylori breath test, recommend EGD and colonoscopy for further evaluation. Patient is a vegan   INTERVAL HISTORY Lynn Peters is a 65 y.o. female who has above history reviewed by me today presents for follow up visit for erythrocytosis Problems and complaints are listed below: Patient reports feeling well.  No new complaints. She provides history of hirsutism.   Review of Systems  Constitutional: Negative for appetite change, chills, fatigue and fever.  HENT:   Negative for hearing loss and voice change.   Eyes: Negative for eye problems.  Respiratory: Negative for chest tightness and cough.    Cardiovascular: Negative for chest pain.  Gastrointestinal: Negative for abdominal distention, abdominal pain and blood in stool.  Endocrine: Negative for hot flashes.  Genitourinary: Negative for difficulty urinating and frequency.   Musculoskeletal: Negative for arthralgias.  Skin: Negative for itching and rash.  Neurological: Negative for extremity weakness.  Hematological: Negative for adenopathy.  Psychiatric/Behavioral: Negative for confusion.    MEDICAL HISTORY:  Past Medical History:  Diagnosis Date  . Allergy   . Arthritis   . Asthma   . Chicken pox   . Diverticulitis 2009    SURGICAL HISTORY: Past Surgical History:  Procedure Laterality Date  . ABDOMINAL HYSTERECTOMY  2004   partial; has cervix and ovaries per patient; done for fibroids, noncancerous  . ANTERIOR CRUCIATE LIGAMENT REPAIR Right 2000  . COLONOSCOPY WITH PROPOFOL N/A 04/28/2019   Procedure: COLONOSCOPY WITH PROPOFOL;  Surgeon: Lin Landsman, MD;  Location[facility] ENDOSCOPY;  Service: Gastroenterology;  Laterality: N/A;  . ESOPHAGOGASTRODUODENOSCOPY (EGD) WITH PROPOFOL N/A 04/28/2019   Procedure: ESOPHAGOGASTRODUODENOSCOPY (EGD) WITH PROPOFOL;  Surgeon: Lin Landsman, MD;  Location: [facility] ENDOSCOPY;  Service: Gastroenterology;  Laterality: N/A;    SOCIAL HISTORY: Social History   Socioeconomic History  . Marital status: Single    Spouse name: Not on file  . Number of children: Not on file  . Years of education: Not on file  . Highest education level: Not on file  Occupational History  . Not on file  Tobacco Use  . Smoking status: Never Smoker  . Smokeless tobacco: Never Used  Vaping Use  . Vaping Use: Never used  Substance and Sexual Activity  . Alcohol use: Yes    Alcohol/week: 0.0 standard drinks  .  Drug use: No  . Sexual activity: Yes    Partners: Male    Birth control/protection: Surgical  Other Topics Concern  . Not on file  Social History Narrative   Moved from [place] here 3  years ago. 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      Diet- gym, knee limits running, paddleboating. Interval training 3x per week.       Diet-regular      Social Determinants of Radio broadcast assistant Strain: Not on file  Food Insecurity: Not on file  Transportation Needs: Not on file  Physical Activity: Not on file  Stress: Not on file  Social Connections: Not on file  Intimate Partner Violence: Not on file    FAMILY HISTORY: Family History  Problem Relation Age of Onset  . Alzheimer's disease Mother   . Asthma Mother   . Diabetes Mother   . Hyperlipidemia Mother   . Hypertension Mother   . Stroke Mother   . Heart disease Father        CHF  . Diabetes Father   . Hyperlipidemia Father   . Alcohol abuse Brother   . Heart disease Brother   . Asthma Sister   . Diabetes Sister   . Breast cancer Neg Hx     ALLERGIES:  is allergic to pantoprazole sodium, tetracyclines & related, breo ellipta [fluticasone furoate-vilanterol], and flagyl [metronidazole].  MEDICATIONS:  Current Outpatient Medications  Medication Sig Dispense Refill  . albuterol (VENTOLIN HFA) 108 (90 Base) MCG/ACT inhaler INHALE 2 PUFFS INTO THE LUNGS EVERY 4 (FOUR) HOURS AS NEEDED FOR WHEEZING OR SHORTNESS OF BREATH. 18 g 2  . beclomethasone (QVAR REDIHALER) 80 MCG/ACT inhaler Inhale 2 puffs into the lungs 2 (two) times daily. 10.6 g 6  . cetirizine HCl (ZYRTEC CHILDRENS ALLERGY) 5 MG/5ML SOLN Take 2.5 mg by mouth daily.    Marland Kitchen dicyclomine (BENTYL) 10 MG capsule Take 1 capsule (10 mg total) by mouth 4 (four) times daily -  before meals and at bedtime. 30 capsule 2  . famotidine (PEPCID) 20 MG tablet Take 1 tablet (20 mg total) by mouth daily. 30 tablet 2  . ipratropium-albuterol (DUONEB) 0.5-2.5 (3) MG/3ML SOLN Take 3 mLs by nebulization every 8 (eight) hours as needed. (Patient not taking: Reported on 11/01/2019) 360 mL 11  . phenazopyridine (PYRIDIUM) 200 MG tablet Take 1 tablet  (200 mg total) by mouth 3 (three) times daily as needed for pain. 10 tablet 0  . Probiotic Product (SOLUBLE FIBER/PROBIOTICS PO) Take by mouth.    . Tiotropium Bromide Monohydrate (SPIRIVA RESPIMAT) 1.25 MCG/ACT AERS Inhale 2 puffs into the lungs daily. (Patient not taking: Reported on 05/03/2019) 4 g 6  . Tiotropium Bromide Monohydrate (SPIRIVA RESPIMAT) 1.25 MCG/ACT AERS Inhale 2 puffs into the lungs daily. (Patient not taking: Reported on 04/15/2019) 4 g 0   Current Facility-Administered Medications  Medication Dose Route Frequency Provider Last Rate Last Admin  . ipratropium-albuterol (DUONEB) 0.5-2.5 (3) MG/3ML nebulizer solution 3 mL  3 mL Nebulization Q6H Burnard Hawthorne, FNP         PHYSICAL EXAMINATION: ECOG PERFORMANCE STATUS: 1 - Symptomatic but completely ambulatory Vitals:   05/01/20 1321  BP: 130/86  Pulse: 70  Resp: 20  Temp: (!) 97.3 F (36.3 C)   Filed Weights   05/01/20 1321  Weight: 150 lb (68 kg)    Physical Exam Constitutional:      General: She is  not in acute distress. HENT:     Head: Normocephalic and atraumatic.  Eyes:     General: No scleral icterus. Cardiovascular:     Rate and Rhythm: Normal rate and regular rhythm.     Heart sounds: Normal heart sounds.  Pulmonary:     Effort: Pulmonary effort is normal. No respiratory distress.     Breath sounds: No wheezing.  Abdominal:     General: Bowel sounds are normal. There is no distension.     Palpations: Abdomen is soft.  Musculoskeletal:        General: No deformity. Normal range of motion.     Cervical back: Normal range of motion and neck supple.  Skin:    General: Skin is warm and dry.     Findings: No erythema or rash.  Neurological:     Mental Status: She is alert and oriented to person, place, and time. Mental status is at baseline.     Cranial Nerves: No cranial nerve deficit.     Coordination: Coordination normal.  Psychiatric:        Mood and Affect: Mood normal.      RADIOGRAPHIC STUDIES: I have personally reviewed the radiological images as listed and agreed with the findings in the report. No results found.   LABORATORY DATA:  I have reviewed the data as listed Lab Results  Component Value Date   WBC 9.4 05/01/2020   HGB 15.4 (H) 05/01/2020   HCT 47.0 (H) 05/01/2020   MCV 90.6 05/01/2020   PLT 363 05/01/2020   No results for input(s): NA, K, CL, CO2, GLUCOSE, BUN, CREATININE, CALCIUM, GFRNONAA, GFRAA, PROT, ALBUMIN, AST, ALT, ALKPHOS, BILITOT, BILIDIR, IBILI in the last 8760 hours. Iron/TIBC/Ferritin/ %Sat No results found for: IRON, TIBC, FERRITIN, IRONPCTSAT      ASSESSMENT & PLAN:  1. Erythrocytosis   2. Hirsutism    Her previous work-up is negative JAK2 V617F mutation negative, with reflex to other mutations CALR, MPL, JAK 2 Ex 12-15 mutations negative.  Erythrocytosis was considered most likely secondary to secondary etiology. Previous sleep apnea in 2019 showed no significant obstruction or central sleep disordered breathing.  Lung study history of asthma, per pulmonology Dr. Patsey Berthold, not explaining erythrocytosis. Discussed about familial erythrocytosis work-up which will be sent out testing to [facility].  Patient wants to know if her insurance will be covering this. [Hereditary Erythrocytosis Mutations, Whole Blood- Test ID HEMP performed at [facility] clinic laboratory]  Hirsutism, is it related to erythrocytosis? Recommend patient to further discuss with primary care provider for further work-up, including testosterone levels, ?PCOS Labs reviewed and discussed with patient.  Hematocrit is less than 50.  No need for phlebotomy at this point.  Orders Placed This Encounter  Procedures  . CBC with Differential/Platelet    Standing Status:   Future    Standing Expiration Date:   05/01/2021    We spent sufficient time to discuss many aspect of care, questions were answered to patient's satisfaction. The patient knows to call the  clinic with any problems questions or concerns.  Cc Burnard Hawthorne, FNP  Return of visit: 6 months Thank you for this kind referral and the opportunity to participate in the care of this patient. A copy of today's note is routed to referring provider    Earlie Server, MD, PhD 05/01/2020

## 2020-05-02 ENCOUNTER — Telehealth: Payer: Self-pay

## 2020-05-02 ENCOUNTER — Other Ambulatory Visit: Payer: Self-pay

## 2020-05-02 DIAGNOSIS — Z13820 Encounter for screening for osteoporosis: Secondary | ICD-10-CM

## 2020-05-02 DIAGNOSIS — Z1231 Encounter for screening mammogram for malignant neoplasm of breast: Secondary | ICD-10-CM

## 2020-05-02 NOTE — Telephone Encounter (Signed)
Patient notified via My Chart

## 2020-05-02 NOTE — Telephone Encounter (Signed)
Patient responded via MyChart message and she does not want to have this test drawn at this time.

## 2020-05-02 NOTE — Telephone Encounter (Signed)
-----   Message from Earlie Server, MD sent at 05/01/2020 10:03 PM EDT ----- Please provide the send out test detail to her. Hereditary Erythrocytosis Mutations, Whole Blood- Test ID HEMP performed at [facility] clinic laboratory Please double check with Judeen Hammans that how do we order here.  I believe Dr. Janese Banks has ordered this before. Patient plans to call her insurance and check if this lab will be covered.

## 2020-05-03 ENCOUNTER — Encounter: Payer: Self-pay | Admitting: Family

## 2020-05-03 ENCOUNTER — Other Ambulatory Visit: Payer: Self-pay

## 2020-05-03 ENCOUNTER — Ambulatory Visit (INDEPENDENT_AMBULATORY_CARE_PROVIDER_SITE_OTHER): Payer: Medicare Other | Admitting: Family

## 2020-05-03 VITALS — BP 118/86 | HR 87 | Temp 97.6°F | Ht 65.0 in | Wt 150.0 lb

## 2020-05-03 DIAGNOSIS — Z13220 Encounter for screening for lipoid disorders: Secondary | ICD-10-CM | POA: Diagnosis not present

## 2020-05-03 DIAGNOSIS — Z136 Encounter for screening for cardiovascular disorders: Secondary | ICD-10-CM

## 2020-05-03 DIAGNOSIS — Z13820 Encounter for screening for osteoporosis: Secondary | ICD-10-CM

## 2020-05-03 DIAGNOSIS — R5383 Other fatigue: Secondary | ICD-10-CM | POA: Diagnosis not present

## 2020-05-03 DIAGNOSIS — Z1231 Encounter for screening mammogram for malignant neoplasm of breast: Secondary | ICD-10-CM

## 2020-05-03 DIAGNOSIS — Z23 Encounter for immunization: Secondary | ICD-10-CM

## 2020-05-03 DIAGNOSIS — Z0000 Encounter for general adult medical examination without abnormal findings: Secondary | ICD-10-CM | POA: Insufficient documentation

## 2020-05-03 DIAGNOSIS — Z780 Asymptomatic menopausal state: Secondary | ICD-10-CM

## 2020-05-03 DIAGNOSIS — D751 Secondary polycythemia: Secondary | ICD-10-CM

## 2020-05-03 NOTE — Patient Instructions (Signed)
Please call  and schedule your 3D mammogram, bone density scan as discussed.   [place]  [place], [place]  Please have your Tdap vaccine ( tetanus) at the local pharmacy

## 2020-05-03 NOTE — Progress Notes (Signed)
Subjective:    Patient ID: Lynn Peters, female    DOB: [birth year], 65 y.o.   MRN: [id number]  CC: Lynn Peters is a 65 y.o. female who presents today for Welcome to medicare  HPI:  Patient here today for welcome to medicare within first year of medicare.   Complains of facial hair over face and chin. This has become more noticeable.   She also complains of fatigue which started  2 months ago. Endorses stress regarding global events. Sleep is restorative and she is sleeping well.   Due to dexa; mammogram is overdue  Pap smear 10/23/2018, negative malignancy, HPV. Due 2023  Colonoscopy UTD . Due 2028  Medications reviewed, patient not on opioids.  She has never been a smoker.   No alcohol use, drug use.   Hematology: Dr Tasia Catchings Pulmonology:  No longer following with pulmonology. Orthopedic: Dr Ninfa Linden Gastroenterology: Dr Marius Ditch Optometrist: doesn't recall last eye exam . Glasses are 65 years old. No vision changes.   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   Physcial activity : she walks daily 1-2 miles per day. No cp, sob.  Fell 3 months ago direct blow to right knee when she was walking on uneven parking lot. Denies frequent falls.   She denies changes to hearing or decreased hearing. No h/o hearing impairment  Home safety: She feels safe at home. She has supportive partner.   She has had covid vaccines and will call us with dates. Due for Tdap.   She denies functional limitations.    Advanced Directives: Completed. Partner is her Medical POA. She will bring to office so we may scan into her chart.   Dietary issues and exercise activities discussed: She is vegan however she has recently started to incorporate more diary and eggs. She eats fish. She also does intermittent fasting to maintain weight.   Cognitive Function: She denies problems with memory. She hasnt forgotten names , nor gotten lost in familiar places. Her mother had vascular dementia.   Consult with  Dr Tasia Catchings 05/01/20.  Her previous work-up is negative [id number] mutation negative,with reflex to other mutationsCALR, MPL, JAK 2 Ex 12-68mutations negative.  Erythrocytosis was considered most likely secondary to secondary etiology( ? Hirsutism) No h/o sleep apnea. Per Patsey Berthold, asthma doesn't explain  HISTORY:  Past Medical History:  Diagnosis Date  . Allergy   . Arthritis   . Asthma   . Chicken pox   . Diverticulitis 2009   Past Surgical History:  Procedure Laterality Date  . ABDOMINAL HYSTERECTOMY  2004   partial; has cervix and ovaries per patient; done for fibroids, noncancerous  . ANTERIOR CRUCIATE LIGAMENT REPAIR Right 2000  . COLONOSCOPY WITH PROPOFOL N/A 04/28/2019   Procedure: COLONOSCOPY WITH PROPOFOL;  Surgeon: Lin Landsman, MD;  Location: [facility] ENDOSCOPY;  Service: Gastroenterology;  Laterality: N/A;  . ESOPHAGOGASTRODUODENOSCOPY (EGD) WITH PROPOFOL N/A 04/28/2019   Procedure: ESOPHAGOGASTRODUODENOSCOPY (EGD) WITH PROPOFOL;  Surgeon: Lin Landsman, MD;  Location: [facility] ENDOSCOPY;  Service: Gastroenterology;  Laterality: N/A;   Family History  Problem Relation Age of Onset  . Alzheimer's disease Mother   . Asthma Mother   . Diabetes Mother   . Hyperlipidemia Mother   . Hypertension Mother   . Stroke Mother   . Heart disease Father        CHF  . Diabetes Father   . Hyperlipidemia Father   . Alcohol abuse Brother   . Heart disease Brother   . Asthma  Sister   . Diabetes Sister   . Breast cancer Neg Hx     Allergies: Pantoprazole sodium, Tetracyclines & related, Breo ellipta [fluticasone furoate-vilanterol], and Flagyl [metronidazole] Current Outpatient Medications on File Prior to Visit  Medication Sig Dispense Refill  . albuterol (VENTOLIN HFA) 108 (90 Base) MCG/ACT inhaler INHALE 2 PUFFS INTO THE LUNGS EVERY 4 (FOUR) HOURS AS NEEDED FOR WHEEZING OR SHORTNESS OF BREATH. 18 g 2  . beclomethasone (QVAR REDIHALER) 80 MCG/ACT inhaler Inhale 2 puffs into the  lungs 2 (two) times daily. 10.6 g 6  . cetirizine HCl (ZYRTEC) 5 MG/5ML SOLN Take 2.5 mg by mouth daily.    . famotidine (PEPCID) 20 MG tablet Take 1 tablet (20 mg total) by mouth daily. 30 tablet 2  . ipratropium-albuterol (DUONEB) 0.5-2.5 (3) MG/3ML SOLN Take 3 mLs by nebulization every 8 (eight) hours as needed. 360 mL 11  . phenazopyridine (PYRIDIUM) 200 MG tablet Take 1 tablet (200 mg total) by mouth 3 (three) times daily as needed for pain. 10 tablet 0  . Probiotic Product (SOLUBLE FIBER/PROBIOTICS PO) Take by mouth.     Current Facility-Administered Medications on File Prior to Visit  Medication Dose Route Frequency Provider Last Rate Last Admin  . ipratropium-albuterol (DUONEB) 0.5-2.5 (3) MG/3ML nebulizer solution 3 mL  3 mL Nebulization Q6H Karletta Millay, Yvetta Coder, FNP        Social History   Tobacco Use  . Smoking status: Never Smoker  . Smokeless tobacco: Never Used  Vaping Use  . Vaping Use: Never used  Substance Use Topics  . Alcohol use: Yes    Alcohol/week: 0.0 standard drinks  . Drug use: No    Review of Systems  Constitutional: Positive for fatigue. Negative for chills, fever and unexpected weight change.  HENT: Negative for congestion.   Respiratory: Negative for cough.   Cardiovascular: Negative for chest pain, palpitations and leg swelling.  Gastrointestinal: Negative for nausea and vomiting.  Musculoskeletal: Negative for arthralgias and myalgias.  Skin: Negative for rash.  Neurological: Negative for headaches.  Hematological: Negative for adenopathy.  Psychiatric/Behavioral: Negative for confusion, sleep disturbance and suicidal ideas. The patient is not nervous/anxious.       Objective:    BP 118/86   Pulse 87   Temp 97.6 F (36.4 C)   Ht 5\' 5"  (1.651 m)   Wt 150 lb (68 kg)   SpO2 98%   BMI 24.96 kg/m  BP Readings from Last 3 Encounters:  05/03/20 118/86  05/01/20 130/86  11/01/19 (!) 135/95   Wt Readings from Last 3 Encounters:  05/03/20 150  lb (68 kg)  05/01/20 150 lb (68 kg)  02/16/20 150 lb (68 kg)   Depression screen [facility] 2/9 05/03/2020 02/14/2017 01/08/2016  Decreased Interest 1 1 0  Down, Depressed, Hopeless 1 1 0  PHQ - 2 Score 2 2 0  Altered sleeping 0 0 -  Tired, decreased energy 2 1 -  Change in appetite 0 2 -  Feeling bad or failure about yourself  0 0 -  Trouble concentrating 0 0 -  Moving slowly or fidgety/restless 0 0 -  Suicidal thoughts 0 0 -  PHQ-9 Score 4 5 -  Difficult doing work/chores Somewhat difficult Somewhat difficult -    Physical Exam Vitals reviewed.  Constitutional:      Appearance: Normal appearance. She is well-developed.  Eyes:     Conjunctiva/sclera: Conjunctivae normal.  Neck:     Thyroid: No thyroid mass or thyromegaly.  Cardiovascular:  Rate and Rhythm: Normal rate and regular rhythm.     Pulses: Normal pulses.     Heart sounds: Normal heart sounds.  Pulmonary:     Effort: Pulmonary effort is normal.     Breath sounds: Normal breath sounds. No wheezing, rhonchi or rales.  Chest:  Breasts: Breasts are symmetrical.     Right: No inverted nipple, mass, nipple discharge, skin change or tenderness.     Left: No inverted nipple, mass, nipple discharge, skin change or tenderness.    Abdominal:     General: Bowel sounds are normal. There is no distension.     Palpations: Abdomen is soft. Abdomen is not rigid. There is no fluid wave or mass.     Tenderness: There is no abdominal tenderness. There is no guarding or rebound.  Lymphadenopathy:     Head:     Right side of head: No submental, submandibular, tonsillar, preauricular, posterior auricular or occipital adenopathy.     Left side of head: No submental, submandibular, tonsillar, preauricular, posterior auricular or occipital adenopathy.     Cervical: No cervical adenopathy.     Right cervical: No superficial, deep or posterior cervical adenopathy.    Left cervical: No superficial, deep or posterior cervical adenopathy.   Skin:    General: Skin is warm and dry.  Neurological:     Mental Status: She is alert.  Psychiatric:        Speech: Speech normal.        Behavior: Behavior normal.        Thought Content: Thought content normal.        Assessment & Plan:   Problem List Items Addressed This Visit      Other   Erythrocytosis    Discussed further hormone testing ( testosterone) and advised endocrine consult. Patient will consider this and we agreed to discuss further at follow up.       Fatigue    Etiology nonspecific. Advised patient that we start with baseline labs and ensure that she is up to date regarding prevention ( mammogram). Sleep study in 2019 didn't reveal OSA. She is euthyroid, no anemia. She politely declined Vitamin D lab draw and we can consider this as follow up as well as b12 to look for nutritional deficiencies. Advised to return to follow up if symptom persists.        Welcome to Medicare preventive visit - Primary    CBE performed. Deferred pelvic exam in the absence of complaints and pap smear is UTD. EKG shows NSR. No acute ischemia. No significant changes when compared to prior ekg 08/27/2017. Advised to go to pharmacy for Tdap. She will let us know dates of Covid vaccinations and bring copy of Advanced directive to office. Ordered mammogram and DEXA; patient will call to schedule these. BMI < 25.  Encouraged to continue exercise program and eating varied, healthy diet as she is doing.       Relevant Orders   Comprehensive metabolic panel (Completed)   Lipid panel (Completed)   TSH (Completed)    Other Visit Diagnoses    Encounter for screening mammogram for malignant neoplasm of breast       Relevant Orders   MM 3D SCREEN BREAST BILATERAL   Screening for osteoporosis       Relevant Orders   DG Bone Density   Encounter for lipid screening for cardiovascular disease       Relevant Orders   Lipid panel (Completed)   Asymptomatic menopausal  state       Relevant  Orders   DG Bone Density   Need for pneumococcal vaccine       Relevant Orders   Pneumococcal conjugate vaccine 20-valent (Prevnar 20) (Completed)       I have discontinued Sapphire Ezzell's Spiriva Respimat, Spiriva Respimat, and dicyclomine. I am also having her maintain her Probiotic Product (SOLUBLE FIBER/PROBIOTICS PO), ipratropium-albuterol, famotidine, phenazopyridine, cetirizine HCl, Qvar RediHaler, and albuterol. We will continue to administer ipratropium-albuterol.   No orders of the defined types were placed in this encounter.   Return precautions given.   Risks, benefits, and alternatives of the medications and treatment plan prescribed today were discussed, and patient expressed understanding.   Education regarding symptom management and diagnosis given to patient on AVS.  Continue to follow with Burnard Hawthorne, FNP for routine health maintenance.   Lavena Bullion and I agreed with plan.   Mable Paris, FNP

## 2020-05-03 NOTE — Assessment & Plan Note (Addendum)
CBE performed. Deferred pelvic exam in the absence of complaints and pap smear is UTD. EKG shows NSR. No acute ischemia. No significant changes when compared to prior ekg 08/27/2017. Advised to go to pharmacy for Tdap. She will let us know dates of Covid vaccinations and bring copy of Advanced directive to office. Ordered mammogram and DEXA; patient will call to schedule these. BMI < 25.  Encouraged to continue exercise program and eating varied, healthy diet as she is doing.

## 2020-05-04 LAB — COMPREHENSIVE METABOLIC PANEL
ALT: 16 U/L (ref 0–35)
AST: 20 U/L (ref 0–37)
Albumin: 4.6 g/dL (ref 3.5–5.2)
Alkaline Phosphatase: 69 U/L (ref 39–117)
BUN: 6 mg/dL (ref 6–23)
CO2: 28 mEq/L (ref 19–32)
Calcium: 9.7 mg/dL (ref 8.4–10.5)
Chloride: 103 mEq/L (ref 96–112)
Creatinine, Ser: 0.71 mg/dL (ref 0.40–1.20)
GFR: 89.39 mL/min (ref >60.00)
Glucose, Bld: 90 mg/dL (ref 70–99)
Potassium: 4.1 mEq/L (ref 3.5–5.1)
Sodium: 142 mEq/L (ref 135–145)
Total Bilirubin: 0.6 mg/dL (ref 0.2–1.2)
Total Protein: 7.6 g/dL (ref 6.0–8.3)

## 2020-05-04 LAB — LIPID PANEL
Cholesterol: 239 mg/dL — ABNORMAL HIGH (ref 0–200)
HDL: 62.9 mg/dL (ref >39.00)
LDL Cholesterol: 154 mg/dL — ABNORMAL HIGH (ref 0–99)
NonHDL: 175.64
Total CHOL/HDL Ratio: 4
Triglycerides: 107 mg/dL (ref 0.0–149.0)
VLDL: 21.4 mg/dL (ref 0.0–40.0)

## 2020-05-04 LAB — TSH: TSH: 2.42 u[IU]/mL (ref 0.35–4.50)

## 2020-05-07 DIAGNOSIS — R5383 Other fatigue: Secondary | ICD-10-CM | POA: Insufficient documentation

## 2020-05-07 DIAGNOSIS — D751 Secondary polycythemia: Secondary | ICD-10-CM | POA: Insufficient documentation

## 2020-05-07 NOTE — Assessment & Plan Note (Signed)
Discussed further hormone testing ( testosterone) and advised endocrine consult. Patient will consider this and we agreed to discuss further at follow up.

## 2020-05-07 NOTE — Assessment & Plan Note (Addendum)
Etiology nonspecific. Advised patient that we start with baseline labs and ensure that she is up to date regarding prevention ( mammogram). Sleep study in 2019 didn't reveal OSA. She is euthyroid, no anemia. She politely declined Vitamin D lab draw and we can consider this as follow up as well as b12 to look for nutritional deficiencies. Advised to return to follow up if symptom persists.  Note:   there was an error on ordering labs, primary dx for CMET and TSH should be fatigue.

## 2020-05-08 ENCOUNTER — Encounter: Payer: Self-pay | Admitting: Family

## 2020-05-09 ENCOUNTER — Other Ambulatory Visit: Payer: Self-pay | Admitting: Family

## 2020-05-09 DIAGNOSIS — D751 Secondary polycythemia: Secondary | ICD-10-CM

## 2020-05-12 ENCOUNTER — Encounter: Payer: Self-pay | Admitting: Oncology

## 2020-05-22 NOTE — Progress Notes (Addendum)
COVID Vaccine Completed:  x4 Date COVID Vaccine completed: Has received booster:  06-02-20 4th injection COVID vaccine manufacturer: Pfizer      Date of COVID positive in last 90 days:  N/A  PCP - Mable Paris, FNP Cardiologist - Dorothea Ogle, MD (notes Care Everywhere)  Chest x-ray - N/A EKG - 05-24-20 Epic Stress Test - 10-02-17 Care Everywhere ECHO - 10-01-17 Care Everywhere Cardiac Cath -  Pacemaker/ICD device last checked: Spinal Cord Stimulator:  Sleep Study - 2019 Epic, neg sleep apnea CPAP - No  Fasting Blood Sugar - N/A Checks Blood Sugar _____ times a day  Blood Thinner Instructions:   Aspirin Instructions:  ASA 81 mg  Last Dose:  Two days ago  Activity level:  Can go up a flight of stairs and perform activities of daily living without stopping and without symptoms of chest pain or shortness of breath.   Able to exercise without symptoms.   Anesthesia review: Hx of chest pain and PVCs Asthma.  Patient stated during PAT that she had episodes of chest pain with radiation to left arm last week.  EKG was repeated and showed no changes.  Patient was evaluated by Konrad Felix, PA-C, and upon further discussion pain was associated with movement of the left arm and patient has had some shoulder discomfort.  There was no shortness of breath.    Patient denies shortness of breath, fever, cough and chest pain at PAT appointment   Patient verbalized understanding of instructions that were given to them at the PAT appointment. Patient was also instructed that they will need to review over the PAT instructions again at home before surgery.

## 2020-05-22 NOTE — Patient Instructions (Addendum)
DUE TO COVID-19 ONLY ONE VISITOR IS ALLOWED TO COME WITH YOU AND STAY IN THE WAITING ROOM ONLY DURING PRE OP AND PROCEDURE.   **NO VISITORS ARE ALLOWED IN THE SHORT STAY AREA OR RECOVERY ROOM!!**  IF YOU WILL BE ADMITTED INTO THE HOSPITAL YOU ARE ALLOWED ONLY TWO SUPPORT PEOPLE DURING VISITATION HOURS ONLY (10AM -8PM)   . The support person(s) may change daily. . The support person(s) must pass our screening, gel in and out, and wear a mask at all times, including in the patient's room. . Patients must also wear a mask when staff or their support person are in the room.  No visitors under the age of 56. Any visitor under the age of 68 must be accompanied by an adult.    COVID SWAB TESTING MUST BE COMPLETED ON:  Wednesday, 05-31-20 @ 1:55 PM  [name], [place].  [facility] [address] Preadmit Testing        Your procedure is scheduled on: Friday, 06-02-20   Report to [facility] Main  Entrance   Report to Short Stay at 5:15 AM   [facility])   Call this number if you have problems the morning of surgery [phone]   Do not eat food :After Midnight.   May have liquids until 4:15 AM day of surgery  CLEAR LIQUID DIET  Foods Allowed                                                                     Foods Excluded  Water, Black Coffee and tea, regular and decaf              liquids that you cannot  Plain Jell-O in any flavor  (No red)                                     see through such as: Fruit ices (not with fruit pulp)                                      milk, soups, orange juice              Iced Popsicles (No red)                                      All solid food                                   Apple juices Sports drinks like Gatorade (No red) Lightly seasoned clear broth or consume(fat free) Sugar, honey syrup     Complete one Ensure drink the morning of surgery at 4:15 AM the day of surgery.      1. The day of  surgery:  ? Drink ONE (1) Pre-Surgery Clear Ensure or G2 by am the morning of surgery. Drink in one sitting. Do not sip.  ? This drink was given  to you during your hospital  pre-op appointment visit. ? Nothing else to drink after completing the  Pre-Surgery Clear Ensure or G2.          If you have questions, please contact your surgeon's office.     Oral Hygiene is also important to reduce your risk of infection.                                    Remember - BRUSH YOUR TEETH THE MORNING OF SURGERY WITH YOUR REGULAR TOOTHPASTE   Do NOT smoke after Midnight   Take these medicines the morning of surgery with A SIP OF WATER: Famotidine, Okay to use inhalers and bring with you day of surgery                               You may not have any metal on your body including hair pins, jewelry, and body piercings             Do not wear make-up, lotions, powders, perfumes/cologne, or deodorant             Do not wear nail polish.  Do not shave  48 hours prior to surgery.    Do not bring valuables to the hospital. [name].   Contacts, dentures or bridgework may not be worn into surgery.   Bring small overnight bag day of surgery.    Special Instructions: Bring a copy of your healthcare power of attorney and living will documents         the day of surgery if you haven't  scanned them in before.              Please read over the following fact sheets you were given: IF YOU HAVE QUESTIONS ABOUT YOUR PRE OP INSTRUCTIONS PLEASE CALL  [place] - Preparing for Surgery Before surgery, you can play an important role.  Because skin is not sterile, your skin needs to be as free of germs as possible.  You can reduce the number of germs on your skin by washing with CHG (chlorahexidine gluconate) soap before surgery.  CHG is an antiseptic cleaner which kills germs and bonds with the skin to continue killing germs even after  washing. Please DO NOT use if you have an allergy to CHG or antibacterial soaps.  If your skin becomes reddened/irritated stop using the CHG and inform your nurse when you arrive at Short Stay. Do not shave (including legs and underarms) for at least 48 hours prior to the first CHG shower.  You may shave your face/neck.  Please follow these instructions carefully:  1.  Shower with CHG Soap the night before surgery and the  morning of surgery.  2.  If you choose to wash your hair, wash your hair first as usual with your normal  shampoo.  3.  After you shampoo, rinse your hair and body thoroughly to remove the shampoo.                             4.  Use CHG as you would any other liquid soap.  You can apply chg directly to the skin and  wash.  Gently with a scrungie or clean washcloth.  5.  Apply the CHG Soap to your body ONLY FROM THE NECK DOWN.   Do   not use on face/ open                           Wound or open sores. Avoid contact with eyes, ears mouth and   genitals (private parts).                       Wash face,  Genitals (private parts) with your normal soap.             6.  Wash thoroughly, paying special attention to the area where your    surgery  will be performed.  7.  Thoroughly rinse your body with warm water from the neck down.  8.  DO NOT shower/wash with your normal soap after using and rinsing off the CHG Soap.                9.  Pat yourself dry with a clean towel.            10.  Wear clean pajamas.            11.  Place clean sheets on your bed the night of your first shower and do not  sleep with pets. Day of Surgery : Do not apply any lotions/deodorants the morning of surgery.  Please wear clean clothes to the hospital/surgery center.  FAILURE TO FOLLOW THESE INSTRUCTIONS MAY RESULT IN THE CANCELLATION OF YOUR SURGERY  PATIENT SIGNATURE_________________________________  NURSE  SIGNATURE__________________________________  ________________________________________________________________________   Lynn Peters  An incentive spirometer is a tool that can help keep your lungs clear and active. This tool measures how well you are filling your lungs with each breath. Taking long deep breaths may help reverse or decrease the chance of developing breathing (pulmonary) problems (especially infection) following:  A long period of time when you are unable to move or be active. BEFORE THE PROCEDURE   If the spirometer includes an indicator to show your best effort, your nurse or respiratory therapist will set it to a desired goal.  If possible, sit up straight or lean slightly forward. Try not to slouch.  Hold the incentive spirometer in an upright position. INSTRUCTIONS FOR USE  1. Sit on the edge of your bed if possible, or sit up as far as you can in bed or on a chair. 2. Hold the incentive spirometer in an upright position. 3. Breathe out normally. 4. Place the mouthpiece in your mouth and seal your lips tightly around it. 5. Breathe in slowly and as deeply as possible, raising the piston or the ball toward the top of the column. 6. Hold your breath for 3-5 seconds or for as long as possible. Allow the piston or ball to fall to the bottom of the column. 7. Remove the mouthpiece from your mouth and breathe out normally. 8. Rest for a few seconds and repeat Steps 1 through 7 at least 10 times every 1-2 hours when you are awake. Take your time and take a few normal breaths between deep breaths. 9. The spirometer may include an indicator to show your best effort. Use the indicator as a goal to work toward during each repetition. 10. After each set of 10 deep breaths, practice coughing to be sure your lungs are clear. If you have an  incision (the cut made at the time of surgery), support your incision when coughing by placing a pillow or rolled up towels firmly  against it. Once you are able to get out of bed, walk around indoors and cough well. You may stop using the incentive spirometer when instructed by your caregiver.  RISKS AND COMPLICATIONS  Take your time so you do not get dizzy or light-headed.  If you are in pain, you may need to take or ask for pain medication before doing incentive spirometry. It is harder to take a deep breath if you are having pain. AFTER USE  Rest and breathe slowly and easily.  It can be helpful to keep track of a log of your progress. Your caregiver can provide you with a simple table to help with this. If you are using the spirometer at home, follow these instructions: [place] IF:   You are having difficultly using the spirometer.  You have trouble using the spirometer as often as instructed.  Your pain medication is not giving enough relief while using the spirometer.  You develop fever of 100.5 F (38.1 C) or higher. SEEK IMMEDIATE MEDICAL CARE IF:   You cough up bloody sputum that had not been present before.  You develop fever of 102 F (38.9 C) or greater.  You develop worsening pain at or near the incision site. MAKE SURE YOU:   Understand these instructions.  Will watch your condition.  Will get help right away if you are not doing well or get worse. Document Released: 06/10/2006 Document Revised: 04/22/2011 Document Reviewed: 08/11/2006 [facility] Patient Information 2014 [place], [place].   WHAT IS A BLOOD TRANSFUSION? Blood Transfusion Information  A transfusion is the replacement of blood or some of its parts. Blood is made up of multiple cells which provide different functions.  Red blood cells carry oxygen and are used for blood loss replacement.  White blood cells fight against infection.  Platelets control bleeding.  Plasma helps clot blood.  Other blood products are available for specialized needs, such as hemophilia or other clotting disorders. BEFORE THE  TRANSFUSION  Who gives blood for transfusions?   Healthy volunteers who are fully evaluated to make sure their blood is safe. This is blood bank blood. Transfusion therapy is the safest it has ever been in the practice of medicine. Before blood is taken from a donor, a complete history is taken to make sure that person has no history of diseases nor engages in risky social behavior (examples are intravenous drug use or sexual activity with multiple partners). The donor's travel history is screened to minimize risk of transmitting infections, such as malaria. The donated blood is tested for signs of infectious diseases, such as HIV and hepatitis. The blood is then tested to be sure it is compatible with you in order to minimize the chance of a transfusion reaction. If you or a relative donates blood, this is often done in anticipation of surgery and is not appropriate for emergency situations. It takes many days to process the donated blood. RISKS AND COMPLICATIONS Although transfusion therapy is very safe and saves many lives, the main dangers of transfusion include:   Getting an infectious disease.  Developing a transfusion reaction. This is an allergic reaction to something in the blood you were given. Every precaution is taken to prevent this. The decision to have a blood transfusion has been considered carefully by your caregiver before blood is given. Blood is not given unless the  benefits outweigh the risks. AFTER THE TRANSFUSION  Right after receiving a blood transfusion, you will usually feel much better and more energetic. This is especially true if your red blood cells have gotten low (anemic). The transfusion raises the level of the red blood cells which carry oxygen, and this usually causes an energy increase.  The nurse administering the transfusion will monitor you carefully for complications. HOME CARE INSTRUCTIONS  No special instructions are needed after a transfusion. You may find  your energy is better. Speak with your caregiver about any limitations on activity for underlying diseases you may have. SEEK MEDICAL CARE IF:   Your condition is not improving after your transfusion.  You develop redness or irritation at the intravenous (IV) site. SEEK IMMEDIATE MEDICAL CARE IF:  Any of the following symptoms occur over the next 12 hours:  Shaking chills.  You have a temperature by mouth above 102 F (38.9 C), not controlled by medicine.  Chest, back, or muscle pain.  People around you feel you are not acting correctly or are confused.  Shortness of breath or difficulty breathing.  Dizziness and fainting.  You get a rash or develop hives.  You have a decrease in urine output.  Your urine turns a dark color or changes to pink, red, or brown. Any of the following symptoms occur over the next 10 days:  You have a temperature by mouth above 102 F (38.9 C), not controlled by medicine.  Shortness of breath.  Weakness after normal activity.  The white part of the eye turns yellow (jaundice).  You have a decrease in the amount of urine or are urinating less often.  Your urine turns a dark color or changes to pink, red, or brown. Document Released: 01/26/2000 Document Revised: 04/22/2011 Document Reviewed: 09/14/2007 [facility] Patient Information 2014 [place].

## 2020-05-22 NOTE — Progress Notes (Signed)
Please enter orders for PAT visit scheduled for 05-24-20

## 2020-05-23 ENCOUNTER — Encounter: Payer: Self-pay | Admitting: Family

## 2020-05-23 ENCOUNTER — Other Ambulatory Visit: Payer: Self-pay | Admitting: Physician Assistant

## 2020-05-24 ENCOUNTER — Other Ambulatory Visit: Payer: Self-pay

## 2020-05-24 ENCOUNTER — Encounter (HOSPITAL_COMMUNITY): Payer: Self-pay

## 2020-05-24 ENCOUNTER — Encounter (HOSPITAL_COMMUNITY)
Admission: RE | Admit: 2020-05-24 | Discharge: 2020-05-24 | Disposition: A | Discharge status: Home / Self Care | Payer: Medicare Other | Source: Ambulatory Visit | Attending: Orthopaedic Surgery | Admitting: Orthopaedic Surgery

## 2020-05-24 DIAGNOSIS — Z01818 Encounter for other preprocedural examination: Secondary | ICD-10-CM | POA: Diagnosis present

## 2020-05-24 HISTORY — DX: Anxiety disorder, unspecified: F41.9

## 2020-05-24 HISTORY — DX: Other complications of anesthesia, initial encounter: T88.59XA

## 2020-05-24 HISTORY — DX: Pneumonia, unspecified organism: J18.9

## 2020-05-24 HISTORY — DX: Gastro-esophageal reflux disease without esophagitis: K21.9

## 2020-05-24 HISTORY — DX: Chest pain, unspecified: R07.9

## 2020-05-24 LAB — BASIC METABOLIC PANEL
Anion gap: 8 (ref 5–15)
BUN: 9 mg/dL (ref 8–23)
CO2: 26 mmol/L (ref 22–32)
Calcium: 9.6 mg/dL (ref 8.9–10.3)
Chloride: 106 mmol/L (ref 98–111)
Creatinine, Ser: 0.8 mg/dL (ref 0.44–1.00)
GFR, Estimated: >60 mL/min (ref >60)
Glucose, Bld: 89 mg/dL (ref 70–99)
Potassium: 3.8 mmol/L (ref 3.5–5.1)
Sodium: 140 mmol/L (ref 135–145)

## 2020-05-24 LAB — CBC
HCT: 47.9 % — ABNORMAL HIGH (ref 36.0–46.0)
Hemoglobin: 15.5 g/dL — ABNORMAL HIGH (ref 12.0–15.0)
MCH: 29.6 pg (ref 26.0–34.0)
MCHC: 32.4 g/dL (ref 30.0–36.0)
MCV: 91.6 fL (ref 80.0–100.0)
Platelets: 358 10*3/uL (ref 150–400)
RBC: 5.23 MIL/uL — ABNORMAL HIGH (ref 3.87–5.11)
RDW: 13 % (ref 11.5–15.5)
WBC: 8.4 10*3/uL (ref 4.0–10.5)
nRBC: 0 % (ref 0.0–0.2)

## 2020-05-24 LAB — SURGICAL PCR SCREEN
MRSA, PCR: NEGATIVE
Staphylococcus aureus: NEGATIVE

## 2020-05-25 ENCOUNTER — Encounter: Payer: Self-pay | Admitting: Orthopaedic Surgery

## 2020-05-25 ENCOUNTER — Encounter: Payer: Self-pay | Admitting: Oncology

## 2020-05-29 ENCOUNTER — Inpatient Hospital Stay: Payer: Medicare Other | Admitting: Orthopaedic Surgery

## 2020-05-31 ENCOUNTER — Other Ambulatory Visit
Admission: RE | Admit: 2020-05-31 | Discharge: 2020-05-31 | Disposition: A | Discharge status: Home / Self Care | Payer: Medicare Other | Source: Ambulatory Visit | Attending: Orthopaedic Surgery | Admitting: Orthopaedic Surgery

## 2020-05-31 ENCOUNTER — Other Ambulatory Visit: Payer: Self-pay

## 2020-05-31 DIAGNOSIS — Z01812 Encounter for preprocedural laboratory examination: Secondary | ICD-10-CM | POA: Insufficient documentation

## 2020-05-31 DIAGNOSIS — Z20822 Contact with and (suspected) exposure to covid-19: Secondary | ICD-10-CM | POA: Insufficient documentation

## 2020-06-01 LAB — SARS CORONAVIRUS 2 (TAT 6-24 HRS): SARS Coronavirus 2: NEGATIVE

## 2020-06-01 NOTE — H&P (Signed)
TOTAL KNEE ADMISSION H&P  Patient is being admitted for right total knee arthroplasty.  Subjective:  Chief Complaint:right knee pain.  HPI: Lynn Peters, 65 y.o. female, has a history of pain and functional disability in the right knee due to arthritis and has failed non-surgical conservative treatments for greater than 12 weeks to includeNSAID's and/or analgesics, corticosteriod injections, flexibility and strengthening excercises, use of assistive devices and activity modification.  Onset of symptoms was gradual, starting 5 years ago with gradually worsening course since that time. The patient noted prior procedures on the knee to include  arthroscopy and ACL reconstruction on the right knee(s).  Patient currently rates pain in the right knee(s) at 10 out of 10 with activity. Patient has night pain, worsening of pain with activity and weight bearing, pain that interferes with activities of daily living, pain with passive range of motion, crepitus and joint swelling.  Patient has evidence of subchondral sclerosis, periarticular osteophytes and joint space narrowing by imaging studies. There is no active infection.  Patient Active Problem List   Diagnosis Date Noted  . Erythrocytosis 05/07/2020  . Fatigue 05/07/2020  . Welcome to Medicare preventive visit 05/03/2020  . Unilateral primary osteoarthritis, right knee 02/16/2020  . Abnormal CT scan, gastrointestinal tract   . Diarrhea   . Suprapubic pain 11/10/2018  . Disorder of ureter 11/10/2018  . Abdominal pain 10/23/2018  . Arthritis 09/16/2017  . Hyperlipidemia LDL goal <160 08/30/2017  . Apneic episode 08/29/2017  . Atypical chest pain 08/29/2017  . Acquired bilateral flat feet 08/20/2017  . Onychomycosis 08/20/2017  . Reflux esophagitis 02/16/2017  . Acute left-sided low back pain with left-sided sciatica 09/16/2016  . Dysuria 08/15/2016  . Overweight (BMI 25.0-29.9) 08/15/2016  . Allergic rhinitis due to pollen 08/05/2016  . PVC  (premature ventricular contraction) 03/29/2016  . Mild persistent asthma 10/04/2015  . Abnormal Pap smear of cervix 10/04/2015  . Colon cancer screening 02/28/2015  . Family history of diabetes mellitus 02/16/2015   Past Medical History:  Diagnosis Date  . Allergy   . Anxiety   . Arthritis   . Asthma   . Chest pain   . Chicken pox   . Complication of anesthesia    Difficulty breathing after GYN procedure  . Diverticulitis 2009  . GERD (gastroesophageal reflux disease)   . Pneumonia     Past Surgical History:  Procedure Laterality Date  . ABDOMINAL HYSTERECTOMY  2004   partial; has cervix and ovaries per patient; done for fibroids, noncancerous  . ANTERIOR CRUCIATE LIGAMENT REPAIR Right 2000  . COLONOSCOPY WITH PROPOFOL N/A 04/28/2019   Procedure: COLONOSCOPY WITH PROPOFOL;  Surgeon: Lin Landsman, MD;  Location: [facility] ENDOSCOPY;  Service: Gastroenterology;  Laterality: N/A;  . ESOPHAGOGASTRODUODENOSCOPY (EGD) WITH PROPOFOL N/A 04/28/2019   Procedure: ESOPHAGOGASTRODUODENOSCOPY (EGD) WITH PROPOFOL;  Surgeon: Lin Landsman, MD;  Location: [facility] ENDOSCOPY;  Service: Gastroenterology;  Laterality: N/A;  . LAPAROSCOPY    . UTERINE FIBROID SURGERY      Current Facility-Administered Medications  Medication Dose Route Frequency Provider Last Rate Last Admin  . ipratropium-albuterol (DUONEB) 0.5-2.5 (3) MG/3ML nebulizer solution 3 mL  3 mL Nebulization Q6H Burnard Hawthorne, FNP       Current Outpatient Medications  Medication Sig Dispense Refill Last Dose  . albuterol (VENTOLIN HFA) 108 (90 Base) MCG/ACT inhaler INHALE 2 PUFFS INTO THE LUNGS EVERY 4 (FOUR) HOURS AS NEEDED FOR WHEEZING OR SHORTNESS OF BREATH. (Patient taking differently: Inhale 2 puffs into the lungs every  4 (four) hours as needed for shortness of breath or wheezing.) 18 g 2   . famotidine (PEPCID) 20 MG tablet Take 1 tablet (20 mg total) by mouth daily. 30 tablet 2   . ipratropium-albuterol (DUONEB) 0.5-2.5  (3) MG/3ML SOLN Take 3 mLs by nebulization every 8 (eight) hours as needed. 360 mL 11   . PRESCRIPTION MEDICATION Inhale 2 puffs into the lungs in the morning and at bedtime. Flixotide Inhaler     . Probiotic Product (SOLUBLE FIBER/PROBIOTICS PO) Take 1 capsule by mouth daily.     . beclomethasone (QVAR REDIHALER) 80 MCG/ACT inhaler Inhale 2 puffs into the lungs 2 (two) times daily. (Patient not taking: Reported on 05/17/2020) 10.6 g 6 Not Taking at Unknown time  . phenazopyridine (PYRIDIUM) 200 MG tablet Take 1 tablet (200 mg total) by mouth 3 (three) times daily as needed for pain. (Patient not taking: Reported on 05/17/2020) 10 tablet 0 Not Taking at Unknown time   Allergies  Allergen Reactions  . Pantoprazole Sodium Shortness Of Breath  . Tetracyclines & Related   . Breo Ellipta [Fluticasone Furoate-Vilanterol] Palpitations  . Flagyl [Metronidazole] Other (See Comments)    Neck pain, spasm    Social History   Tobacco Use  . Smoking status: Never Smoker  . Smokeless tobacco: Never Used  Substance Use Topics  . Alcohol use: Yes    Alcohol/week: 0.0 standard drinks    Comment: rare    Family History  Problem Relation Age of Onset  . Alzheimer's disease Mother   . Asthma Mother   . Diabetes Mother   . Hyperlipidemia Mother   . Hypertension Mother   . Stroke Mother   . Heart disease Father        CHF  . Diabetes Father   . Hyperlipidemia Father   . Alcohol abuse Brother   . Heart disease Brother   . Asthma Sister   . Diabetes Sister   . Breast cancer Neg Hx      Review of Systems  Musculoskeletal: Positive for gait problem and joint swelling.  All other systems reviewed and are negative.   Objective:  Physical Exam Vitals reviewed.  Constitutional:      Appearance: Normal appearance.  HENT:     Head: Normocephalic and atraumatic.  Eyes:     Extraocular Movements: Extraocular movements intact.     Pupils: Pupils are equal, round, and reactive to light.   Cardiovascular:     Rate and Rhythm: Normal rate and regular rhythm.     Pulses: Normal pulses.  Pulmonary:     Effort: Pulmonary effort is normal.     Breath sounds: Normal breath sounds.  Abdominal:     Palpations: Abdomen is soft.  Musculoskeletal:     Cervical back: Normal range of motion.     Right knee: Effusion and bony tenderness present. Decreased range of motion. Tenderness present over the medial joint line, lateral joint line and patellar tendon. ACL laxity present. Abnormal alignment.  Neurological:     Mental Status: She is alert and oriented to person, place, and time.  Psychiatric:        Behavior: Behavior normal.     Vital signs in last 24 hours:    Labs:   Estimated body mass index is 24.58 kg/m as calculated from the following:   Height as of 05/24/20: 5' 5.5" (1.664 m).   Weight as of 05/24/20: 68 kg.   Imaging Review Plain radiographs demonstrate severe degenerative joint disease  of the right knee(s). The overall alignment ismild varus. The bone quality appears to be good for age and reported activity level.      Assessment/Plan:  End stage arthritis, right knee   The patient history, physical examination, clinical judgment of the provider and imaging studies are consistent with end stage degenerative joint disease of the right knee(s) and total knee arthroplasty is deemed medically necessary. The treatment options including medical management, injection therapy arthroscopy and arthroplasty were discussed at length. The risks and benefits of total knee arthroplasty were presented and reviewed. The risks due to aseptic loosening, infection, stiffness, patella tracking problems, thromboembolic complications and other imponderables were discussed. The patient acknowledged the explanation, agreed to proceed with the plan and consent was signed. Patient is being admitted for inpatient treatment for surgery, pain control, PT, OT, prophylactic antibiotics, VTE  prophylaxis, progressive ambulation and ADL's and discharge planning. The patient is planning to be discharged home with home health services

## 2020-06-01 NOTE — Anesthesia Preprocedure Evaluation (Addendum)
Anesthesia Evaluation  Patient identified by MRN, date of birth, ID band Patient awake    Reviewed: Allergy & Precautions, NPO status , Patient's Chart, lab work & pertinent test results  History of Anesthesia Complications Negative for: history of anesthetic complications  Airway Mallampati: II  TM Distance: >3 FB Neck ROM: Full    Dental  (+) Dental Advisory Given   Pulmonary asthma ,    Pulmonary exam normal        Cardiovascular negative cardio ROS Normal cardiovascular exam     Neuro/Psych PSYCHIATRIC DISORDERS Anxiety negative neurological ROS     GI/Hepatic Neg liver ROS, GERD  Controlled,  Endo/Other  negative endocrine ROS  Renal/GU negative Renal ROS     Musculoskeletal  (+) Arthritis ,   Abdominal   Peds  Hematology negative hematology ROS (+)  Plt 358k    Anesthesia Other Findings Covid test negative   Reproductive/Obstetrics                            Anesthesia Physical Anesthesia Plan  ASA: II  Anesthesia Plan: Spinal   Post-op Pain Management:  Regional for Post-op pain   Induction:   PONV Risk Score and Plan: 2 and Treatment may vary due to age or medical condition and Propofol infusion  Airway Management Planned: Natural Airway and Simple Face Mask  Additional Equipment: None  Intra-op Plan:   Post-operative Plan:   Informed Consent: I have reviewed the patients History and Physical, chart, labs and discussed the procedure including the risks, benefits and alternatives for the proposed anesthesia with the patient or authorized representative who has indicated his/her understanding and acceptance.       Plan Discussed with: CRNA and Anesthesiologist  Anesthesia Plan Comments: (Labs reviewed, platelets acceptable. Discussed risks and benefits of spinal, including spinal/epidural hematoma, infection, failed block, and PDPH. Patient expressed  understanding and wished to proceed. )       Anesthesia Quick Evaluation

## 2020-06-02 ENCOUNTER — Encounter (HOSPITAL_COMMUNITY)
Admission: RE | Disposition: A | Discharge status: Home / Self Care | Payer: Self-pay | Source: Ambulatory Visit | Attending: Orthopaedic Surgery

## 2020-06-02 ENCOUNTER — Ambulatory Visit (HOSPITAL_COMMUNITY): Payer: Medicare Other | Admitting: Certified Registered"

## 2020-06-02 ENCOUNTER — Observation Stay (HOSPITAL_COMMUNITY): Payer: Medicare Other

## 2020-06-02 ENCOUNTER — Encounter (HOSPITAL_COMMUNITY): Payer: Self-pay | Admitting: Orthopaedic Surgery

## 2020-06-02 ENCOUNTER — Observation Stay (HOSPITAL_COMMUNITY)
Admission: RE | Admit: 2020-06-02 | Discharge: 2020-06-03 | Disposition: A | Discharge status: Home / Self Care | Payer: Medicare Other | Source: Ambulatory Visit | Attending: Orthopaedic Surgery | Admitting: Orthopaedic Surgery

## 2020-06-02 ENCOUNTER — Ambulatory Visit (HOSPITAL_COMMUNITY): Payer: Medicare Other | Admitting: Physician Assistant

## 2020-06-02 ENCOUNTER — Other Ambulatory Visit: Payer: Self-pay

## 2020-06-02 DIAGNOSIS — Z96651 Presence of right artificial knee joint: Secondary | ICD-10-CM

## 2020-06-02 DIAGNOSIS — Z6825 Body mass index (BMI) 25.0-25.9, adult: Secondary | ICD-10-CM | POA: Insufficient documentation

## 2020-06-02 DIAGNOSIS — K219 Gastro-esophageal reflux disease without esophagitis: Secondary | ICD-10-CM | POA: Insufficient documentation

## 2020-06-02 DIAGNOSIS — Z7951 Long term (current) use of inhaled steroids: Secondary | ICD-10-CM | POA: Insufficient documentation

## 2020-06-02 DIAGNOSIS — M1711 Unilateral primary osteoarthritis, right knee: Secondary | ICD-10-CM | POA: Diagnosis not present

## 2020-06-02 DIAGNOSIS — E663 Overweight: Secondary | ICD-10-CM | POA: Insufficient documentation

## 2020-06-02 DIAGNOSIS — E785 Hyperlipidemia, unspecified: Secondary | ICD-10-CM | POA: Insufficient documentation

## 2020-06-02 DIAGNOSIS — D750 Familial erythrocytosis: Secondary | ICD-10-CM | POA: Insufficient documentation

## 2020-06-02 DIAGNOSIS — J45909 Unspecified asthma, uncomplicated: Secondary | ICD-10-CM

## 2020-06-02 DIAGNOSIS — Z79899 Other long term (current) drug therapy: Secondary | ICD-10-CM | POA: Diagnosis not present

## 2020-06-02 HISTORY — PX: TOTAL KNEE ARTHROPLASTY: SHX125

## 2020-06-02 LAB — TYPE AND SCREEN
ABO/RH(D): B POS
Antibody Screen: NEGATIVE

## 2020-06-02 LAB — ABO/RH: ABO/RH(D): B POS

## 2020-06-02 SURGERY — ARTHROPLASTY, KNEE, TOTAL
Anesthesia: Spinal | Site: Knee | Laterality: Right

## 2020-06-02 MED ORDER — EPHEDRINE 5 MG/ML INJ
INTRAVENOUS | Status: AC
  Filled 2020-06-02: qty 10

## 2020-06-02 MED ORDER — CEFAZOLIN SODIUM-DEXTROSE 1-4 GM/50ML-% IV SOLN
1.0000 g | Freq: Four times a day (QID) | INTRAVENOUS | Status: AC
  Administered 2020-06-02 (×2): 1 g via INTRAVENOUS
  Filled 2020-06-02 (×2): qty 50

## 2020-06-02 MED ORDER — ALUM & MAG HYDROXIDE-SIMETH 200-200-20 MG/5ML PO SUSP: 30.0000 mL | ORAL | Status: DC | PRN

## 2020-06-02 MED ORDER — SODIUM CHLORIDE 0.9 % IV SOLN: INTRAVENOUS | Status: DC

## 2020-06-02 MED ORDER — ZOLPIDEM TARTRATE 5 MG PO TABS: 5.0000 mg | ORAL_TABLET | Freq: Every evening | ORAL | Status: DC | PRN

## 2020-06-02 MED ORDER — DOCUSATE SODIUM 100 MG PO CAPS
100.0000 mg | ORAL_CAPSULE | Freq: Two times a day (BID) | ORAL | Status: DC
  Administered 2020-06-02 – 2020-06-03 (×2): 100 mg via ORAL
  Filled 2020-06-02 (×2): qty 1

## 2020-06-02 MED ORDER — ACETAMINOPHEN 325 MG PO TABS
325.0000 mg | ORAL_TABLET | Freq: Four times a day (QID) | ORAL | Status: DC | PRN
  Administered 2020-06-02: 650 mg via ORAL
  Administered 2020-06-03: 325 mg via ORAL
  Filled 2020-06-02 (×2): qty 1
  Filled 2020-06-02: qty 2

## 2020-06-02 MED ORDER — KETOROLAC TROMETHAMINE 15 MG/ML IJ SOLN
7.5000 mg | Freq: Four times a day (QID) | INTRAMUSCULAR | Status: AC
  Administered 2020-06-02 – 2020-06-03 (×4): 7.5 mg via INTRAVENOUS
  Filled 2020-06-02 (×3): qty 1

## 2020-06-02 MED ORDER — METOCLOPRAMIDE HCL 5 MG/ML IJ SOLN: 5.0000 mg | Freq: Three times a day (TID) | INTRAMUSCULAR | Status: DC | PRN

## 2020-06-02 MED ORDER — ORAL CARE MOUTH RINSE
15.0000 mL | Freq: Once | OROMUCOSAL | Status: AC
  Administered 2020-06-02: 15 mL via OROMUCOSAL

## 2020-06-02 MED ORDER — 0.9 % SODIUM CHLORIDE (POUR BTL) OPTIME
TOPICAL | Status: DC | PRN
  Administered 2020-06-02: 1000 mL

## 2020-06-02 MED ORDER — MENTHOL 3 MG MT LOZG: 1.0000 | LOZENGE | OROMUCOSAL | Status: DC | PRN

## 2020-06-02 MED ORDER — ONDANSETRON HCL 4 MG/2ML IJ SOLN
INTRAMUSCULAR | Status: AC
  Filled 2020-06-02: qty 2

## 2020-06-02 MED ORDER — FAMOTIDINE 20 MG PO TABS
20.0000 mg | ORAL_TABLET | Freq: Every day | ORAL | Status: DC
  Administered 2020-06-02 – 2020-06-03 (×2): 20 mg via ORAL
  Filled 2020-06-02 (×2): qty 1

## 2020-06-02 MED ORDER — SODIUM CHLORIDE 0.9 % IR SOLN
Status: DC | PRN
  Administered 2020-06-02: 1000 mL

## 2020-06-02 MED ORDER — FENTANYL CITRATE (PF) 100 MCG/2ML IJ SOLN: 25.0000 ug | INTRAMUSCULAR | Status: DC | PRN

## 2020-06-02 MED ORDER — MIDAZOLAM HCL 2 MG/2ML IJ SOLN
INTRAMUSCULAR | Status: DC | PRN
  Administered 2020-06-02: 2 mg via INTRAVENOUS

## 2020-06-02 MED ORDER — FENTANYL CITRATE (PF) 100 MCG/2ML IJ SOLN
INTRAMUSCULAR | Status: AC
  Filled 2020-06-02: qty 2

## 2020-06-02 MED ORDER — FAMOTIDINE 20 MG PO TABS: 20.0000 mg | ORAL_TABLET | Freq: Every day | ORAL | Status: DC

## 2020-06-02 MED ORDER — LACTATED RINGERS IV SOLN: INTRAVENOUS | Status: DC

## 2020-06-02 MED ORDER — ONDANSETRON HCL 4 MG/2ML IJ SOLN
INTRAMUSCULAR | Status: DC | PRN
  Administered 2020-06-02: 4 mg via INTRAVENOUS

## 2020-06-02 MED ORDER — POVIDONE-IODINE 10 % EX SWAB
2.0000 "application " | Freq: Once | CUTANEOUS | Status: AC
  Administered 2020-06-02: 2 via TOPICAL

## 2020-06-02 MED ORDER — ALBUTEROL SULFATE HFA 108 (90 BASE) MCG/ACT IN AERS: 2.0000 | INHALATION_SPRAY | RESPIRATORY_TRACT | Status: DC | PRN

## 2020-06-02 MED ORDER — BUPIVACAINE IN DEXTROSE 0.75-8.25 % IT SOLN
INTRATHECAL | Status: DC | PRN
  Administered 2020-06-02: 1.6 mL via INTRATHECAL

## 2020-06-02 MED ORDER — PHENYLEPHRINE 40 MCG/ML (10ML) SYRINGE FOR IV PUSH (FOR BLOOD PRESSURE SUPPORT)
PREFILLED_SYRINGE | INTRAVENOUS | Status: DC | PRN
  Administered 2020-06-02: 40 ug via INTRAVENOUS

## 2020-06-02 MED ORDER — DEXAMETHASONE SODIUM PHOSPHATE 10 MG/ML IJ SOLN
INTRAMUSCULAR | Status: AC
  Filled 2020-06-02: qty 1

## 2020-06-02 MED ORDER — OXYCODONE HCL 5 MG/5ML PO SOLN
5.0000 mg | Freq: Once | ORAL | Status: DC | PRN
Start: 2020-06-02 — End: 2020-06-02

## 2020-06-02 MED ORDER — HYDROMORPHONE HCL 1 MG/ML IJ SOLN
0.5000 mg | INTRAMUSCULAR | Status: DC | PRN
  Administered 2020-06-02 (×2): 1 mg via INTRAVENOUS
  Filled 2020-06-02 (×2): qty 1

## 2020-06-02 MED ORDER — PROPOFOL 10 MG/ML IV BOLUS
INTRAVENOUS | Status: AC
  Filled 2020-06-02: qty 40

## 2020-06-02 MED ORDER — PROPOFOL 1000 MG/100ML IV EMUL
INTRAVENOUS | Status: AC
  Filled 2020-06-02: qty 100

## 2020-06-02 MED ORDER — ROPIVACAINE HCL 7.5 MG/ML IJ SOLN
INTRAMUSCULAR | Status: DC | PRN
  Administered 2020-06-02: 20 mL via PERINEURAL

## 2020-06-02 MED ORDER — CHLORHEXIDINE GLUCONATE 0.12 % MT SOLN: 15.0000 mL | Freq: Once | OROMUCOSAL | Status: AC

## 2020-06-02 MED ORDER — BUPIVACAINE-EPINEPHRINE (PF) 0.25% -1:200000 IJ SOLN
INTRAMUSCULAR | Status: AC
  Filled 2020-06-02: qty 30

## 2020-06-02 MED ORDER — OXYCODONE HCL 5 MG PO TABS
10.0000 mg | ORAL_TABLET | ORAL | Status: DC | PRN
  Administered 2020-06-02: 10 mg via ORAL
  Administered 2020-06-02: 15 mg via ORAL
  Administered 2020-06-02 – 2020-06-03 (×2): 10 mg via ORAL
  Filled 2020-06-02: qty 2
  Filled 2020-06-02: qty 3
  Filled 2020-06-02: qty 2

## 2020-06-02 MED ORDER — ONDANSETRON HCL 4 MG/2ML IJ SOLN: 4.0000 mg | Freq: Four times a day (QID) | INTRAMUSCULAR | Status: DC | PRN

## 2020-06-02 MED ORDER — ONDANSETRON HCL 4 MG/2ML IJ SOLN: 4.0000 mg | Freq: Once | INTRAMUSCULAR | Status: DC | PRN

## 2020-06-02 MED ORDER — METHOCARBAMOL 500 MG IVPB - SIMPLE MED
500.0000 mg | Freq: Four times a day (QID) | INTRAVENOUS | Status: DC | PRN
  Administered 2020-06-02: 500 mg via INTRAVENOUS
  Filled 2020-06-02 (×2): qty 50

## 2020-06-02 MED ORDER — IPRATROPIUM-ALBUTEROL 0.5-2.5 (3) MG/3ML IN SOLN: 3.0000 mL | Freq: Four times a day (QID) | RESPIRATORY_TRACT | Status: DC | PRN

## 2020-06-02 MED ORDER — DEXAMETHASONE SODIUM PHOSPHATE 10 MG/ML IJ SOLN
INTRAMUSCULAR | Status: DC | PRN
  Administered 2020-06-02: 8 mg via INTRAVENOUS

## 2020-06-02 MED ORDER — METOCLOPRAMIDE HCL 5 MG PO TABS
5.0000 mg | ORAL_TABLET | Freq: Three times a day (TID) | ORAL | Status: DC | PRN
  Filled 2020-06-02: qty 2

## 2020-06-02 MED ORDER — STERILE WATER FOR IRRIGATION IR SOLN
Status: DC | PRN
  Administered 2020-06-02: 2000 mL

## 2020-06-02 MED ORDER — EPHEDRINE SULFATE-NACL 50-0.9 MG/10ML-% IV SOSY
PREFILLED_SYRINGE | INTRAVENOUS | Status: DC | PRN
  Administered 2020-06-02 (×3): 5 mg via INTRAVENOUS

## 2020-06-02 MED ORDER — FENTANYL CITRATE (PF) 100 MCG/2ML IJ SOLN
INTRAMUSCULAR | Status: DC | PRN
  Administered 2020-06-02 (×2): 50 ug via INTRAVENOUS

## 2020-06-02 MED ORDER — OXYCODONE HCL 5 MG PO TABS: 5.0000 mg | ORAL_TABLET | Freq: Once | ORAL | Status: DC | PRN

## 2020-06-02 MED ORDER — CEFAZOLIN SODIUM-DEXTROSE 2-4 GM/100ML-% IV SOLN
2.0000 g | INTRAVENOUS | Status: AC
  Administered 2020-06-02: 2 g via INTRAVENOUS
  Filled 2020-06-02: qty 100

## 2020-06-02 MED ORDER — BUPIVACAINE-EPINEPHRINE 0.25% -1:200000 IJ SOLN
INTRAMUSCULAR | Status: DC | PRN
  Administered 2020-06-02: 30 mL

## 2020-06-02 MED ORDER — PHENOL 1.4 % MT LIQD: 1.0000 | OROMUCOSAL | Status: DC | PRN

## 2020-06-02 MED ORDER — TRANEXAMIC ACID-NACL 1000-0.7 MG/100ML-% IV SOLN
1000.0000 mg | INTRAVENOUS | Status: AC
  Administered 2020-06-02: 1000 mg via INTRAVENOUS
  Filled 2020-06-02: qty 100

## 2020-06-02 MED ORDER — ASPIRIN 81 MG PO CHEW
81.0000 mg | CHEWABLE_TABLET | Freq: Two times a day (BID) | ORAL | Status: DC
  Administered 2020-06-02 – 2020-06-03 (×2): 81 mg via ORAL
  Filled 2020-06-02 (×2): qty 1

## 2020-06-02 MED ORDER — POLYETHYLENE GLYCOL 3350 17 G PO PACK: 17.0000 g | PACK | Freq: Every day | ORAL | Status: DC | PRN

## 2020-06-02 MED ORDER — ONDANSETRON HCL 4 MG PO TABS
4.0000 mg | ORAL_TABLET | Freq: Four times a day (QID) | ORAL | Status: DC | PRN
  Filled 2020-06-02: qty 1

## 2020-06-02 MED ORDER — METHOCARBAMOL 500 MG PO TABS
500.0000 mg | ORAL_TABLET | Freq: Four times a day (QID) | ORAL | Status: DC | PRN
  Administered 2020-06-02: 500 mg via ORAL
  Filled 2020-06-02: qty 1

## 2020-06-02 MED ORDER — PHENYLEPHRINE 40 MCG/ML (10ML) SYRINGE FOR IV PUSH (FOR BLOOD PRESSURE SUPPORT)
PREFILLED_SYRINGE | INTRAVENOUS | Status: AC
  Filled 2020-06-02: qty 10

## 2020-06-02 MED ORDER — MIDAZOLAM HCL 2 MG/2ML IJ SOLN
INTRAMUSCULAR | Status: AC
  Filled 2020-06-02: qty 2

## 2020-06-02 MED ORDER — LIDOCAINE 2% (20 MG/ML) 5 ML SYRINGE
INTRAMUSCULAR | Status: AC
  Filled 2020-06-02: qty 5

## 2020-06-02 MED ORDER — DIPHENHYDRAMINE HCL 12.5 MG/5ML PO ELIX: 12.5000 mg | ORAL_SOLUTION | ORAL | Status: DC | PRN

## 2020-06-02 MED ORDER — PROPOFOL 500 MG/50ML IV EMUL
INTRAVENOUS | Status: DC | PRN
  Administered 2020-06-02: 80 ug/kg/min via INTRAVENOUS

## 2020-06-02 MED ORDER — OXYCODONE HCL 5 MG PO TABS
5.0000 mg | ORAL_TABLET | ORAL | Status: DC | PRN
  Administered 2020-06-03 (×3): 10 mg via ORAL
  Filled 2020-06-02 (×4): qty 2

## 2020-06-02 SURGICAL SUPPLY — 59 items
APL SKNCLS STERI-STRIP NONHPOA (GAUZE/BANDAGES/DRESSINGS) ×1
BAG SPEC THK2 15X12 ZIP CLS (MISCELLANEOUS) ×1
BAG ZIPLOCK 12X15 (MISCELLANEOUS) ×2 IMPLANT
BENZOIN TINCTURE PRP APPL 2/3 (GAUZE/BANDAGES/DRESSINGS) ×2 IMPLANT
BLADE SAG 18X100X1.27 (BLADE) ×2 IMPLANT
BLADE SURG SZ10 CARB STEEL (BLADE) ×4 IMPLANT
BNDG ELASTIC 6X5.8 VLCR STR LF (GAUZE/BANDAGES/DRESSINGS) ×4 IMPLANT
BOWL SMART MIX CTS (DISPOSABLE) IMPLANT
BSPLAT TIB 4 KN TRITANIUM (Knees) ×1 IMPLANT
CLSR STERI-STRIP ANTIMIC 1/2X4 (GAUZE/BANDAGES/DRESSINGS) ×4 IMPLANT
COOLER ICEMAN CLASSIC (MISCELLANEOUS) ×2 IMPLANT
COVER SURGICAL LIGHT HANDLE (MISCELLANEOUS) ×2 IMPLANT
COVER WAND RF STERILE (DRAPES) IMPLANT
CUFF TOURN SGL QUICK 34 (TOURNIQUET CUFF) ×2
CUFF TRNQT CYL 34X4.125X (TOURNIQUET CUFF) ×1 IMPLANT
DECANTER SPIKE VIAL GLASS SM (MISCELLANEOUS) IMPLANT
DRAPE U-SHAPE 47X51 STRL (DRAPES) ×2 IMPLANT
DRSG PAD ABDOMINAL 8X10 ST (GAUZE/BANDAGES/DRESSINGS) ×4 IMPLANT
DURAPREP 26ML APPLICATOR (WOUND CARE) ×2 IMPLANT
ELECT BLADE TIP CTD 4 INCH (ELECTRODE) ×2 IMPLANT
ELECT REM PT RETURN 15FT ADLT (MISCELLANEOUS) ×2 IMPLANT
FEMORAL POSTERIOR SZ3 RT (Joint) ×1 IMPLANT
GAUZE SPONGE 4X4 12PLY STRL (GAUZE/BANDAGES/DRESSINGS) ×2 IMPLANT
GAUZE XEROFORM 1X8 LF (GAUZE/BANDAGES/DRESSINGS) ×2 IMPLANT
GLOVE SRG 8 PF TXTR STRL LF DI (GLOVE) ×2 IMPLANT
GLOVE SURG ENC MOIS LTX SZ7.5 (GLOVE) ×2 IMPLANT
GLOVE SURG LTX SZ8 (GLOVE) ×2 IMPLANT
GLOVE SURG UNDER POLY LF SZ8 (GLOVE) ×4
GOWN STRL REUS W/TWL XL LVL3 (GOWN DISPOSABLE) ×4 IMPLANT
HANDPIECE INTERPULSE COAX TIP (DISPOSABLE) ×2
HOLDER FOLEY CATH W/STRAP (MISCELLANEOUS) IMPLANT
IMMOBILIZER KNEE 20 (SOFTGOODS) ×2
IMMOBILIZER KNEE 20 THIGH 36 (SOFTGOODS) ×1 IMPLANT
INSERT TIBIAL TRIATHLON PS X3 (Insert) ×2 IMPLANT
KIT TURNOVER KIT A (KITS) ×2 IMPLANT
KNEE PATELLA ASYMMETRIC 9X29 (Knees) ×2 IMPLANT
KNEE TIBIAL COMP TRI SZ4 (Knees) ×2 IMPLANT
NS IRRIG 1000ML POUR BTL (IV SOLUTION) ×2 IMPLANT
PACK TOTAL KNEE CUSTOM (KITS) ×2 IMPLANT
PAD COLD SHLDR WRAP-ON (PAD) ×2 IMPLANT
PADDING CAST ABS 6INX4YD NS (CAST SUPPLIES) ×2
PADDING CAST ABS COTTON 6X4 NS (CAST SUPPLIES) ×2 IMPLANT
PADDING CAST COTTON 6X4 STRL (CAST SUPPLIES) ×4 IMPLANT
PENCIL SMOKE EVACUATOR (MISCELLANEOUS) IMPLANT
PIN FLUTED HEDLESS FIX 3.5X1/8 (PIN) ×2 IMPLANT
POSTERIOR FEMORAL SZ3 RT (Joint) ×2 IMPLANT
PROTECTOR NERVE ULNAR (MISCELLANEOUS) ×2 IMPLANT
SET HNDPC FAN SPRY TIP SCT (DISPOSABLE) ×1 IMPLANT
SET PAD KNEE POSITIONER (MISCELLANEOUS) ×2 IMPLANT
STAPLER VISISTAT 35W (STAPLE) IMPLANT
STRIP CLOSURE SKIN 1/2X4 (GAUZE/BANDAGES/DRESSINGS) IMPLANT
SUT MNCRL AB 4-0 PS2 18 (SUTURE) IMPLANT
SUT VIC AB 0 CT1 27 (SUTURE) ×2
SUT VIC AB 0 CT1 27XBRD ANTBC (SUTURE) ×1 IMPLANT
SUT VIC AB 1 CT1 36 (SUTURE) ×4 IMPLANT
SUT VIC AB 2-0 CT1 27 (SUTURE) ×4
SUT VIC AB 2-0 CT1 TAPERPNT 27 (SUTURE) ×2 IMPLANT
TRAY FOLEY MTR SLVR 16FR STAT (SET/KITS/TRAYS/PACK) IMPLANT
WATER STERILE IRR 1000ML POUR (IV SOLUTION) ×4 IMPLANT

## 2020-06-02 NOTE — Anesthesia Postprocedure Evaluation (Signed)
Anesthesia Post Note  Patient: Lynn Peters  Procedure(s) Performed: RIGHT TOTAL KNEE ARTHROPLASTY (Right Knee)     Patient location during evaluation: PACU Anesthesia Type: Spinal Level of consciousness: awake and alert Pain management: pain level controlled Vital Signs Assessment: post-procedure vital signs reviewed and stable Respiratory status: spontaneous breathing and respiratory function stable Cardiovascular status: blood pressure returned to baseline and stable Postop Assessment: spinal receding and no apparent nausea or vomiting Anesthetic complications: no   No complications documented.  Last Vitals:  Vitals:   06/02/20 1000 06/02/20 1015  BP: 126/83 121/74  Pulse: 76 82  Resp: 11 16  Temp:    SpO2: 100% 100%    Last Pain:  Vitals:   06/02/20 0945  TempSrc:   PainSc: [place]

## 2020-06-02 NOTE — Anesthesia Procedure Notes (Signed)
Spinal  Patient location during procedure: OR Start time: 06/02/2020 7:18 AM End time: 06/02/2020 7:21 AM Reason for block: surgical anesthesia Staffing Performed: anesthesiologist  Anesthesiologist: Audry Pili, MD Preanesthetic Checklist Completed: patient identified, IV checked, risks and benefits discussed, surgical consent, monitors and equipment checked, pre-op evaluation and timeout performed Spinal Block Patient position: sitting Prep: DuraPrep Patient monitoring: heart rate, cardiac monitor, continuous pulse ox and blood pressure Approach: midline Location: L3-4 Injection technique: single-shot Needle Needle type: Pencan  Needle gauge: 24 G Additional Notes Consent was obtained prior to the procedure with all questions answered and concerns addressed. Risks including, but not limited to, bleeding, infection, nerve damage, paralysis, failed block, inadequate analgesia, allergic reaction, high spinal, itching, and headache were discussed and the patient wished to proceed. Functioning IV was confirmed and monitors were applied. Sterile prep and drape, including hand hygiene, mask, and sterile gloves were used. The patient was positioned and the spine was prepped. The skin was anesthetized with lidocaine. Free flow of clear CSF was obtained prior to injecting local anesthetic into the CSF. The spinal needle aspirated freely following injection. The needle was carefully withdrawn. The patient tolerated the procedure well.   Lynn Don, MD

## 2020-06-02 NOTE — Evaluation (Signed)
Physical Therapy Evaluation Patient Details Name: Lynn Peters MRN: [id number] DOB: 11-06-1955 Today's Date: 06/02/2020   History of Present Illness  Patient is 65 y.o. female s/p Rt TKA on 06/02/20 with PMH significant for asthma, OA, anxiety, GERD.  Clinical Impression  Lynn Peters is a 65 y.o. female POD 0 s/p Rt TKA. Patient reports independence with mobility at baseline. Patient is now limited by functional impairments (see PT problem list below) and requires min assist for transfers and gait with RW. Patient was able to ambulate ~25 feet with RW and min assist and improved use of UE's to prevent Rt knee buckling throughout gait. Patient instructed in exercise to facilitate ROM and circulation to manage edema and reduce risk of DVT. Patient will benefit from continued skilled PT interventions to address impairments and progress towards PLOF. Acute PT will follow to progress mobility and stair training in preparation for safe discharge home.     Follow Up Recommendations Follow surgeon's recommendation for DC plan and follow-up therapies;Home health PT (pt would like to speak with agency/therapist that she be using for [facility])    Equipment Recommendations  None recommended by PT    Recommendations for Other Services       Precautions / Restrictions Precautions Precautions: Knee;Fall Restrictions Weight Bearing Restrictions: No Other Position/Activity Restrictions: WBAT      Mobility  Bed Mobility Overal bed mobility: Needs Assistance Bed Mobility: Supine to Sit     Supine to sit: Min assist;HOB elevated     General bed mobility comments: min cues for sequencing and to use bed rail, assist to bring Rt LE fully off EOB and scoot forward.    Transfers Overall transfer level: Needs assistance Equipment used: Rolling walker (2 wheeled) Transfers: Sit to/from Stand Sit to Stand: Min assist         General transfer comment: VC's for hand placement/technique, no  o  Ambulation/Gait Ambulation/Gait assistance: Min Web designer (Feet): 25 Feet Assistive device: Rolling walker (2 wheeled) Gait Pattern/deviations: Step-to pattern;Decreased stride length;Decreased weight shift to right;Decreased stance time - right Gait velocity: decr   General Gait Details: cues for safe step pattern and proximity for RW, blocking for Rt knee initially to prevent buckling and min assist for walker positioning.  Stairs            Wheelchair Mobility    Modified Rankin (Stroke Patients Only)       Balance Overall balance assessment: Needs assistance Sitting-balance support: Feet supported Sitting balance-Leahy Scale: Good     Standing balance support: During functional activity;Bilateral upper extremity supported Standing balance-Leahy Scale: Poor                               Pertinent Vitals/Pain Pain Assessment: Faces Faces Pain Scale: Hurts little more Pain Location: Rt knee Pain Descriptors / Indicators: Aching;Discomfort Pain Intervention(s): Limited activity within patient's tolerance;Premedicated before session;Monitored during session;Repositioned;Ice applied    Home Living Family/patient expects to be discharged to:: Private residence Living Arrangements: Spouse/significant other Available Help at Discharge: Family Type of Home: House Home Access: Stairs to enter Entrance Stairs-Rails: None Entrance Stairs-Number of Steps: 1 Home Layout: One level Home Equipment: Bedside commode;Walker - 2 wheels;Cane - single point Additional Comments: pt's husband is there and a friend can help as well    Prior Function Level of Independence: Independent               Hand Dominance  Extremity/Trunk Assessment   Upper Extremity Assessment Upper Extremity Assessment: Overall WFL for tasks assessed    Lower Extremity Assessment Lower Extremity Assessment: RLE deficits/detail RLE Deficits / Details: good  quad activation, no extensor lag with SLR, hesistant to perform but able to complete. RLE Sensation: WNL RLE Coordination: WNL    Cervical / Trunk Assessment Cervical / Trunk Assessment: Normal  Communication   Communication: No difficulties  Cognition Arousal/Alertness: Awake/alert Behavior During Therapy: WFL for tasks assessed/performed Overall Cognitive Status: Within Functional Limits for tasks assessed                                        General Comments      Exercises Total Joint Exercises Ankle Circles/Pumps: AROM;Both;20 reps;Seated Quad Sets: AROM;Right;10 reps;Seated   Assessment/Plan    PT Assessment Patient needs continued PT services  PT Problem List Decreased strength;Decreased range of motion;Decreased activity tolerance;Decreased balance;Decreased mobility;Decreased knowledge of use of DME;Decreased knowledge of precautions;Pain       PT Treatment Interventions DME instruction;Gait training;Stair training;Functional mobility training;Therapeutic activities;Therapeutic exercise;Balance training;Patient/family education    PT Goals (Current goals can be found in the Care Plan section)  Acute Rehab PT Goals Patient Stated Goal: recover and gly to [place] in May PT Goal Formulation: With patient Time For Goal Achievement: 06/09/20 Potential to Achieve Goals: Good    Frequency 7X/week   Barriers to discharge        Co-evaluation               AM-PAC PT "6 Clicks" Mobility  Outcome Measure Help needed turning from your back to your side while in a flat bed without using bedrails?: A Little Help needed moving from lying on your back to sitting on the side of a flat bed without using bedrails?: A Little Help needed moving to and from a bed to a chair (including a wheelchair)?: A Little Help needed standing up from a chair using your arms (e.g., wheelchair or bedside chair)?: A Little Help needed to walk in hospital room?: A  Little Help needed climbing 3-5 steps with a railing? : A Lot 6 Click Score: 17    End of Session         PT Visit Diagnosis: Muscle weakness (generalized) (M62.81);Difficulty in walking, not elsewhere classified (R26.2)    Time: 0301-3143 PT Time Calculation (min) (ACUTE ONLY): 28 min   Charges:   PT Evaluation $PT Eval Low Complexity: 1 Low PT Treatments $Gait Training: 8-22 mins        Verner Mould, DPT Acute Rehabilitation Services Office [phone] Pager [phone]    Jacques Navy 06/02/2020, 3:54 PM

## 2020-06-02 NOTE — Brief Op Note (Signed)
06/02/2020  8:51 AM  PATIENT:  Lynn Peters  65 y.o. female  PRE-OPERATIVE DIAGNOSIS:  right knee osteoarthritis  POST-OPERATIVE DIAGNOSIS:  right knee osteoarthritis  PROCEDURE:  Procedure(s) with comments: RIGHT TOTAL KNEE ARTHROPLASTY (Right) - Needs RNFA  SURGEON:  Surgeon(s) and Role:    Mcarthur Rossetti, MD - Primary  ANESTHESIA:   local, regional and spinal  EBL:  20 mL   COUNTS:  YES  TOURNIQUET:   Total Tourniquet Time Documented: Thigh (Right) - 48 minutes Total: Thigh (Right) - 48 minutes   DICTATION: .Other Dictation: Dictation Number [id number]  PLAN OF CARE: Admit for overnight observation  PATIENT DISPOSITION:  PACU - hemodynamically stable.   Delay start of Pharmacological VTE agent (>24hrs) due to surgical blood loss or risk of bleeding: no

## 2020-06-02 NOTE — Transfer of Care (Signed)
Immediate Anesthesia Transfer of Care Note  Patient: Lynn Peters  Procedure(s) Performed: RIGHT TOTAL KNEE ARTHROPLASTY (Right Knee)  Patient Location: PACU  Anesthesia Type:Spinal  Level of Consciousness: awake, alert  and patient cooperative  Airway & Oxygen Therapy: Patient Spontanous Breathing and Patient connected to face mask oxygen  Post-op Assessment: Report given to RN and Post -op Vital signs reviewed and stable  Post vital signs: Reviewed and stable  Last Vitals:  Vitals Value Taken Time  BP 119/71 06/02/20 0915  Temp    Pulse 91 06/02/20 0916  Resp 15 06/02/20 0916  SpO2 99 % 06/02/20 0916  Vitals shown include unvalidated device data.  Last Pain:  Vitals:   06/02/20 0556  TempSrc: Oral         Complications: No complications documented.

## 2020-06-02 NOTE — Anesthesia Procedure Notes (Signed)
Anesthesia Regional Block: Adductor canal block   Pre-Anesthetic Checklist: ,, timeout performed, Correct Patient, Correct Site, Correct Laterality, Correct Procedure, Correct Position, site marked, Risks and benefits discussed,  Surgical consent,  Pre-op evaluation,  At surgeon's request and post-op pain management  Laterality: Right  Prep: chloraprep       Needles:  Injection technique: Single-shot  Needle Type: Echogenic Needle     Needle Length: 10cm  Needle Gauge: 21     Additional Needles:   Narrative:  Start time: 06/02/2020 7:02 AM End time: 06/02/2020 7:05 AM Injection made incrementally with aspirations every 5 mL.  Performed by: Personally  Anesthesiologist: Audry Pili, MD  Additional Notes: No pain on injection. No increased resistance to injection. Injection made in 5cc increments. Good needle visualization. Patient tolerated the procedure well.

## 2020-06-02 NOTE — Op Note (Signed)
NAME: Lynn Peters, Lynn Peters MEDICAL RECORD NO: [id number] ACCOUNT NO: [id number] DATE OF BIRTH: 02-25-1955 FACILITY: [facility] LOCATION: [facility] PHYSICIAN: Lind Guest. Ninfa Linden, MD  Operative Report   DATE OF PROCEDURE: 06/02/2020  PREOPERATIVE DIAGNOSES: 1.  Primary osteoarthritis and degenerative joint disease, right knee. 2.  History of ACL reconstruction with retained interference screws, right knee.  POSTOPERATIVE DIAGNOSES: 1.  Primary osteoarthritis and degenerative joint disease, right knee. 2.  History of ACL reconstruction with retained interference screws, right knee.  PROCEDURE PERFORMED:   1.  Press-fit right total knee arthroplasty. 2.  Removal of interference screws (deep buried implants), right knee.  SURGEON:  Lind Guest. Ninfa Linden, MD  ANESTHESIA: 1.  Right lower extremity adductor canal block. 2.  Local with 0.25% Marcaine with epinephrine. 3.  Spinal.  ANTIBIOTICS:  2 g IV Ancef.  TOURNIQUET TIME:  Less than 1 hour.  BLOOD LOSS:  Less than 100 mL.  COMPLICATIONS:  None.  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  Her x-rays show significant arthritis throughout the knee.  She does have  intermittent swelling and pain and it is detrimentally affecting her mobility, her activities of daily living, her quality of life to the point she does wish to proceed with a total knee arthroplasty.  We had a long and thorough discussion about the  risks and benefits of the surgery including risk of acute blood loss anemia, nerve and vessel injury, fracture, infection, DVT, skin and soft tissue issues and implant failure.  We talked about our goals being decrease pain, improve mobility and overall  improve quality of life.  DESCRIPTION OF PROCEDURE:  After informed consent was obtained, appropriate right knee was marked.  An adductor canal block was  obtained in the right lower extremity in the holding room.  She was then brought to the operating room and sat up on the  operating table where spinal anesthesia was obtained.  She was laid in the supine position on the operating table.  Foley catheter was placed and a nonsterile tourniquet was placed around her upper right thigh.  Her right thigh, knee, leg, and ankle were  prepped and draped with DuraPrep and sterile drapes including a sterile stockinette.  A timeout was called and she was identified correct patient, correct right knee.  We then used Esmarch to wrap out the leg and the tourniquet was inflated to 300 mm of  pressure.  We then made a midline incision over the patella and carried this proximally and distally.  We dissected down the knee joint, carried out a medial parapatellar arthrotomy.  We did not find a significant joint effusion, but we did find  periarticular osteophytes and cartilage wear throughout all 3 compartments of the knee.  We removed the remaining remnants of the ACL, PCL, medial and lateral meniscus.  We were able to easily remove the tibia interference screw for the ACL  reconstruction.  With the knee in a flexed position, we used the extramedullary cutting guide for making our proximal tibia cut.  We made this correcting for a neutral slope and correcting for varus and valgus.  We made this also to take 9 mm off the  high side, we made that cut without difficulty.  We then went to the femur and used the intramedullary guide for making our distal femoral cut, setting this for a right knee at 5 degrees externally rotated  and 8 mm distal femoral cut.  We made that cut  without difficulty and brought the knee back down to full extension.  We cleaned more debris of the meniscus from the back of the knee and then placed a 9 mm extension block and achieved full extension.  We then went back to the femur and put our femoral  sizing guide based off the epicondylar axis.  Based off  of this, we chose a size 3 femur.  We placed a 4-in-1 cutting block for size 3 femur and made our anterior and posterior cuts, followed by our chamfer cuts.  We then made our femoral box cut.  We  could easily then see the femur interference screw for the ACL reconstruction and removed that screw without difficulty as well.  Attention was then turned back to the tibia.  We chose a size 4 tibial tray for coverage, setting the rotation off the  tibial tubercle and the femur.  We felt she had good quality bone, so we did our press-fit keel punch for this tibial tray.  With the size 4 tibial tray, we trialed a size 3 right femur and a 9 mm extension block.  We felt that the final block should be  an 11, but we were pleased with range of motion and stability, but we felt like she just needed a little bit more tightness.  We then made our patellar cut and drilled three holes for size 29 press-fit patellar button.  We then removed all  instrumentation from the knee and irrigated the knee with normal saline solution.  I dried the knee real well and then placed our Marcaine with epinephrine mixture around the arthrotomy.  We dried the knee once again and then with the knee in a flexed  position, we placed our real Stryker press-fit tibial tray size 4, followed by a real size 3 right press-fit femur.  We placed our real 11 mm fixed bearing polyethylene insert and press fit our size 29 patellar button.  I then put her through several  cycles of motion.  I was pleased with the motion and stability of the knee.  We then let the tourniquet down and hemostasis was obtained with electrocautery.  We closed our arthrotomy with interrupted #1 Vicryl suture followed by 0 Vicryl to close the  deep tissue and 2-0 Vicryl to close subcutaneous tissue.  The skin was reapproximated with staples.  A well-padded sterile dressing was applied.  She was taken to recovery room in stable condition with all final counts being correct.  No  complications  noted.   SHW D: 06/02/2020 8:50:37 am T: 06/02/2020 10:08:00 am  JOB: [id number]/ [id number]

## 2020-06-02 NOTE — Interval H&P Note (Signed)
History and Physical Interval Note: The patient understands fully that she is here today for a right total knee replacement to treat her knee osteoarthritis.  There has been no acute change or interval change in her medical status.  Please see recent H&P.  The risks and benefits of surgery been explained in detail and informed consent is obtained.  06/02/2020 6:57 AM  Lynn Peters  has presented today for surgery, with the diagnosis of right knee osteoarthritis.  The various methods of treatment have been discussed with the patient and family. After consideration of risks, benefits and other options for treatment, the patient has consented to  Procedure(s) with comments: RIGHT TOTAL KNEE ARTHROPLASTY (Right) - Needs RNFA as a surgical intervention.  The patient's history has been reviewed, patient examined, no change in status, stable for surgery.  I have reviewed the patient's chart and labs.  Questions were answered to the patient's satisfaction.     Mcarthur Rossetti

## 2020-06-02 NOTE — Anesthesia Procedure Notes (Signed)
Procedure Name: MAC Date/Time: 06/02/2020 7:18 AM Performed by: Eben Burow, CRNA Pre-anesthesia Checklist: Patient identified, Emergency Drugs available, Suction available, Patient being monitored and Timeout performed Oxygen Delivery Method: Simple face mask Placement Confirmation: positive ETCO2

## 2020-06-03 DIAGNOSIS — M1711 Unilateral primary osteoarthritis, right knee: Secondary | ICD-10-CM | POA: Diagnosis not present

## 2020-06-03 LAB — CBC
HCT: 37.5 % (ref 36.0–46.0)
Hemoglobin: 12.3 g/dL (ref 12.0–15.0)
MCH: 29.6 pg (ref 26.0–34.0)
MCHC: 32.8 g/dL (ref 30.0–36.0)
MCV: 90.1 fL (ref 80.0–100.0)
Platelets: 305 10*3/uL (ref 150–400)
RBC: 4.16 MIL/uL (ref 3.87–5.11)
RDW: 12.9 % (ref 11.5–15.5)
WBC: 18.2 10*3/uL — ABNORMAL HIGH (ref 4.0–10.5)
nRBC: 0 % (ref 0.0–0.2)

## 2020-06-03 LAB — BASIC METABOLIC PANEL
Anion gap: 8 (ref 5–15)
BUN: 7 mg/dL — ABNORMAL LOW (ref 8–23)
CO2: 22 mmol/L (ref 22–32)
Calcium: 8.4 mg/dL — ABNORMAL LOW (ref 8.9–10.3)
Chloride: 103 mmol/L (ref 98–111)
Creatinine, Ser: 0.78 mg/dL (ref 0.44–1.00)
GFR, Estimated: >60 mL/min (ref >60)
Glucose, Bld: 150 mg/dL — ABNORMAL HIGH (ref 70–99)
Potassium: 3.8 mmol/L (ref 3.5–5.1)
Sodium: 133 mmol/L — ABNORMAL LOW (ref 135–145)

## 2020-06-03 MED ORDER — ASPIRIN 81 MG PO CHEW: 81.0000 mg | CHEWABLE_TABLET | Freq: Two times a day (BID) | ORAL | 0 refills | Status: DC

## 2020-06-03 MED ORDER — METHOCARBAMOL 500 MG PO TABS: 500.0000 mg | ORAL_TABLET | Freq: Four times a day (QID) | ORAL | 1 refills | Status: DC | PRN

## 2020-06-03 MED ORDER — OXYCODONE HCL 5 MG PO TABS: 5.0000 mg | ORAL_TABLET | ORAL | 0 refills | Status: DC | PRN

## 2020-06-03 NOTE — Plan of Care (Signed)
Patient discharged, all care plans met ° °

## 2020-06-03 NOTE — Progress Notes (Signed)
Patient verbalized understanding of discharge instructions. States she is borrowing a Air traffic controller and commode from friend and doesn't need [place] to order her one.

## 2020-06-03 NOTE — Progress Notes (Signed)
Physical Therapy Treatment Patient Details Name: Lynn Peters MRN: [id number] DOB: 08-Jan-1956 Today's Date: 06/03/2020    History of Present Illness Patient is 65 y.o. female s/p Rt TKA on 06/02/20 with PMH significant for asthma, OA, anxiety, GERD.    PT Comments    Progressing with mobility. Reviewed/practiced gait and stair training. Able to safely ambulate and negotiate stairs without KI this afternoon. All education completed. Encouraged pt to refer questions to RN during discharge instructions. Also encouraged her to contact surgeons office if any issues arise or if she has any other medical questions. Okay to d/c from PT standpoint.     Follow Up Recommendations  Follow surgeon's recommendation for DC plan and follow-up therapies (HHPT)     Equipment Recommendations  None recommended by PT    Recommendations for Other Services       Precautions / Restrictions Precautions Precautions: Fall;Knee Restrictions Weight Bearing Restrictions: No Other Position/Activity Restrictions: WBAT    Mobility  Bed Mobility Overal bed mobility: Needs Assistance Bed Mobility: Supine to Sit          General bed mobility comments: oob in recliner    Transfers Overall transfer level: Needs assistance Equipment used: Rolling walker (2 wheeled) Transfers: Sit to/from Stand Sit to Stand: Supervision         General transfer comment: supv for safety  Ambulation/Gait Ambulation/Gait assistance: Min guard Gait Distance (Feet): 125 Feet Assistive device: Rolling walker (2 wheeled) Gait Pattern/deviations: Step-to pattern;Decreased weight shift to right;Decreased stance time - right     General Gait Details: Min guard for safety. Cues for safety, sequence, technique, increased WBing on R. No lightheadedness.   Stairs Stairs: Yes Stairs assistance: Min assist Stair Management: Step to pattern;Backwards;Forwards;With walker Number of Stairs: 4 General stair comments: Practiced  2 steps x 1 fwds with RW on top step (pt stated steps were close enough that she could just put RW at top of stairs)-able to complete but with difficulty. Pt then stated she would just go up backwards-practiced 2 steps x 1 backwards with RW-able to safely complete with minimal difficulty-therapist stabilized walker. Cues for safety.   Wheelchair Mobility    Modified Rankin (Stroke Patients Only)       Balance Overall balance assessment: Needs assistance         Standing balance support: Bilateral upper extremity supported Standing balance-Leahy Scale: Poor                              Cognition Arousal/Alertness: Awake/alert Behavior During Therapy: WFL for tasks assessed/performed Overall Cognitive Status: Within Functional Limits for tasks assessed                                        Exercises     General Comments        Pertinent Vitals/Pain Pain Assessment: 0-10 Pain Score: 7  Pain Location: R knee Pain Descriptors / Indicators: Sore;Discomfort Pain Intervention(s): Limited activity within patient's tolerance;Monitored during session;Repositioned    Home Living                      Prior Function            PT Goals (current goals can now be found in the care plan section) Progress towards PT goals: Progressing toward goals    Frequency  7X/week      PT Plan Current plan remains appropriate    Co-evaluation              AM-PAC PT "6 Clicks" Mobility   Outcome Measure  Help needed turning from your back to your side while in a flat bed without using bedrails?: A Little Help needed moving from lying on your back to sitting on the side of a flat bed without using bedrails?: A Little Help needed moving to and from a bed to a chair (including a wheelchair)?: A Little Help needed standing up from a chair using your arms (e.g., wheelchair or bedside chair)?: A Little Help needed to walk in hospital  room?: A Little Help needed climbing 3-5 steps with a railing? : A Little 6 Click Score: 18    End of Session Equipment Utilized During Treatment: Gait belt Activity Tolerance: Patient tolerated treatment well Patient left: in chair;with call bell/phone within reach Nurse Communication:  (NT to assist pt with getting dressed/belongings packed up) PT Visit Diagnosis: Other abnormalities of gait and mobility (R26.89);Pain Pain - Right/Left: Right Pain - part of body: Knee     Time: 1530-1541 PT Time Calculation (min) (ACUTE ONLY): 11 min  Charges:  $Gait Training: 8-22 mins $Therapeutic Exercise: 8-22 mins                         Doreatha Massed, PT Acute Rehabilitation  Office: [phone] Pager: [phone]

## 2020-06-03 NOTE — TOC Progression Note (Signed)
Transition of Care [facility]) - Progression Note    Patient Details  Name: Lynn Peters MRN: [id number] Date of Birth: 05/11/1955  [facility]) CM/SW Contact  Joaquin Courts, RN Phone Number: 06/03/2020, 11:46 AM  Clinical Narrative:    [facility] to provide home health therapy, reports has rolling walker and 3in1.   Expected Discharge Plan: [place] Barriers to Discharge: No Barriers Identified  Expected Discharge Plan and Services Expected Discharge Plan: [place]   Discharge Planning Services: CM Consult Post Acute Care Choice: [place] arrangements for the past 2 months: Single Family Home Expected Discharge Date: 06/03/20               DME Arranged: N/A DME Agency: NA       HH Arranged: PT HH Agency: [facility] (formerly [employer])     Representative spoke with at [place]: pre-arranged in md office   Social Determinants of Health ([place]) Interventions    Readmission Risk Interventions No flowsheet data found.

## 2020-06-03 NOTE — Discharge Summary (Signed)
Patient ID: Lynn Peters MRN: [id number] DOB/AGE: 1955-06-29 65 y.o.  Admit date: 06/02/2020 Discharge date: 06/03/2020  Admission Diagnoses:  Principal Problem:   Unilateral primary osteoarthritis, right knee Active Problems:   Status post right knee replacement   Discharge Diagnoses:  Same  Past Medical History:  Diagnosis Date  . Allergy   . Anxiety   . Arthritis   . Asthma   . Chest pain   . Chicken pox   . Complication of anesthesia    Difficulty breathing after GYN procedure  . Diverticulitis 2009  . GERD (gastroesophageal reflux disease)   . Pneumonia     Surgeries: Procedure(s): RIGHT TOTAL KNEE ARTHROPLASTY on 06/02/2020   Consultants:   Discharged Condition: Improved  Hospital Course: Lynn Peters is an 65 y.o. female who was admitted 06/02/2020 for operative treatment ofUnilateral primary osteoarthritis, right knee. Patient has severe unremitting pain that affects sleep, daily activities, and work/hobbies. After pre-op clearance the patient was taken to the operating room on 06/02/2020 and underwent  Procedure(s): RIGHT TOTAL KNEE ARTHROPLASTY.    Patient was given perioperative antibiotics:  Anti-infectives (From admission, onward)   Start     Dose/Rate Route Frequency Ordered Stop   06/02/20 1330  ceFAZolin (ANCEF) IVPB 1 g/50 mL premix        1 g 100 mL/hr over 30 Minutes Intravenous Every 6 hours 06/02/20 0927 06/02/20 2048   06/02/20 0600  ceFAZolin (ANCEF) IVPB 2g/100 mL premix        2 g 200 mL/hr over 30 Minutes Intravenous On call to O.R. 06/02/20 7412 06/02/20 0726       Patient was given sequential compression devices, early ambulation, and chemoprophylaxis to prevent DVT.  Patient benefited maximally from hospital stay and there were no complications.    Recent vital signs:  Patient Vitals for the past 24 hrs:  BP Temp Temp src Pulse Resp SpO2  06/03/20 0944 134/89 (!) 97.5 F (36.4 C) Oral 81 16 100 %  06/03/20 0610 124/66 98.2 F (36.8  C) Oral 82 20 100 %  06/03/20 0157 110/71 98.2 F (36.8 C) Oral 87 20 99 %  06/02/20 2213 111/73 97.9 F (36.6 C) Oral 80 20 100 %  06/02/20 1644 (!) 143/82 97.7 F (36.5 C) Oral 81 17 100 %  06/02/20 1224 126/80 97.6 F (36.4 C) Oral 78 18 100 %     Recent laboratory studies:  Recent Labs    06/03/20 0245  WBC 18.2*  HGB 12.3  HCT 37.5  PLT 305  NA 133*  K 3.8  CL 103  CO2 22  BUN 7*  CREATININE 0.78  GLUCOSE 150*  CALCIUM 8.4*     Discharge Medications:   Allergies as of 06/03/2020      Reactions   Pantoprazole Sodium Shortness Of Breath   Tetracyclines & Related    Breo Ellipta [fluticasone Furoate-vilanterol] Palpitations   Flagyl [metronidazole] Other (See Comments)   Neck pain, spasm      Medication List    TAKE these medications   albuterol 108 (90 Base) MCG/ACT inhaler Commonly known as: Ventolin HFA INHALE 2 PUFFS INTO THE LUNGS EVERY 4 (FOUR) HOURS AS NEEDED FOR WHEEZING OR SHORTNESS OF BREATH. What changed:   how much to take  how to take this  when to take this  reasons to take this  additional instructions   aspirin 81 MG chewable tablet Chew 1 tablet (81 mg total) by mouth 2 (two) times daily.   famotidine  20 MG tablet Commonly known as: PEPCID Take 1 tablet (20 mg total) by mouth daily.   ipratropium-albuterol 0.5-2.5 (3) MG/3ML Soln Commonly known as: DUONEB Take 3 mLs by nebulization every 8 (eight) hours as needed.   methocarbamol 500 MG tablet Commonly known as: ROBAXIN Take 1 tablet (500 mg total) by mouth every 6 (six) hours as needed for muscle spasms.   oxyCODONE 5 MG immediate release tablet Commonly known as: Oxy IR/ROXICODONE Take 1-2 tablets (5-10 mg total) by mouth every 4 (four) hours as needed for moderate pain (pain score 4-6).   PRESCRIPTION MEDICATION Inhale 2 puffs into the lungs in the morning and at bedtime. Flixotide Inhaler   SOLUBLE FIBER/PROBIOTICS PO Take 1 capsule by mouth daily.             Durable Medical Equipment  (From admission, onward)         Start     Ordered   06/02/20 1039  DME Walker rolling  Once       Question Answer Comment  Walker: With 5 Inch Wheels   Patient needs a walker to treat with the following condition Status post total right knee replacement      06/02/20 1038   06/02/20 1039  DME 3 n 1  Once        06/02/20 1038          Diagnostic Studies: DG Knee Right Port  Result Date: 06/02/2020 CLINICAL DATA:  Status post RIGHT knee arthroplasty. EXAM: PORTABLE RIGHT KNEE - 1-2 VIEW COMPARISON:  February 16, 2020 FINDINGS: Expected postoperative changes following RIGHT total knee arthroplasty. Skin staples over the midline of the anterior knee. Fluid and gas within the joint. No acute bone process. IMPRESSION: Expected postoperative changes following RIGHT total knee arthroplasty. Electronically Signed   By: Zetta Bills M.D.   On: 06/02/2020 10:27    Disposition: Discharge disposition: 01-Home or [place]    Mcarthur Rossetti, MD Follow up in 2 week(s).   Specialty: Orthopedic Surgery Contact information: [address] [place] [address] [phone]                Signed: Mcarthur Rossetti 06/03/2020, 11:41 AM

## 2020-06-03 NOTE — Discharge Instructions (Signed)

## 2020-06-03 NOTE — Progress Notes (Signed)
Subjective: 1 Day Post-Op Procedure(s) (LRB): RIGHT TOTAL KNEE ARTHROPLASTY (Right) Patient reports pain as moderate.    Objective: Vital signs in last 24 hours: Temp:  [97.5 F (36.4 C)-98.2 F (36.8 C)] 97.5 F (36.4 C) (04/23 0944) Pulse Rate:  [78-87] 81 (04/23 0944) Resp:  [16-20] 16 (04/23 0944) BP: (110-143)/(66-89) 134/89 (04/23 0944) SpO2:  [99 %-100 %] 100 % (04/23 0944)  Intake/Output from previous day: 04/22 0701 - 04/23 0700 In: 2800.3 [P.O.:360; I.V.:2140.3; IV Piggyback:300] Out: 2895 [Urine:2875; Blood:20] Intake/Output this shift: Total I/O In: 250 [P.O.:120; I.V.:80; IV Piggyback:50] Out: -   Recent Labs    06/03/20 0245  HGB 12.3   Recent Labs    06/03/20 0245  WBC 18.2*  RBC 4.16  HCT 37.5  PLT 305   Recent Labs    06/03/20 0245  NA 133*  K 3.8  CL 103  CO2 22  BUN 7*  CREATININE 0.78  GLUCOSE 150*  CALCIUM 8.4*   No results for input(s): LABPT, INR in the last 72 hours.  Sensation intact distally Intact pulses distally Dorsiflexion/Plantar flexion intact Incision: scant drainage No cellulitis present Compartment soft   Assessment/Plan: 1 Day Post-Op Procedure(s) (LRB): RIGHT TOTAL KNEE ARTHROPLASTY (Right) Up with therapy Discharge home with home health    Patient's anticipated LOS is less than 2 midnights, meeting these requirements: - Younger than 64 - Lives within 1 hour of care - Has a competent adult at home to recover with post-op recover - NO history of  - Chronic pain requiring opiods  - Diabetes  - Coronary Artery Disease  - Heart failure  - Heart attack  - Stroke  - DVT/VTE  - Cardiac arrhythmia  - Respiratory Failure/COPD  - Renal failure  - Anemia  - Advanced Liver disease       Mcarthur Rossetti 06/03/2020, 11:39 AM

## 2020-06-03 NOTE — Plan of Care (Signed)
Plan of care reviewed and discussed with the patient. 

## 2020-06-03 NOTE — Plan of Care (Signed)
  Problem: Activity: Goal: Ability to avoid complications of mobility impairment will improve Outcome: Progressing   Problem: Pain Management: Goal: Pain level will decrease with appropriate interventions Outcome: Progressing   Problem: Safety: Goal: Ability to remain free from injury will improve Outcome: Progressing   

## 2020-06-03 NOTE — Progress Notes (Signed)
Physical Therapy Treatment Patient Details Name: Lynn Peters MRN: [id number] DOB: Dec 06, 1955 Today's Date: 06/03/2020    History of Present Illness Patient is 65 y.o. female s/p Rt TKA on 06/02/20 with PMH significant for asthma, OA, anxiety, GERD.    PT Comments    Progressing with mobility. Plan is for d/c home later today if all continues to go well.    Follow Up Recommendations  Follow surgeon's recommendation for DC plan and follow-up therapies (HHPT)     Equipment Recommendations  None recommended by PT    Recommendations for Other Services       Precautions / Restrictions Precautions Precautions: Fall;Knee Restrictions Weight Bearing Restrictions: No Other Position/Activity Restrictions: WBAT    Mobility  Bed Mobility Overal bed mobility: Needs Assistance Bed Mobility: Supine to Sit     Supine to sit: Supervision     General bed mobility comments: for safety    Transfers Overall transfer level: Needs assistance Equipment used: Rolling walker (2 wheeled) Transfers: Sit to/from Stand Sit to Stand: Min guard         General transfer comment: Min guard for safety. Cues for safety, hand/LE placement  Ambulation/Gait Ambulation/Gait assistance: Min guard Gait Distance (Feet): 125 Feet Assistive device: Rolling walker (2 wheeled) Gait Pattern/deviations: Step-to pattern;Step-through pattern;Decreased stride length     General Gait Details: Min guard for safety. Cues for safety, sequence, technique. No lightheadedness.   Stairs             Wheelchair Mobility    Modified Rankin (Stroke Patients Only)       Balance Overall balance assessment: Needs assistance         Standing balance support: Bilateral upper extremity supported Standing balance-Leahy Scale: Poor                              Cognition Arousal/Alertness: Awake/alert Behavior During Therapy: WFL for tasks assessed/performed Overall Cognitive Status: Within  Functional Limits for tasks assessed                                        Exercises Total Joint Exercises Ankle Circles/Pumps: AROM;Both;10 reps Quad Sets: AROM;Both;10 reps Heel Slides: AAROM;Right;10 reps Hip ABduction/ADduction: AAROM;Right;10 reps Straight Leg Raises: AAROM;Right;10 reps Goniometric ROM: ~10-60 degrees    General Comments        Pertinent Vitals/Pain Pain Assessment: 0-10 Pain Score: 7  Pain Location: R knee Pain Descriptors / Indicators: Sore;Discomfort Pain Intervention(s): Limited activity within patient's tolerance;Monitored during session    Home Living                      Prior Function            PT Goals (current goals can now be found in the care plan section) Progress towards PT goals: Progressing toward goals    Frequency    7X/week      PT Plan Current plan remains appropriate    Co-evaluation              AM-PAC PT "6 Clicks" Mobility   Outcome Measure  Help needed turning from your back to your side while in a flat bed without using bedrails?: A Little Help needed moving from lying on your back to sitting on the side of a flat bed without using bedrails?: A Little Help  needed moving to and from a bed to a chair (including a wheelchair)?: A Little Help needed standing up from a chair using your arms (e.g., wheelchair or bedside chair)?: A Little Help needed to walk in hospital room?: A Little Help needed climbing 3-5 steps with a railing? : A Little 6 Click Score: 18    End of Session Equipment Utilized During Treatment: Gait belt Activity Tolerance: Patient tolerated treatment well Patient left: in chair;with call bell/phone within reach   PT Visit Diagnosis: Other abnormalities of gait and mobility (R26.89);Pain Pain - Right/Left: Right Pain - part of body: Knee     Time: 1100-1130 PT Time Calculation (min) (ACUTE ONLY): 30 min  Charges:  $Gait Training: 8-22 mins $Therapeutic  Exercise: 8-22 mins                        Doreatha Massed, PT Acute Rehabilitation  Office: [phone] Pager: [phone]

## 2020-06-05 ENCOUNTER — Encounter (HOSPITAL_COMMUNITY): Payer: Self-pay | Admitting: Orthopaedic Surgery

## 2020-06-07 ENCOUNTER — Telehealth: Payer: Self-pay | Admitting: Orthopaedic Surgery

## 2020-06-07 MED ORDER — OXYCODONE HCL 5 MG PO TABS: 5.0000 mg | ORAL_TABLET | Freq: Four times a day (QID) | ORAL | 0 refills | Status: DC | PRN

## 2020-06-07 NOTE — Telephone Encounter (Signed)
Please advise 

## 2020-06-07 NOTE — Telephone Encounter (Signed)
Patient called needing Rx refilled Oxycodone. Patient is almost out of her medication. Patient uses [facility]  Patient asked for a call when Rx is sent to the pharmacy.   The number to contact patient is [phone]

## 2020-06-08 ENCOUNTER — Telehealth: Payer: Self-pay | Admitting: Orthopaedic Surgery

## 2020-06-08 NOTE — Telephone Encounter (Signed)
Patient aware she can get these over the counter and last resort enema

## 2020-06-08 NOTE — Telephone Encounter (Signed)
Patient called asked if she can get a laxative called in for her because she have not voided her bowel in 4 days. Patient said she is still taking the stool softener.  Please see previous note.  The number to contact patient is [phone]

## 2020-06-15 ENCOUNTER — Ambulatory Visit (INDEPENDENT_AMBULATORY_CARE_PROVIDER_SITE_OTHER): Payer: Medicare Other | Admitting: Orthopaedic Surgery

## 2020-06-15 ENCOUNTER — Encounter: Payer: Self-pay | Admitting: Orthopaedic Surgery

## 2020-06-15 DIAGNOSIS — Z96651 Presence of right artificial knee joint: Secondary | ICD-10-CM

## 2020-06-15 MED ORDER — METHOCARBAMOL 500 MG PO TABS: 500.0000 mg | ORAL_TABLET | Freq: Four times a day (QID) | ORAL | 1 refills | Status: DC | PRN

## 2020-06-15 MED ORDER — OXYCODONE HCL 5 MG PO TABS: 5.0000 mg | ORAL_TABLET | Freq: Four times a day (QID) | ORAL | 0 refills | Status: DC | PRN

## 2020-06-15 NOTE — Progress Notes (Signed)
The patient will be 2 weeks tomorrow status post a right total knee arthroplasty.  She has had a harder time postoperative with getting her knee bending and moving.  She has been able to get full extension but flexion is only been to about 70 degrees.  Is stable I will see her to the staples removed and Steri-Strips applied.  Her calf is soft.  She is already stopped her twice a day baby aspirin so I did tell her to go back at once a day for a week.  Her calf is soft and there is no swelling of the foot and ankle.  Her motion is significant limited.  I did give her a prescription for outpatient physical therapy in [place] with [facility].  It is essential that she push her knee to get it bending and moving.  I did refill her pain medicine and will continue to do so.  All questions and concerns were answered and addressed.  We will see her back in 4 weeks to see how her knee is coming from an exam standpoint

## 2020-06-21 ENCOUNTER — Other Ambulatory Visit: Payer: Self-pay

## 2020-06-21 ENCOUNTER — Telehealth: Payer: Self-pay

## 2020-06-21 ENCOUNTER — Telehealth: Payer: Self-pay | Admitting: Orthopaedic Surgery

## 2020-06-21 DIAGNOSIS — Z96651 Presence of right artificial knee joint: Secondary | ICD-10-CM

## 2020-06-21 MED ORDER — OXYCODONE HCL 5 MG PO TABS: 5.0000 mg | ORAL_TABLET | Freq: Four times a day (QID) | ORAL | 0 refills | Status: DC | PRN

## 2020-06-21 NOTE — Telephone Encounter (Signed)
Send demographic information as well

## 2020-06-21 NOTE — Telephone Encounter (Signed)
Patient called requesting a refill of oxycodone. Please send to pharmacy on file. Patient phone number is [phone].

## 2020-06-21 NOTE — Telephone Encounter (Signed)
Sent order.

## 2020-06-21 NOTE — Telephone Encounter (Signed)
Lynn Peters from Lynn Peters pt in [place] called she is requesting a referral to be sent to their practice call [phone]

## 2020-06-23 ENCOUNTER — Ambulatory Visit: Payer: Medicare Other | Admitting: Internal Medicine

## 2020-06-29 ENCOUNTER — Telehealth: Payer: Self-pay

## 2020-06-29 MED ORDER — METHOCARBAMOL 500 MG PO TABS: 500.0000 mg | ORAL_TABLET | Freq: Four times a day (QID) | ORAL | 1 refills | Status: DC | PRN

## 2020-06-29 NOTE — Telephone Encounter (Signed)
Please advise 

## 2020-06-29 NOTE — Telephone Encounter (Signed)
Pt called and would like a refill on her methocarbamol

## 2020-07-06 ENCOUNTER — Telehealth: Payer: Self-pay

## 2020-07-06 ENCOUNTER — Other Ambulatory Visit: Payer: Self-pay | Admitting: Orthopaedic Surgery

## 2020-07-06 MED ORDER — OXYCODONE HCL 5 MG PO TABS: 5.0000 mg | ORAL_TABLET | Freq: Four times a day (QID) | ORAL | 0 refills | Status: DC | PRN

## 2020-07-06 NOTE — Telephone Encounter (Signed)
Pt called in and would like a refill of oxycodone sent into her pharm

## 2020-07-13 ENCOUNTER — Ambulatory Visit (INDEPENDENT_AMBULATORY_CARE_PROVIDER_SITE_OTHER): Payer: Medicare Other | Admitting: Orthopaedic Surgery

## 2020-07-13 ENCOUNTER — Encounter: Payer: Self-pay | Admitting: Orthopaedic Surgery

## 2020-07-13 DIAGNOSIS — Z96651 Presence of right artificial knee joint: Secondary | ICD-10-CM

## 2020-07-13 MED ORDER — MELOXICAM 15 MG PO TABS: 15.0000 mg | ORAL_TABLET | Freq: Every day | ORAL | 3 refills | Status: DC | PRN

## 2020-07-13 NOTE — Progress Notes (Signed)
The patient is now 6 weeks status post a right total knee arthroplasty.  She is doing great in terms of pushing herself through therapy and getting her mobility back.  On exam today her extension is almost full and her flexion is to 118 degrees.  The knee feels ligamentously stable.  There is moderate swelling to be expected.  Her calf is soft and her incision looks good.  I will try meloxicam as an anti-inflammatory for her.  She is weaned herself easily off of pain medications.  She has at least 2 more therapy sessions.  All questions and concerns were answered and addressed.  I will see her back in 3 months with an AP and lateral the right knee.  If there is issues before then she knows to let us know.

## 2020-09-06 ENCOUNTER — Other Ambulatory Visit: Payer: Self-pay

## 2020-09-06 ENCOUNTER — Ambulatory Visit (INDEPENDENT_AMBULATORY_CARE_PROVIDER_SITE_OTHER): Payer: Medicare Other | Admitting: Internal Medicine

## 2020-09-06 VITALS — BP 138/84 | HR 79 | Ht 65.0 in | Wt 138.0 lb

## 2020-09-06 DIAGNOSIS — L680 Hirsutism: Secondary | ICD-10-CM | POA: Diagnosis not present

## 2020-09-06 LAB — BASIC METABOLIC PANEL
BUN: 12 mg/dL (ref 6–23)
CO2: 26 mEq/L (ref 19–32)
Calcium: 9.5 mg/dL (ref 8.4–10.5)
Chloride: 103 mEq/L (ref 96–112)
Creatinine, Ser: 0.78 mg/dL (ref 0.40–1.20)
GFR: 79.66 mL/min (ref >60.00)
Glucose, Bld: 131 mg/dL — ABNORMAL HIGH (ref 70–99)
Potassium: 3.5 mEq/L (ref 3.5–5.1)
Sodium: 139 mEq/L (ref 135–145)

## 2020-09-06 NOTE — Progress Notes (Signed)
Name: Lynn Peters  MRN/ DOB: [id number], 07-Feb-1956    Age/ Sex: 65 y.o., female    PCP: Burnard Hawthorne, FNP   Reason for Endocrinology Evaluation: Hirsutism     Date of Initial Endocrinology Evaluation: 09/06/2020     HPI: [name] is a 65 y.o. female with a past medical history of erythrocytosis, and asthma. The patient presented for initial endocrinology clinic visit on 09/06/2020 for consultative assistance with her Hirsutism.   The patient has been referred for further evaluation of hirsutism, it is believed that hirsutism may have contributed to erythrocytosis which she is following up with hematology for.   Per hematology she has tested negative for JAK2 V617F mutations.  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 She has sleep apnea in 2019 which showed no significant obstruction or sleep apnea. She has been sent to endocrinology for further evaluation of any endocrine causes    She has noted excessive hair under the chin for the past 30 yrs, worsening in nature , no extra hair on chest/nipples Has noted weight loss  Denies testosterone use  Has a boyfriend who does not use testosterone, uses a cream for rosacea  Menarche at 12, periods regular . Has hx of multiple miscarriages in the past  Denies voice changes  Denies hair loss   S/P hysterectomy 2004 due to fibroids  Had electrolysis on the chin  Plucks daily   She is of middle [place] descent   No FH of PCOS   HISTORY:  Past Medical History:  Past Medical History:  Diagnosis Date   Allergy    Anxiety    Arthritis    Asthma    Chest pain    Chicken pox    Complication of anesthesia    Difficulty breathing after GYN procedure   Diverticulitis 2009   GERD (gastroesophageal reflux disease)    Pneumonia    Past Surgical History:  Past Surgical History:  Procedure Laterality Date   ABDOMINAL HYSTERECTOMY  2004   partial; has cervix and ovaries per patient; done for fibroids,  noncancerous   ANTERIOR CRUCIATE LIGAMENT REPAIR Right 2000   COLONOSCOPY WITH PROPOFOL N/A 04/28/2019   Procedure: COLONOSCOPY WITH PROPOFOL;  Surgeon: Lin Landsman, MD;  Location: [facility];  Service: Gastroenterology;  Laterality: N/A;   ESOPHAGOGASTRODUODENOSCOPY (EGD) WITH PROPOFOL N/A 04/28/2019   Procedure: ESOPHAGOGASTRODUODENOSCOPY (EGD) WITH PROPOFOL;  Surgeon: Lin Landsman, MD;  Location: [facility];  Service: Gastroenterology;  Laterality: N/A;   LAPAROSCOPY     TOTAL KNEE ARTHROPLASTY Right 06/02/2020   Procedure: RIGHT TOTAL KNEE ARTHROPLASTY;  Surgeon: Mcarthur Rossetti, MD;  Location: [facility];  Service: Orthopedics;  Laterality: Right;  Needs RNFA   UTERINE FIBROID SURGERY      Social History:  reports that she has never smoked. She has never used smokeless tobacco. She reports current alcohol use. She reports that she does not use drugs. Family History: family history includes Alcohol abuse in her brother; Alzheimer's disease in her mother; Asthma in her mother and sister; Diabetes in her father, mother, and sister; Heart disease in her brother and father; Hyperlipidemia in her father and mother; Hypertension in her mother; Stroke in her mother.   HOME MEDICATIONS: Allergies as of 09/06/2020       Reactions   Pantoprazole Sodium Shortness Of Breath   Tetracyclines & Related    Breo Ellipta [fluticasone Furoate-vilanterol] Palpitations   Flagyl [metronidazole] Other (See Comments)   Neck pain,  spasm        Medication List        Accurate as of September 06, 2020 12:49 PM. If you have any questions, ask your nurse or doctor.          albuterol 108 (90 Base) MCG/ACT inhaler Commonly known as: Ventolin HFA INHALE 2 PUFFS INTO THE LUNGS EVERY 4 (FOUR) HOURS AS NEEDED FOR WHEEZING OR SHORTNESS OF BREATH. What changed:  how much to take how to take this when to take this reasons to take this additional instructions   aspirin 81 MG chewable  tablet Chew 1 tablet (81 mg total) by mouth 2 (two) times daily.   famotidine 20 MG tablet Commonly known as: PEPCID Take 1 tablet (20 mg total) by mouth daily.   ipratropium-albuterol 0.5-2.5 (3) MG/3ML Soln Commonly known as: DUONEB Take 3 mLs by nebulization every 8 (eight) hours as needed.   meloxicam 15 MG tablet Commonly known as: MOBIC Take 1 tablet (15 mg total) by mouth daily as needed for pain.   methocarbamol 500 MG tablet Commonly known as: ROBAXIN Take 1 tablet (500 mg total) by mouth every 6 (six) hours as needed for muscle spasms.   oxyCODONE 5 MG immediate release tablet Commonly known as: Oxy IR/ROXICODONE Take 1-2 tablets (5-10 mg total) by mouth every 6 (six) hours as needed for moderate pain (pain score 4-6).   PRESCRIPTION MEDICATION Inhale 2 puffs into the lungs in the morning and at bedtime. Flixotide Inhaler   SOLUBLE FIBER/PROBIOTICS PO Take 1 capsule by mouth daily.          REVIEW OF SYSTEMS: A comprehensive ROS was conducted with the patient and is negative except as per HPI    OBJECTIVE:  VS: BP 138/84   Pulse 79   Ht '5\' 5"'$  (1.651 m)   Wt 138 lb (62.6 kg)   SpO2 99%   BMI 22.96 kg/m    Wt Readings from Last 3 Encounters:  06/02/20 149 lb 14.6 oz (68 kg)  05/24/20 150 lb (68 kg)  05/03/20 150 lb (68 kg)     EXAM: General: Pt appears well and is in NAD  Neck: General: Supple without adenopathy. Thyroid: Thyroid size normal.  No goiter or nodules appreciated.   Lungs: Clear with good BS bilat with no rales, rhonchi, or wheezes  Heart: Auscultation: RRR.  Abdomen: Normoactive bowel sounds, soft, nontender, without masses or organomegaly palpable  Extremities:  BL LE: No pretibial edema normal ROM and strength.  Skin: Hair: hair stubbles at the chin . Unable to measure ferriman-gallwey as the pt has plucked hair. But has normal hair on extremities, and abdomen  Skin Inspection: No rashes  Mental Status: Judgment, insight:  Intact Orientation: Oriented to time, place, and person Mood and affect: No depression, anxiety, or agitation     DATA REVIEWED:  Results for MIKEA, SANCLEMENTE (MRN [id number]) as of 09/08/2020 11:18  Ref. Range 09/06/2020 14:24  Sodium Latest Ref Range: 135 - 145 mEq/L 139  Potassium Latest Ref Range: 3.5 - 5.1 mEq/L 3.5  Chloride Latest Ref Range: 96 - 112 mEq/L 103  CO2 Latest Ref Range: 19 - 32 mEq/L 26  Glucose Latest Ref Range: 70 - 99 mg/dL 131 (H)  BUN Latest Ref Range: 6 - 23 mg/dL 12  Creatinine Latest Ref Range: 0.40 - 1.20 mg/dL 0.78  Calcium Latest Ref Range: 8.4 - 10.5 mg/dL 9.5  GFR Latest Ref Range: >60.00 mL/min 79.66  DHEA-SO4 Latest Ref Range: 9 -  118 mcg/dL 8 (L)   Testosterone-pending 17 hydroxyprogesterone-pending CT abdomen 04/07/2019  Adrenals/Urinary Tract: Both adrenal glands appear normal. The kidneys appear normal without evidence of urinary tract calculus, suspicious lesion or hydronephrosis. No bladder abnormalities are seen.  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  No excessive hair growth has been noted on the rest of the her body no other evidence of excess androgen. -So far DHEAS has come NOT elevated, DHEA-S levels decrease with aging -I am still waiting on testosterone and 17 hydroxyprogesterone -Patient will need to continue follow-up with hematology for monitoring and management of erythrocytosis    Follow-up pending lab results  Signed electronically by: Mack Guise, MD  [facility] Endocrinology  [facility] [address] [place], [place] [address] Phone: [phone] FAX: [phone]   CC: Burnard Hawthorne, FNP [address]  [place] [address] Phone: [phone] Fax: [phone]   Return to Endocrinology clinic as below: Future Appointments  Date Time Provider [place]  09/06/2020  1:40 PM Jahmarion Popoff, Melanie Crazier, MD [facility] None  10/12/2020  1:15 PM Mcarthur Rossetti, MD [facility] None  10/30/2020 12:45 PM [facility] None  10/30/2020  1:15 PM Earlie Server, MD [facility] None  11/03/2020  1:30 PM Arnett, Yvetta Coder, FNP [facility]

## 2020-09-06 NOTE — Patient Instructions (Signed)
-   PLease stop by the lab today

## 2020-09-07 ENCOUNTER — Encounter: Payer: Self-pay | Admitting: Internal Medicine

## 2020-09-08 ENCOUNTER — Encounter: Payer: Self-pay | Admitting: Internal Medicine

## 2020-09-08 DIAGNOSIS — E288 Other ovarian dysfunction: Secondary | ICD-10-CM | POA: Insufficient documentation

## 2020-09-08 DIAGNOSIS — L680 Hirsutism: Secondary | ICD-10-CM | POA: Insufficient documentation

## 2020-09-10 LAB — 17-HYDROXYPROGESTERONE: 17-OH-Progesterone, LC/MS/MS: 43 ng/dL

## 2020-09-10 LAB — DHEA-SULFATE: DHEA-SO4: 8 ug/dL — ABNORMAL LOW (ref 9–118)

## 2020-09-10 LAB — TESTOSTERONE, TOTAL, LC/MS/MS: Testosterone, Total, LC-MS-MS: 95 ng/dL — ABNORMAL HIGH (ref 2–45)

## 2020-09-11 ENCOUNTER — Telehealth: Payer: Self-pay | Admitting: Internal Medicine

## 2020-09-11 ENCOUNTER — Other Ambulatory Visit: Payer: Self-pay | Admitting: Internal Medicine

## 2020-09-11 DIAGNOSIS — E281 Androgen excess: Secondary | ICD-10-CM

## 2020-09-11 DIAGNOSIS — L680 Hirsutism: Secondary | ICD-10-CM

## 2020-09-11 DIAGNOSIS — R7989 Other specified abnormal findings of blood chemistry: Secondary | ICD-10-CM

## 2020-09-11 NOTE — Progress Notes (Signed)
Testosterone elevated at 95 ng/dL, will proceed with ovarian images.     Results for Lynn Peters, Lynn Peters (MRN [id number]) as of 09/11/2020 10:55  Ref. Range 09/06/2020 14:24  DHEA-SO4 Latest Ref Range: 9 - 118 mcg/dL 8 (L)  Glucose Latest Ref Range: 70 - 99 mg/dL 131 (H)  Testosterone, Total, LC-MS-MS Latest Ref Range: 2 - 45 ng/dL 95 (H)  17-OH-Progesterone, LC/MS/MS Latest Ref Range:  ng/dL 43

## 2020-09-11 NOTE — Telephone Encounter (Signed)
[facility] is needing to know if the trans vaginal is needed for the order that is put in for the [place] that is already put in

## 2020-09-12 ENCOUNTER — Encounter: Payer: Self-pay | Admitting: Family

## 2020-09-12 NOTE — Telephone Encounter (Signed)
Please advise 

## 2020-09-13 NOTE — Telephone Encounter (Signed)
[facility] called back to follow up. Advised ordering provider does want trans vaginal u/s. Order to be updates and sent for co sign by provider.

## 2020-09-14 ENCOUNTER — Ambulatory Visit
Admission: RE | Admit: 2020-09-14 | Discharge: 2020-09-14 | Disposition: A | Discharge status: Home / Self Care | Payer: Medicare Other | Source: Ambulatory Visit | Attending: Internal Medicine | Admitting: Internal Medicine

## 2020-09-14 ENCOUNTER — Other Ambulatory Visit: Payer: Self-pay

## 2020-09-14 DIAGNOSIS — R7989 Other specified abnormal findings of blood chemistry: Secondary | ICD-10-CM

## 2020-09-14 DIAGNOSIS — E281 Androgen excess: Secondary | ICD-10-CM

## 2020-09-18 ENCOUNTER — Telehealth: Payer: Self-pay | Admitting: Internal Medicine

## 2020-09-18 DIAGNOSIS — R768 Other specified abnormal immunological findings in serum: Secondary | ICD-10-CM

## 2020-09-18 DIAGNOSIS — R7989 Other specified abnormal findings of blood chemistry: Secondary | ICD-10-CM

## 2020-09-18 NOTE — Telephone Encounter (Signed)
Sent pt a my chart message regarding this

## 2020-09-18 NOTE — Telephone Encounter (Signed)
Please let the pt know that her pelvic ultrasound did not show any evidence of ovarian tumors, I have ordered a CT scan of the abdomen to look at her adrenal glands    Thank you   Abby Nena Jordan, MD  [facility] Endocrinology  [facility] [address] [place], [place] [address] Phone: [phone] FAX: [phone]

## 2020-09-21 ENCOUNTER — Other Ambulatory Visit: Payer: Self-pay

## 2020-09-21 ENCOUNTER — Ambulatory Visit
Admission: RE | Admit: 2020-09-21 | Discharge: 2020-09-21 | Disposition: A | Discharge status: Home / Self Care | Payer: Medicare Other | Source: Ambulatory Visit | Attending: Internal Medicine | Admitting: Internal Medicine

## 2020-09-21 DIAGNOSIS — R768 Other specified abnormal immunological findings in serum: Secondary | ICD-10-CM

## 2020-09-21 DIAGNOSIS — R7989 Other specified abnormal findings of blood chemistry: Secondary | ICD-10-CM

## 2020-09-21 MED ORDER — IOPAMIDOL (ISOVUE-300) INJECTION 61%
75.0000 mL | Freq: Once | INTRAVENOUS | Status: AC | PRN
  Administered 2020-09-21: 100 mL via INTRAVENOUS

## 2020-09-25 ENCOUNTER — Telehealth: Payer: Self-pay | Admitting: Internal Medicine

## 2020-09-25 NOTE — Telephone Encounter (Signed)
I have discussed the pelvic ultrasound results as well as CT abdomen results with the patient on 09/25/2020 at 12:45 PM    I explained to the patient that there is no evidence of ovarian or adrenal tumors  We did discuss that the literature differs between what is considered significant elevation of testosterone in postmenopausal woman, and some is over 100 and others its over 150 NG/DL  We discussed that most likely the source of her testosterone is the ovaries.  We discussed drastic changes to include oophorectomy but since there is no clinical evidence of androgen excess except for the localized area of hirsutism on her face we have opted to monitor at this time   I also explained to the patient that I cannot guarantee that testosterone level is the reason for erythrocytosis.   She will follow-up with me in 3 months  She will continue to follow-up with hematology   Pepin, MD  [facility]  [facility] [address] [place], [place] [address] Phone: [phone] FAX: [phone]

## 2020-10-12 ENCOUNTER — Ambulatory Visit: Payer: Medicare Other | Admitting: Orthopaedic Surgery

## 2020-10-24 ENCOUNTER — Encounter: Payer: Self-pay | Admitting: Family

## 2020-10-25 ENCOUNTER — Other Ambulatory Visit: Payer: Self-pay

## 2020-10-26 ENCOUNTER — Encounter: Payer: Self-pay | Admitting: Orthopaedic Surgery

## 2020-10-26 ENCOUNTER — Other Ambulatory Visit: Payer: Self-pay

## 2020-10-26 ENCOUNTER — Ambulatory Visit (INDEPENDENT_AMBULATORY_CARE_PROVIDER_SITE_OTHER): Payer: Medicare Other | Admitting: Orthopaedic Surgery

## 2020-10-26 ENCOUNTER — Ambulatory Visit (INDEPENDENT_AMBULATORY_CARE_PROVIDER_SITE_OTHER): Payer: Medicare Other

## 2020-10-26 DIAGNOSIS — Z96651 Presence of right artificial knee joint: Secondary | ICD-10-CM

## 2020-10-26 NOTE — Progress Notes (Signed)
The patient is now 5 months status post a right total knee arthroplasty.  She is a very active 65 year old female and has only occasional pain with her right operative knee.  We performed a knee replacement in April of this year with press-fit implants.  She is not take any medication for pain and walks every day.  She does not like the knee clicks and I explained to her that that is the mechanics of knee replacements and showed her knee model.  I think if she works on quad strengthening more and time this may click less.  On exam she has minimal knee swelling.  Her right knee exam shows excellent range of motion and is stable on my exam.  2 views of the right knee show well-seated press-fit knee implant with no complicating features.  She will continue her activities as she tolerates and work on Forensic scientist.  I would like to see her back for hopefully a final visit in 6 months with a repeat AP and lateral of her right operative knee.  All questions and concerns were answered and addressed.

## 2020-10-30 ENCOUNTER — Other Ambulatory Visit: Payer: Medicare Other

## 2020-10-30 ENCOUNTER — Ambulatory Visit: Payer: Medicare Other | Admitting: Oncology

## 2020-11-02 ENCOUNTER — Telehealth: Payer: Self-pay | Admitting: Family

## 2020-11-02 NOTE — Telephone Encounter (Signed)
Call patient She is due for mammogram, bone density.  Please schedule for her

## 2020-11-03 ENCOUNTER — Ambulatory Visit: Payer: Medicare Other | Admitting: Family

## 2020-11-03 NOTE — Telephone Encounter (Signed)
Mychart message sent to patient to see what day/time she would like me to schedule her for mammo & DEXA.

## 2020-11-06 ENCOUNTER — Other Ambulatory Visit: Payer: Self-pay

## 2020-11-06 ENCOUNTER — Encounter: Payer: Self-pay | Admitting: Family

## 2020-11-06 ENCOUNTER — Ambulatory Visit (INDEPENDENT_AMBULATORY_CARE_PROVIDER_SITE_OTHER): Payer: Medicare Other | Admitting: Family

## 2020-11-06 VITALS — BP 120/73 | HR 73 | Temp 97.9°F | Ht 65.0 in | Wt 147.0 lb

## 2020-11-06 DIAGNOSIS — K219 Gastro-esophageal reflux disease without esophagitis: Secondary | ICD-10-CM

## 2020-11-06 DIAGNOSIS — Z1231 Encounter for screening mammogram for malignant neoplasm of breast: Secondary | ICD-10-CM

## 2020-11-06 DIAGNOSIS — I700 Atherosclerosis of aorta: Secondary | ICD-10-CM | POA: Diagnosis not present

## 2020-11-06 DIAGNOSIS — J4530 Mild persistent asthma, uncomplicated: Secondary | ICD-10-CM | POA: Diagnosis not present

## 2020-11-06 MED ORDER — FLUTICASONE PROPIONATE HFA 110 MCG/ACT IN AERO: 2.0000 | INHALATION_SPRAY | Freq: Two times a day (BID) | RESPIRATORY_TRACT | 4 refills | Status: DC

## 2020-11-06 NOTE — Assessment & Plan Note (Signed)
Chronic, stable.  No alarm features at this time.  Continue Pepcid AC over-the-counter prn

## 2020-11-06 NOTE — Progress Notes (Signed)
Subjective:    Patient ID: Lynn Peters, female    DOB: 1955/10/19, 65 y.o.   MRN: [id number]  CC: Lynn Peters is a 65 y.o. female who presents today for follow up.   HPI: Feels well today No complaints  Fatigue has resolved.     Atherosclerosis seen on C a/p 09/2020. She is working on weight loss and eating healthier.     GERD- controlled with pepcid 20mg  prn. No pain or trouble swallowing. She doesn't eat late as she doesn't eat past 5pm.   Asthma- compliant with flovent bid. No wheezing, sob,   Following with endocrine for hirsutism evaluation.  Testosterone elevated 09/06/20.   Pelvic ultrasound, CT ab/p  performed 09/14/20.  Mammogram and bone density are scheduled   Erythrocytosis-follow-up with Dr. Tasia Catchings 11/14/2020   HISTORY:  Past Medical History:  Diagnosis Date   Allergy    Anxiety    Arthritis    Asthma    Chest pain    Chicken pox    Complication of anesthesia    Difficulty breathing after GYN procedure   Diverticulitis 2009   GERD (gastroesophageal reflux disease)    Pneumonia    Past Surgical History:  Procedure Laterality Date   ABDOMINAL HYSTERECTOMY  2004   partial; has cervix and ovaries per patient; done for fibroids, noncancerous   ANTERIOR CRUCIATE LIGAMENT REPAIR Right 2000   COLONOSCOPY WITH PROPOFOL N/A 04/28/2019   Procedure: COLONOSCOPY WITH PROPOFOL;  Surgeon: Lin Landsman, MD;  Location: [facility] ENDOSCOPY;  Service: Gastroenterology;  Laterality: N/A;   ESOPHAGOGASTRODUODENOSCOPY (EGD) WITH PROPOFOL N/A 04/28/2019   Procedure: ESOPHAGOGASTRODUODENOSCOPY (EGD) WITH PROPOFOL;  Surgeon: Lin Landsman, MD;  Location: [facility] ENDOSCOPY;  Service: Gastroenterology;  Laterality: N/A;   LAPAROSCOPY     TOTAL KNEE ARTHROPLASTY Right 06/02/2020   Procedure: RIGHT TOTAL KNEE ARTHROPLASTY;  Surgeon: Mcarthur Rossetti, MD;  Location: [facility];  Service: Orthopedics;  Laterality: Right;  Needs RNFA   UTERINE FIBROID SURGERY     Family History   Problem Relation Age of Onset   Alzheimer's disease Mother    Asthma Mother    Diabetes Mother    Hyperlipidemia Mother    Hypertension Mother    Stroke Mother    Heart disease Father        CHF   Diabetes Father    Hyperlipidemia Father    Alcohol abuse Brother    Heart disease Brother    Asthma Sister    Diabetes Sister    Breast cancer Neg Hx     Allergies: Pantoprazole sodium, Tetracyclines & related, Breo ellipta [fluticasone furoate-vilanterol], and Flagyl [metronidazole] Current Outpatient Medications on File Prior to Visit  Medication Sig Dispense Refill   albuterol (VENTOLIN HFA) 108 (90 Base) MCG/ACT inhaler INHALE 2 PUFFS INTO THE LUNGS EVERY 4 (FOUR) HOURS AS NEEDED FOR WHEEZING OR SHORTNESS OF BREATH. (Patient taking differently: Inhale 2 puffs into the lungs every 4 (four) hours as needed for shortness of breath or wheezing.) 18 g 2   Cholecalciferol (VITAMIN D3) 50 MCG (2000 UT) CAPS Take by mouth.     famotidine (PEPCID) 20 MG tablet Take 1 tablet (20 mg total) by mouth daily. 30 tablet 2   ipratropium-albuterol (DUONEB) 0.5-2.5 (3) MG/3ML SOLN Take 3 mLs by nebulization every 8 (eight) hours as needed. 360 mL 11   MAGNESIUM PO Take by mouth. 1,000 mg once a day     meloxicam (MOBIC) 15 MG tablet Take 1 tablet (15 mg total)  by mouth daily as needed for pain. 30 tablet 3   PRESCRIPTION MEDICATION Inhale 2 puffs into the lungs in the morning and at bedtime. Flixotide Inhaler     Probiotic Product (SOLUBLE FIBER/PROBIOTICS PO) Take 1 capsule by mouth daily.     TURMERIC PO Take by mouth. 1,000mg  once a day     vitamin B-12 (CYANOCOBALAMIN) 1000 MCG tablet Take 1,000 mcg by mouth daily.     aspirin 81 MG chewable tablet Chew 1 tablet (81 mg total) by mouth 2 (two) times daily. (Patient not taking: Reported on 11/06/2020) 30 tablet 0   methocarbamol (ROBAXIN) 500 MG tablet Take 1 tablet (500 mg total) by mouth every 6 (six) hours as needed for muscle spasms. (Patient not  taking: Reported on 11/06/2020) 40 tablet 1   Current Facility-Administered Medications on File Prior to Visit  Medication Dose Route Frequency Provider Last Rate Last Admin   ipratropium-albuterol (DUONEB) 0.5-2.5 (3) MG/3ML nebulizer solution 3 mL  3 mL Nebulization Q6H Eldor Conaway, Yvetta Coder, FNP        Social History   Tobacco Use   Smoking status: Never   Smokeless tobacco: Never  Vaping Use   Vaping Use: Never used  Substance Use Topics   Alcohol use: Yes    Alcohol/week: 0.0 standard drinks    Comment: rare   Drug use: No    Review of Systems  Constitutional:  Negative for chills and fever.  Respiratory:  Negative for cough.   Cardiovascular:  Negative for chest pain and palpitations.  Gastrointestinal:  Negative for nausea and vomiting.     Objective:    BP 120/73 (BP Location: Left Arm, Patient Position: Sitting, Cuff Size: Normal)   Pulse 73   Temp 97.9 F (36.6 C) (Oral)   Ht 5\' 5"  (1.651 m)   Wt 147 lb (66.7 kg)   SpO2 98%   BMI 24.46 kg/m  BP Readings from Last 3 Encounters:  11/06/20 120/73  09/06/20 138/84  06/03/20 129/74   Wt Readings from Last 3 Encounters:  11/06/20 147 lb (66.7 kg)  09/06/20 138 lb (62.6 kg)  06/02/20 149 lb 14.6 oz (68 kg)    Physical Exam Vitals reviewed.  Constitutional:      Appearance: She is well-developed.  Eyes:     Conjunctiva/sclera: Conjunctivae normal.  Cardiovascular:     Rate and Rhythm: Normal rate and regular rhythm.     Pulses: Normal pulses.     Heart sounds: Normal heart sounds.  Pulmonary:     Effort: Pulmonary effort is normal.     Breath sounds: Normal breath sounds. No wheezing, rhonchi or rales.  Skin:    General: Skin is warm and dry.  Neurological:     Mental Status: She is alert.  Psychiatric:        Speech: Speech normal.        Behavior: Behavior normal.        Thought Content: Thought content normal.       Assessment & Plan:   Problem List Items Addressed This Visit        Cardiovascular and Mediastinum   Atherosclerosis of aorta [facility])    Long discussion as it relates to known atherosclerosis, family history, and my recommendation  to start low-dose Crestor to decrease risk of ASCVD.  Patient politely declines and states that she would like to focus on lifestyle modifications.  We will recheck lipid panel in the new year.  Respiratory   Mild persistent asthma - Primary    Chronic, stable.  Continue Flovent daily.  Continue albuterol prn      Relevant Medications   fluticasone (FLOVENT HFA) 110 MCG/ACT inhaler     Digestive   GERD (gastroesophageal reflux disease)    Chronic, stable.  No alarm features at this time.  Continue Pepcid AC over-the-counter prn        I have discontinued Abbeygail Eddinger's oxyCODONE. I am also having her start on fluticasone. Additionally, I am having her maintain her Probiotic Product (SOLUBLE FIBER/PROBIOTICS PO), ipratropium-albuterol, famotidine, albuterol, PRESCRIPTION MEDICATION, aspirin, methocarbamol, meloxicam, vitamin D3, MAGNESIUM PO, vitamin B-12, and TURMERIC PO. We will continue to administer ipratropium-albuterol.   Meds ordered this encounter  Medications   fluticasone (FLOVENT HFA) 110 MCG/ACT inhaler    Sig: Inhale 2 puffs into the lungs 2 (two) times daily.    Dispense:  1 each    Refill:  4    Order Specific Question:   Supervising Provider    Answer:   Crecencio Mc [[id number]]     Return precautions given.   Risks, benefits, and alternatives of the medications and treatment plan prescribed today were discussed, and patient expressed understanding.   Education regarding symptom management and diagnosis given to patient on AVS.  Continue to follow with Burnard Hawthorne, FNP for routine health maintenance.   Lavena Bullion and I agreed with plan.   Mable Paris, FNP

## 2020-11-06 NOTE — Assessment & Plan Note (Signed)
Long discussion as it relates to known atherosclerosis, family history, and my recommendation  to start low-dose Crestor to decrease risk of ASCVD.  Patient politely declines and states that she would like to focus on lifestyle modifications.  We will recheck lipid panel in the new year.

## 2020-11-06 NOTE — Patient Instructions (Addendum)
Always so nice  to see you!

## 2020-11-06 NOTE — Telephone Encounter (Signed)
Patient scheduled and aware

## 2020-11-06 NOTE — Assessment & Plan Note (Signed)
Chronic, stable.  Continue Flovent daily.  Continue albuterol prn

## 2020-11-14 ENCOUNTER — Other Ambulatory Visit: Payer: Self-pay

## 2020-11-14 ENCOUNTER — Encounter: Payer: Self-pay | Admitting: Oncology

## 2020-11-14 ENCOUNTER — Inpatient Hospital Stay: Payer: Medicare Other | Attending: Oncology

## 2020-11-14 ENCOUNTER — Inpatient Hospital Stay (HOSPITAL_BASED_OUTPATIENT_CLINIC_OR_DEPARTMENT_OTHER): Payer: Medicare Other | Admitting: Oncology

## 2020-11-14 VITALS — BP 140/84 | HR 75 | Temp 97.8°F | Resp 20 | Wt 145.6 lb

## 2020-11-14 DIAGNOSIS — L680 Hirsutism: Secondary | ICD-10-CM | POA: Diagnosis not present

## 2020-11-14 DIAGNOSIS — D751 Secondary polycythemia: Secondary | ICD-10-CM | POA: Diagnosis not present

## 2020-11-14 DIAGNOSIS — Z96651 Presence of right artificial knee joint: Secondary | ICD-10-CM | POA: Diagnosis not present

## 2020-11-14 LAB — CBC WITH DIFFERENTIAL/PLATELET
Abs Immature Granulocytes: 0.01 10*3/uL (ref 0.00–0.07)
Basophils Absolute: 0.1 10*3/uL (ref 0.0–0.1)
Basophils Relative: 1 %
Eosinophils Absolute: 0.1 10*3/uL (ref 0.0–0.5)
Eosinophils Relative: 1 %
HCT: 45.9 % (ref 36.0–46.0)
Hemoglobin: 14.8 g/dL (ref 12.0–15.0)
Immature Granulocytes: 0 %
Lymphocytes Relative: 36 %
Lymphs Abs: 2.3 10*3/uL (ref 0.7–4.0)
MCH: 28 pg (ref 26.0–34.0)
MCHC: 32.2 g/dL (ref 30.0–36.0)
MCV: 86.9 fL (ref 80.0–100.0)
Monocytes Absolute: 0.6 10*3/uL (ref 0.1–1.0)
Monocytes Relative: 9 %
Neutro Abs: 3.4 10*3/uL (ref 1.7–7.7)
Neutrophils Relative %: 53 %
Platelets: 340 10*3/uL (ref 150–400)
RBC: 5.28 MIL/uL — ABNORMAL HIGH (ref 3.87–5.11)
RDW: 14 % (ref 11.5–15.5)
WBC: 6.4 10*3/uL (ref 4.0–10.5)
nRBC: 0 % (ref 0.0–0.2)

## 2020-11-14 NOTE — Progress Notes (Signed)
Hematology/Oncology follow up note [facility] [phone] [phone]   Patient Care Team: Burnard Hawthorne, FNP as PCP - General (Family Medicine)  REFERRING PROVIDER: Burnard Hawthorne, FNP  CHIEF COMPLAINTS/REASON FOR VISIT:  Follow up  polycytosis/erythrocytosis  HISTORY OF PRESENTING ILLNESS:  Lynn Peters is a 65 y.o. female who was seen in consultation at the request of Burnard Hawthorne, FNP for evaluation of polycytosis/erythrocytosis Reviewed patient's recent lab work which was obtain by PCP.  04/07/2019 labs showed elevated hemoglobin at 15.7,  total white count 7.6, platelet counts 354,000. Erythrocytosis, ischronic Onset, duration since at least 2017 at that time with a hemoglobin of 16. No aggravating or alleviating factors.   Associated signs or symptoms: Denies weight loss, fever, chills, fatigue, night sweats.   Context:  Smoking history: Never smoker.  She denies alcohol use.  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 History of blood clots: Denies Daytime somnolence: Denies Family history of polycythemia: Denies  Reports chronic abdominal pain.  Sometimes she has nonbloody diarrhea.  Patient was recently seen by gastroenterology Dr. Marius Ditch.  GI recommends stool studies to rule out infection, perform H. pylori breath test, recommend EGD and colonoscopy for further evaluation. Patient is a vegan   INTERVAL HISTORY Lynn Peters is a 65 y.o. female who has above history reviewed by me today presents for follow up visit for erythrocytosis Problems and complaints are listed below: Patient reports feeling well.   April 2022 she had right knee replacement.  She established care with endocrinology for hirsutism. She has increased testosterone level.  09/14/2020 US pelvic showed normal appearing ovaries bilaterally 09/21/2020 CT abdomen pelvis w/wo contrast showed  no adrenal mass or ovarian mass. She was  recommended to followup in 3 months.  She starts drinking spearmint tea, has changed her diet and lost 5 pounds. Hirsutism has improved.  She previously used inhaler she buys [place] and she is concerned about bad metering and increased dose of steroid she inhaled.   Review of Systems  Constitutional:  Negative for appetite change, chills, fatigue and fever.  HENT:   Negative for hearing loss and voice change.   Eyes:  Negative for eye problems.  Respiratory:  Negative for chest tightness and cough.   Cardiovascular:  Negative for chest pain.  Gastrointestinal:  Negative for abdominal distention, abdominal pain and blood in stool.  Endocrine: Negative for hot flashes.  Genitourinary:  Negative for difficulty urinating and frequency.   Musculoskeletal:  Negative for arthralgias.  Skin:  Negative for itching and rash.  Neurological:  Negative for extremity weakness.  Hematological:  Negative for adenopathy.  Psychiatric/Behavioral:  Negative for confusion.    MEDICAL HISTORY:  Past Medical History:  Diagnosis Date   Allergy    Anxiety    Arthritis    Asthma    Chest pain    Chicken pox    Complication of anesthesia    Difficulty breathing after GYN procedure   Diverticulitis 2009   GERD (gastroesophageal reflux disease)    Pneumonia     SURGICAL HISTORY: Past Surgical History:  Procedure Laterality Date   ABDOMINAL HYSTERECTOMY  2004   partial; has cervix and ovaries per patient; done for fibroids, noncancerous   ANTERIOR CRUCIATE LIGAMENT REPAIR Right 2000   COLONOSCOPY WITH PROPOFOL N/A 04/28/2019   Procedure: COLONOSCOPY WITH PROPOFOL;  Surgeon: Lin Landsman, MD;  Location: [facility] ENDOSCOPY;  Service: Gastroenterology;  Laterality: N/A;   ESOPHAGOGASTRODUODENOSCOPY (  EGD) WITH PROPOFOL N/A 04/28/2019   Procedure: ESOPHAGOGASTRODUODENOSCOPY (EGD) WITH PROPOFOL;  Surgeon: Lin Landsman, MD;  Location: [place];  Service: Gastroenterology;  Laterality: N/A;    LAPAROSCOPY     TOTAL KNEE ARTHROPLASTY Right 06/02/2020   Procedure: RIGHT TOTAL KNEE ARTHROPLASTY;  Surgeon: Mcarthur Rossetti, MD;  Location: [facility];  Service: Orthopedics;  Laterality: Right;  Needs RNFA   UTERINE FIBROID SURGERY      SOCIAL HISTORY: Social History   Socioeconomic History   Marital status: Married    Spouse name: Not on file   Number of children: Not on file   Years of education: Not on file   Highest education level: Not on file  Occupational History   Not on file  Tobacco Use   Smoking status: Never   Smokeless tobacco: Never  Vaping Use   Vaping Use: Never used  Substance and Sexual Activity   Alcohol use: Yes    Alcohol/week: 0.0 standard drinks    Comment: rare   Drug use: No   Sexual activity: Yes    Partners: Male    Birth control/protection: Surgical  Other Topics Concern   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      Diet- gym, knee limits running, paddleboating. Interval training 3x per week.       Diet-regular      Social Determinants of Radio broadcast assistant Strain: Not on file  Food Insecurity: Not on file  Transportation Needs: Not on file  Physical Activity: Not on file  Stress: Not on file  Social Connections: Not on file  Intimate Partner Violence: Not on file    FAMILY HISTORY: Family History  Problem Relation Age of Onset   Alzheimer's disease Mother    Asthma Mother    Diabetes Mother    Hyperlipidemia Mother    Hypertension Mother    Stroke Mother    Heart disease Father        CHF   Diabetes Father    Hyperlipidemia Father    Alcohol abuse Brother    Heart disease Brother    Asthma Sister    Diabetes Sister    Breast cancer Neg Hx     ALLERGIES:  is allergic to pantoprazole sodium, tetracyclines & related, breo ellipta [fluticasone furoate-vilanterol], and flagyl [metronidazole].  MEDICATIONS:   Current Outpatient Medications  Medication Sig Dispense Refill   Cholecalciferol (VITAMIN D3) 50 MCG (2000 UT) CAPS Take by mouth.     famotidine (PEPCID) 20 MG tablet Take 1 tablet (20 mg total) by mouth daily. 30 tablet 2   fluticasone (FLOVENT HFA) 110 MCG/ACT inhaler Inhale 2 puffs into the lungs 2 (two) times daily. 1 each 4   ipratropium-albuterol (DUONEB) 0.5-2.5 (3) MG/3ML SOLN Take 3 mLs by nebulization every 8 (eight) hours as needed. 360 mL 11   MAGNESIUM PO Take by mouth. 1,000 mg once a day     PRESCRIPTION MEDICATION Inhale 2 puffs into the lungs in the morning and at bedtime. Flixotide Inhaler     Probiotic Product (SOLUBLE FIBER/PROBIOTICS PO) Take 1 capsule by mouth daily.     TURMERIC PO Take by mouth. 1,000mg  once a day     vitamin B-12 (CYANOCOBALAMIN) 1000 MCG tablet Take 1,000 mcg by mouth daily.     albuterol (VENTOLIN HFA) 108 (90 Base) MCG/ACT  inhaler INHALE 2 PUFFS INTO THE LUNGS EVERY 4 (FOUR) HOURS AS NEEDED FOR WHEEZING OR SHORTNESS OF BREATH. (Patient taking differently: Inhale 2 puffs into the lungs every 4 (four) hours as needed for shortness of breath or wheezing.) 18 g 2   aspirin 81 MG chewable tablet Chew 1 tablet (81 mg total) by mouth 2 (two) times daily. (Patient not taking: Reported on 11/06/2020) 30 tablet 0   meloxicam (MOBIC) 15 MG tablet Take 1 tablet (15 mg total) by mouth daily as needed for pain. (Patient not taking: Reported on 11/14/2020) 30 tablet 3   methocarbamol (ROBAXIN) 500 MG tablet Take 1 tablet (500 mg total) by mouth every 6 (six) hours as needed for muscle spasms. (Patient not taking: No sig reported) 40 tablet 1   Current Facility-Administered Medications  Medication Dose Route Frequency Provider Last Rate Last Admin   ipratropium-albuterol (DUONEB) 0.5-2.5 (3) MG/3ML nebulizer solution 3 mL  3 mL Nebulization Q6H Burnard Hawthorne, FNP         PHYSICAL EXAMINATION: ECOG PERFORMANCE STATUS: 1 - Symptomatic but completely  ambulatory Vitals:   11/14/20 1338  BP: 140/84  Pulse: 75  Resp: 20  Temp: 97.8 F (36.6 C)  SpO2: 100%   Filed Weights   11/14/20 1338  Weight: 145 lb 9.6 oz (66 kg)    Physical Exam Constitutional:      General: She is not in acute distress. HENT:     Head: Normocephalic and atraumatic.  Eyes:     General: No scleral icterus. Cardiovascular:     Rate and Rhythm: Normal rate and regular rhythm.     Heart sounds: Normal heart sounds.  Pulmonary:     Effort: Pulmonary effort is normal. No respiratory distress.     Breath sounds: No wheezing.  Abdominal:     General: Bowel sounds are normal. There is no distension.     Palpations: Abdomen is soft.  Musculoskeletal:        General: No deformity. Normal range of motion.     Cervical back: Normal range of motion and neck supple.  Skin:    General: Skin is warm and dry.     Findings: No erythema or rash.  Neurological:     Mental Status: She is alert and oriented to person, place, and time. Mental status is at baseline.     Cranial Nerves: No cranial nerve deficit.     Coordination: Coordination normal.  Psychiatric:        Mood and Affect: Mood normal.    RADIOGRAPHIC STUDIES: I have personally reviewed the radiological images as listed and agreed with the findings in the report. XR Knee 1-2 Views Right  Result Date: 10/26/2020 2 views of the right knee show well-seated total knee arthroplasty with no complicating features.    LABORATORY DATA:  I have reviewed the data as listed Lab Results  Component Value Date   WBC 6.4 11/14/2020   HGB 14.8 11/14/2020   HCT 45.9 11/14/2020   MCV 86.9 11/14/2020   PLT 340 11/14/2020   Recent Labs    05/03/20 1424 05/24/20 1039 06/03/20 0245 09/06/20 1424  NA 142 140 133* 139  K 4.1 3.8 3.8 3.5  CL 103 106 103 103  CO2 28 26 22 26   GLUCOSE 90 89 150* 131*  BUN 6 9 7* 12  CREATININE 0.71 0.80 0.78 0.78  CALCIUM 9.7 9.6 8.4* 9.5  GFRNONAA  --  >60 >60  --   PROT  7.6  --   --   --   ALBUMIN 4.6  --   --   --   AST 20  --   --   --   ALT 16  --   --   --   ALKPHOS 69  --   --   --   BILITOT 0.6  --   --   --    Iron/TIBC/Ferritin/ %Sat No results found for: IRON, TIBC, FERRITIN, IRONPCTSAT      ASSESSMENT & PLAN:  1. Erythrocytosis   2. Hirsutism    Her previous work-up is negative JAK2 V617F mutation negative, with reflex to other mutations CALR, MPL, JAK 2 Ex 12-15 mutations negative.  Erythrocytosis was considered most likely secondary to secondary etiology. Previous sleep apnea in 2019 showed no significant obstruction or central sleep disordered breathing.  Lung study history of asthma, per pulmonology Dr. Patsey Berthold, not explaining erythrocytosis. Patient deferred familial erythrocytosis work-up [ send out test] due to concern of insurance coverage.  Labs are reviewed and discussed with patient. Hemoglobin and hematocrit have both improved. No need for phlebotomy   Hirsutism, increased testosterone level. She follows up with endocrinology. .   We spent sufficient time to discuss many aspect of care, questions were answered to patient's satisfaction. The patient knows to call the clinic with any problems questions or concerns.  Cc Burnard Hawthorne, FNP  Return of visit: 6 months   Earlie Server, MD, PhD 11/14/2020

## 2020-11-17 ENCOUNTER — Encounter: Payer: Self-pay | Admitting: Oncology

## 2020-11-28 ENCOUNTER — Other Ambulatory Visit: Payer: Medicare Other

## 2020-12-07 ENCOUNTER — Encounter: Payer: Self-pay | Admitting: Orthopaedic Surgery

## 2020-12-27 ENCOUNTER — Other Ambulatory Visit: Payer: Self-pay

## 2020-12-27 ENCOUNTER — Ambulatory Visit
Admission: RE | Admit: 2020-12-27 | Discharge: 2020-12-27 | Disposition: A | Discharge status: Home / Self Care | Payer: Medicare Other | Source: Ambulatory Visit | Attending: Family | Admitting: Family

## 2020-12-27 DIAGNOSIS — Z13820 Encounter for screening for osteoporosis: Secondary | ICD-10-CM | POA: Diagnosis present

## 2020-12-27 DIAGNOSIS — Z1231 Encounter for screening mammogram for malignant neoplasm of breast: Secondary | ICD-10-CM | POA: Insufficient documentation

## 2020-12-27 DIAGNOSIS — Z780 Asymptomatic menopausal state: Secondary | ICD-10-CM | POA: Diagnosis present

## 2020-12-31 ENCOUNTER — Encounter: Payer: Self-pay | Admitting: Family

## 2021-01-01 ENCOUNTER — Encounter: Payer: Self-pay | Admitting: Family

## 2021-01-01 ENCOUNTER — Ambulatory Visit (INDEPENDENT_AMBULATORY_CARE_PROVIDER_SITE_OTHER): Payer: Medicare Other

## 2021-01-01 ENCOUNTER — Other Ambulatory Visit: Payer: Self-pay

## 2021-01-01 ENCOUNTER — Ambulatory Visit (INDEPENDENT_AMBULATORY_CARE_PROVIDER_SITE_OTHER): Payer: Medicare Other | Admitting: Family

## 2021-01-01 VITALS — BP 112/82 | HR 79 | Temp 97.6°F | Ht 65.0 in | Wt 147.0 lb

## 2021-01-01 DIAGNOSIS — M545 Low back pain, unspecified: Secondary | ICD-10-CM | POA: Insufficient documentation

## 2021-01-01 DIAGNOSIS — M25473 Effusion, unspecified ankle: Secondary | ICD-10-CM | POA: Insufficient documentation

## 2021-01-01 DIAGNOSIS — M25472 Effusion, left ankle: Secondary | ICD-10-CM

## 2021-01-01 DIAGNOSIS — G8929 Other chronic pain: Secondary | ICD-10-CM

## 2021-01-01 DIAGNOSIS — M5442 Lumbago with sciatica, left side: Secondary | ICD-10-CM | POA: Diagnosis not present

## 2021-01-01 NOTE — Assessment & Plan Note (Signed)
Chronic, worsening. Radicular symptoms present.   Pending baseline x-ray to evaluate for degree of stenosis.  Discussed consult with orthopedics. Will follow.

## 2021-01-01 NOTE — Progress Notes (Signed)
Subjective:    Patient ID: Lynn Peters, female    DOB: Jan 08, 1956, 65 y.o.   MRN: [id number]  CC: Lynn Peters is a 65 y.o. female who presents today for an acute visit.    HPI: Left ankle swelling, bruising after  'twisting' 3 days ago.  Walking down her paved driveway and went over uneven surface off pavement into the grass.  She heard popping sound.   Swelling is worse after periods standing, walking.  Swelling improves with elevation. Pain is better. She is taking advil  200mg  BID with relief. Very rare use of mobic. She has been able to bear weight  No fever, sob, calf swelling or pain, red streaks.   Complains of low back pain, 20 years. Worsening in the past 2 weeks. She has tried PT in the past. No saddle anesthesia, trouble urinating or having bowel movement, groin pain, weakness. She has lateral left leg from buttocks to left knee. Mobic with relief.   H/o osteopenia  HISTORY:  Past Medical History:  Diagnosis Date   Allergy    Anxiety    Arthritis    Asthma    Chest pain    Chicken pox    Complication of anesthesia    Difficulty breathing after GYN procedure   Diverticulitis 2009   GERD (gastroesophageal reflux disease)    Pneumonia    Past Surgical History:  Procedure Laterality Date   ABDOMINAL HYSTERECTOMY  2004   partial; has cervix and ovaries per patient; done for fibroids, noncancerous   ANTERIOR CRUCIATE LIGAMENT REPAIR Right 2000   COLONOSCOPY WITH PROPOFOL N/A 04/28/2019   Procedure: COLONOSCOPY WITH PROPOFOL;  Surgeon: Lin Landsman, MD;  Location: [facility] ENDOSCOPY;  Service: Gastroenterology;  Laterality: N/A;   ESOPHAGOGASTRODUODENOSCOPY (EGD) WITH PROPOFOL N/A 04/28/2019   Procedure: ESOPHAGOGASTRODUODENOSCOPY (EGD) WITH PROPOFOL;  Surgeon: Lin Landsman, MD;  Location: [facility] ENDOSCOPY;  Service: Gastroenterology;  Laterality: N/A;   LAPAROSCOPY     TOTAL KNEE ARTHROPLASTY Right 06/02/2020   Procedure: RIGHT TOTAL KNEE ARTHROPLASTY;  Surgeon:  Mcarthur Rossetti, MD;  Location: [facility];  Service: Orthopedics;  Laterality: Right;  Needs RNFA   UTERINE FIBROID SURGERY     Family History  Problem Relation Age of Onset   Alzheimer's disease Mother    Asthma Mother    Diabetes Mother    Hyperlipidemia Mother    Hypertension Mother    Stroke Mother    Heart disease Father        CHF   Diabetes Father    Hyperlipidemia Father    Alcohol abuse Brother    Heart disease Brother    Asthma Sister    Diabetes Sister    Breast cancer Neg Hx     Allergies: Pantoprazole sodium, Tetracyclines & related, Breo ellipta [fluticasone furoate-vilanterol], and Flagyl [metronidazole] Current Outpatient Medications on File Prior to Visit  Medication Sig Dispense Refill   albuterol (VENTOLIN HFA) 108 (90 Base) MCG/ACT inhaler INHALE 2 PUFFS INTO THE LUNGS EVERY 4 (FOUR) HOURS AS NEEDED FOR WHEEZING OR SHORTNESS OF BREATH. (Patient taking differently: Inhale 2 puffs into the lungs every 4 (four) hours as needed for shortness of breath or wheezing.) 18 g 2   Cholecalciferol (VITAMIN D3) 50 MCG (2000 UT) CAPS Take by mouth.     famotidine (PEPCID) 20 MG tablet Take 1 tablet (20 mg total) by mouth daily. 30 tablet 2   fluticasone (FLOVENT HFA) 110 MCG/ACT inhaler Inhale 2 puffs into the lungs 2 (two)  times daily. 1 each 4   MAGNESIUM PO Take by mouth. 1,000 mg once a day     meloxicam (MOBIC) 15 MG tablet Take 1 tablet (15 mg total) by mouth daily as needed for pain. 30 tablet 3   PRESCRIPTION MEDICATION Inhale 2 puffs into the lungs in the morning and at bedtime. Flixotide Inhaler     Probiotic Product (SOLUBLE FIBER/PROBIOTICS PO) Take 1 capsule by mouth daily.     TURMERIC PO Take by mouth. 1,000mg  once a day     vitamin B-12 (CYANOCOBALAMIN) 1000 MCG tablet Take 1,000 mcg by mouth daily.     Current Facility-Administered Medications on File Prior to Visit  Medication Dose Route Frequency Provider Last Rate Last Admin   ipratropium-albuterol  (DUONEB) 0.5-2.5 (3) MG/3ML nebulizer solution 3 mL  3 mL Nebulization Q6H Scotti Motter, Yvetta Coder, FNP        Social History   Tobacco Use   Smoking status: Never   Smokeless tobacco: Never  Vaping Use   Vaping Use: Never used  Substance Use Topics   Alcohol use: Yes    Alcohol/week: 0.0 standard drinks    Comment: rare   Drug use: No    Review of Systems  Constitutional:  Negative for chills and fever.  Respiratory:  Negative for cough and shortness of breath.   Cardiovascular:  Positive for leg swelling (left leg). Negative for chest pain and palpitations.  Gastrointestinal:  Negative for nausea and vomiting.  Musculoskeletal:  Positive for arthralgias and joint swelling.  Skin:  Negative for rash and wound.     Objective:    BP 112/82 (BP Location: Left Arm, Patient Position: Sitting, Cuff Size: Normal)   Pulse 79   Temp 97.6 F (36.4 C) (Oral)   Ht 5\' 5"  (1.651 m)   Wt 147 lb (66.7 kg)   SpO2 99%   BMI 24.46 kg/m    Physical Exam Vitals reviewed.  Constitutional:      Appearance: She is well-developed.  Eyes:     Conjunctiva/sclera: Conjunctivae normal.  Cardiovascular:     Rate and Rhythm: Normal rate and regular rhythm.     Pulses: Normal pulses.     Heart sounds: Normal heart sounds.  Pulmonary:     Effort: Pulmonary effort is normal.     Breath sounds: Normal breath sounds. No wheezing, rhonchi or rales.  Musculoskeletal:     Lumbar back: No swelling, edema, spasms, tenderness or bony tenderness. Normal range of motion. Negative right straight leg raise test and negative left straight leg raise test.     Right ankle: No swelling. Normal range of motion. Normal pulse.     Left ankle: Swelling and ecchymosis present. No tenderness. Decreased range of motion. Normal pulse.       Legs:     Comments: Left ankle:  Ecchymosis, edema noted lateral and medial ankle/  No pain with Squeeze test at mid calf. Pain over lateral malleolus, medial malleolus. No pain  base of the fifth metatarsal or navicular bone.  She is able to perform dorsiflexion and external rotation.  Sensation intact equally bilateral lower extremities. Palpable pedal pulses.  Negative SLR bilaterally.  Skin:    General: Skin is warm and dry.  Neurological:     Mental Status: She is alert.     Sensory: No sensory deficit.     Deep Tendon Reflexes:     Reflex Scores:      Patellar reflexes are 2+ on the right side  and 2+ on the left side.    Comments: Sensation and strength intact bilateral lower extremities.  Psychiatric:        Speech: Speech normal.        Behavior: Behavior normal.        Thought Content: Thought content normal.       Assessment & Plan:   Problem List Items Addressed This Visit       Other   Ankle swelling - Primary    History and presentation consistent with lateral ankle sprain. Concern she may also have medial involvement over deltoid ligament.   Pending x-ray to evaluate for avulsion fracture.  Discussed the role of MRI or US of the ankle if symptoms fail to improve.  Discussed ice, gentle compression, NSAID use, nonweightbearing.  Patient will let me know how she is doing.      Relevant Orders   DG Ankle Complete Left   Low back pain    Chronic, worsening. Radicular symptoms present.   Pending baseline x-ray to evaluate for degree of stenosis.  Discussed consult with orthopedics. Will follow.       Relevant Orders   DG Lumbar Spine Complete      I have discontinued Sarah-Jane Licht's ipratropium-albuterol, aspirin, and methocarbamol. I am also having her maintain her Probiotic Product (SOLUBLE FIBER/PROBIOTICS PO), famotidine, albuterol, PRESCRIPTION MEDICATION, meloxicam, vitamin D3, MAGNESIUM PO, vitamin B-12, TURMERIC PO, and fluticasone. We will continue to administer ipratropium-albuterol.   No orders of the defined types were placed in this encounter.   Return precautions given.   Risks, benefits, and alternatives of the  medications and treatment plan prescribed today were discussed, and patient expressed understanding.   Education regarding symptom management and diagnosis given to patient on AVS.  Continue to follow with Burnard Hawthorne, FNP for routine health maintenance.   Lavena Bullion and I agreed with plan.   Mable Paris, FNP

## 2021-01-01 NOTE — Assessment & Plan Note (Signed)
History and presentation consistent with lateral ankle sprain. Concern she may also have medial involvement over deltoid ligament.   Pending x-ray to evaluate for avulsion fracture.  Discussed the role of MRI or US of the ankle if symptoms fail to improve.  Discussed ice, gentle compression, NSAID use, nonweightbearing.  Patient will let me know how she is doing.

## 2021-01-01 NOTE — Patient Instructions (Signed)
Ice, ice, ice as we discussed.  And elevation.  You doing a great job with these things.  Please continue as well as continue to be weightbearing until we receive x-ray back and until symptoms start to gradually improve.  May continue to use Advil or meloxicam.  Do not use both.  Ace wrap.   We will call you with x-ray results.  Any new concerns, swelling were to worsen , or development of heat in that left foot or ankle, calf swelling, please let me know right away

## 2021-01-08 ENCOUNTER — Encounter: Payer: Self-pay | Admitting: Family

## 2021-01-08 ENCOUNTER — Other Ambulatory Visit: Payer: Self-pay | Admitting: Family

## 2021-01-08 ENCOUNTER — Telehealth (INDEPENDENT_AMBULATORY_CARE_PROVIDER_SITE_OTHER): Payer: Medicare Other | Admitting: Family

## 2021-01-08 VITALS — Ht 65.0 in

## 2021-01-08 DIAGNOSIS — M25472 Effusion, left ankle: Secondary | ICD-10-CM | POA: Diagnosis not present

## 2021-01-08 DIAGNOSIS — J40 Bronchitis, not specified as acute or chronic: Secondary | ICD-10-CM

## 2021-01-08 MED ORDER — BENZONATATE 100 MG PO CAPS: 100.0000 mg | ORAL_CAPSULE | Freq: Three times a day (TID) | ORAL | 1 refills | Status: DC | PRN

## 2021-01-08 MED ORDER — IPRATROPIUM-ALBUTEROL 0.5-2.5 (3) MG/3ML IN SOLN: 3.0000 mL | Freq: Three times a day (TID) | RESPIRATORY_TRACT | 2 refills | Status: DC | PRN

## 2021-01-08 NOTE — Assessment & Plan Note (Addendum)
No acute respiratory distress. Afebrile. She plans to finish penicillin V 500mg  q6 hours for 7 day total started for dental procedure which has been postponed until 01/24/21. Advised that penicillin V is not first line for URI.  I have given her refill of ipratropium albuterol . Provided her with tessalon. She will let me know how she is doing.

## 2021-01-08 NOTE — Assessment & Plan Note (Signed)
Improving. Unable to assess today. She politely declines consult with orthopedic for second opinion as symptoms improving.

## 2021-01-08 NOTE — Progress Notes (Signed)
Virtual Visit via Video Note  I connected with@  on 01/08/21 at  1:30 PM EST by a video enabled telemedicine application and verified that I am speaking with the correct person using two identifiers.  Location patient: home Location provider:work  Persons participating in the virtual visit: patient, provider  I discussed the limitations of evaluation and management by telemedicine and the availability of in person appointments. The patient expressed understanding and agreed to proceed.  Interactive audio and video telecommunications were attempted between this provider and patient, however failed, due to patient having technical difficulties or patient did not have access to video capability.  We continued and completed visit with audio only.    HPI: Complains of nasal congestion x 7 days, resolved.   Endorses dry cough x 3 days, unchanged. Worse at night.   Taking OTC antihistamine, albuterol twice per day.   3 nights ago, she was wearing a mask and she felt SOB temporarily, with resolution.  No SOB currently, wheezing, fever, SOB, calf swelling, sore throat.    Endorses nasal congestion, sneezing  She would like refill of ipratropium albuterol as has worked well in the past without palpitations.   H/o asthma  No home test available for covid.   She is taking penicillin V 500mg  q6h , started on 3 days ago, currently due to plan of tooth implant in 2 days which has been canceled.   Left ankle swelling and bruising has improved. Declines orthopedic consult.   ROS: See pertinent positives and negatives per HPI.    EXAM:  VITALS per patient if applicable: Ht 5\' 5"  (1.651 m)   BMI 24.46 kg/m  BP Readings from Last 3 Encounters:  01/01/21 112/82  11/14/20 140/84  11/06/20 120/73   Wt Readings from Last 3 Encounters:  01/01/21 147 lb (66.7 kg)  11/14/20 145 lb 9.6 oz (66 kg)  11/06/20 147 lb (66.7 kg)      ASSESSMENT AND PLAN:  Discussed the following assessment  and plan:  Problem List Items Addressed This Visit       Respiratory   Bronchitis - Primary    No acute respiratory distress. Afebrile. She plans to finish penicillin V 500mg  q6 hours for 7 day total started for dental procedure which has been postponed until 01/24/21. Advised that penicillin V is not first line for URI.  I have given her refill of ipratropium albuterol . Provided her with tessalon. She will let me know how she is doing.         Relevant Medications   ipratropium-albuterol (DUONEB) 0.5-2.5 (3) MG/3ML SOLN   benzonatate (TESSALON) 100 MG capsule     Other   Ankle swelling    Improving. Unable to assess today. She politely declines consult with orthopedic for second opinion as symptoms improving.       -we discussed possible serious and likely etiologies, options for evaluation and workup, limitations of telemedicine visit vs in person visit, treatment, treatment risks and precautions. Pt prefers to treat via telemedicine empirically rather then risking or undertaking an in person visit at this moment.  .   I discussed the assessment and treatment plan with the patient. The patient was provided an opportunity to ask questions and all were answered. The patient agreed with the plan and demonstrated an understanding of the instructions.   The patient was advised to call back or seek an in-person evaluation if the symptoms worsen or if the condition fails to improve as anticipated.  I have  spent 10 minutes with a patient on the phone.      Mable Paris, FNP

## 2021-01-09 ENCOUNTER — Telehealth: Payer: Self-pay | Admitting: Family

## 2021-01-09 ENCOUNTER — Other Ambulatory Visit: Payer: Self-pay | Admitting: Family

## 2021-01-09 DIAGNOSIS — J40 Bronchitis, not specified as acute or chronic: Secondary | ICD-10-CM

## 2021-01-09 MED ORDER — IPRATROPIUM BROMIDE 0.02 % IN SOLN: 0.5000 mg | Freq: Two times a day (BID) | RESPIRATORY_TRACT | 12 refills | Status: DC | PRN

## 2021-01-09 NOTE — Telephone Encounter (Signed)
LM making patient aware of below. I asked that she call back if nay issues or questions.

## 2021-01-09 NOTE — Telephone Encounter (Signed)
Call pt  Unfortunately DuoNeb is not covered by insurance and I got a note notification from pharmacy.  I have sent in Atrovent in its place.  Please let me know if any issues in picking up this medication

## 2021-01-10 ENCOUNTER — Encounter: Payer: Self-pay | Admitting: Family

## 2021-01-11 ENCOUNTER — Encounter: Payer: Self-pay | Admitting: Emergency Medicine

## 2021-01-11 ENCOUNTER — Encounter: Payer: Self-pay | Admitting: Family

## 2021-01-11 ENCOUNTER — Emergency Department: Payer: Medicare Other

## 2021-01-11 ENCOUNTER — Other Ambulatory Visit: Payer: Self-pay

## 2021-01-11 ENCOUNTER — Emergency Department
Admission: EM | Admit: 2021-01-11 | Discharge: 2021-01-11 | Disposition: A | Discharge status: Home / Self Care | Payer: Medicare Other | Attending: Emergency Medicine | Admitting: Emergency Medicine

## 2021-01-11 DIAGNOSIS — Z20822 Contact with and (suspected) exposure to covid-19: Secondary | ICD-10-CM | POA: Diagnosis not present

## 2021-01-11 DIAGNOSIS — Z5321 Procedure and treatment not carried out due to patient leaving prior to being seen by health care provider: Secondary | ICD-10-CM | POA: Diagnosis not present

## 2021-01-11 DIAGNOSIS — R531 Weakness: Secondary | ICD-10-CM | POA: Insufficient documentation

## 2021-01-11 DIAGNOSIS — J45909 Unspecified asthma, uncomplicated: Secondary | ICD-10-CM | POA: Diagnosis not present

## 2021-01-11 DIAGNOSIS — R0602 Shortness of breath: Secondary | ICD-10-CM | POA: Diagnosis not present

## 2021-01-11 DIAGNOSIS — R059 Cough, unspecified: Secondary | ICD-10-CM | POA: Diagnosis not present

## 2021-01-11 LAB — RESP PANEL BY RT-PCR (FLU A&B, COVID) ARPGX2
Influenza A by PCR: NEGATIVE
Influenza B by PCR: NEGATIVE
SARS Coronavirus 2 by RT PCR: NEGATIVE

## 2021-01-11 LAB — BASIC METABOLIC PANEL
Anion gap: 5 (ref 5–15)
BUN: 10 mg/dL (ref 8–23)
CO2: 28 mmol/L (ref 22–32)
Calcium: 9.5 mg/dL (ref 8.9–10.3)
Chloride: 105 mmol/L (ref 98–111)
Creatinine, Ser: 0.82 mg/dL (ref 0.44–1.00)
GFR, Estimated: >60 mL/min (ref >60)
Glucose, Bld: 97 mg/dL (ref 70–99)
Potassium: 4 mmol/L (ref 3.5–5.1)
Sodium: 138 mmol/L (ref 135–145)

## 2021-01-11 LAB — CBC
HCT: 46.2 % — ABNORMAL HIGH (ref 36.0–46.0)
Hemoglobin: 15 g/dL (ref 12.0–15.0)
MCH: 29 pg (ref 26.0–34.0)
MCHC: 32.5 g/dL (ref 30.0–36.0)
MCV: 89.2 fL (ref 80.0–100.0)
Platelets: 363 10*3/uL (ref 150–400)
RBC: 5.18 MIL/uL — ABNORMAL HIGH (ref 3.87–5.11)
RDW: 13.3 % (ref 11.5–15.5)
WBC: 8.1 10*3/uL (ref 4.0–10.5)
nRBC: 0 % (ref 0.0–0.2)

## 2021-01-11 MED ORDER — ALBUTEROL SULFATE HFA 108 (90 BASE) MCG/ACT IN AERS
2.0000 | INHALATION_SPRAY | RESPIRATORY_TRACT | Status: DC | PRN
  Filled 2021-01-11: qty 6.7

## 2021-01-11 MED ORDER — IPRATROPIUM-ALBUTEROL 0.5-2.5 (3) MG/3ML IN SOLN
3.0000 mL | Freq: Once | RESPIRATORY_TRACT | Status: AC
  Administered 2021-01-11: 3 mL via RESPIRATORY_TRACT
  Filled 2021-01-11: qty 3

## 2021-01-11 NOTE — ED Triage Notes (Signed)
Pt in with weakness, cough, and sob. States she has chronic asthma, and rescue inhaler not effective. Recently sick with URI, and states she is more sob today. Sats 99% on RA, productive cough, denies fevers

## 2021-01-12 ENCOUNTER — Other Ambulatory Visit: Payer: Self-pay

## 2021-01-12 DIAGNOSIS — J40 Bronchitis, not specified as acute or chronic: Secondary | ICD-10-CM

## 2021-01-12 MED ORDER — IPRATROPIUM BROMIDE 0.02 % IN SOLN: 0.5000 mg | Freq: Two times a day (BID) | RESPIRATORY_TRACT | 12 refills | Status: DC | PRN

## 2021-01-12 NOTE — Telephone Encounter (Signed)
Pt wants to know about medications she is supposed to be taking. Pt was also advised returning to the ED or Urgent Care is her best option due to [facility] having no available apts. Pt states she was there last night and didn't have any medication prescribed to her.

## 2021-01-12 NOTE — Telephone Encounter (Signed)
I spoke with patient she was breathing better today after treatment last night. She did not want to wait 10 hours to see physician. Chest xray was done & lungs were clear. She thinks she may need antibiotic. No availability here so I have scheduled her tomorrow at [place] at 11:15. I have resent neb solution bc [facility] she siad never received. She will call back today if any issues with her picking up.

## 2021-01-13 ENCOUNTER — Other Ambulatory Visit: Payer: Self-pay

## 2021-01-13 ENCOUNTER — Ambulatory Visit
Admission: RE | Admit: 2021-01-13 | Discharge: 2021-01-13 | Disposition: A | Discharge status: Home / Self Care | Payer: Medicare Other | Source: Ambulatory Visit

## 2021-01-13 VITALS — BP 142/91 | HR 75 | Temp 97.9°F | Resp 18

## 2021-01-13 DIAGNOSIS — R059 Cough, unspecified: Secondary | ICD-10-CM

## 2021-01-13 DIAGNOSIS — J4530 Mild persistent asthma, uncomplicated: Secondary | ICD-10-CM

## 2021-01-13 MED ORDER — METHYLPREDNISOLONE SODIUM SUCC 125 MG IJ SOLR
40.0000 mg | Freq: Once | INTRAMUSCULAR | Status: AC
  Administered 2021-01-13: 40 mg via INTRAMUSCULAR

## 2021-01-13 NOTE — ED Provider Notes (Signed)
Roderic Palau    CSN: [id number] Arrival date & time: 01/13/21  1118      History   Chief Complaint Chief Complaint  Patient presents with   Cough     HPI Lynn Peters is a 65 y.o. female.  Patient presents with 1 week history of persistent cough which is improving in the past day or so.  She denies shortness of breath, fever, chills, ear pain, sore throat, or other symptoms.  Patient went to [facility] ED on 01/11/2021 but left without being seen due to wait time; In that visit she was negative for COVID and Flu; blood work was unremarkable; CXR negative for acute disease.  She messaged her PCP on 01/11/1021 and was prescribed Duoneb treatments.  She requests Zithromax.  Her medical history including asthma.  The history is provided by the patient and medical records.   Past Medical History:  Diagnosis Date   Allergy    Anxiety    Arthritis    Asthma    Chest pain    Chicken pox    Complication of anesthesia    Difficulty breathing after GYN procedure   Diverticulitis 2009   GERD (gastroesophageal reflux disease)    Pneumonia     Patient Active Problem List   Diagnosis Date Noted   Bronchitis 01/08/2021   Ankle swelling 01/01/2021   Low back pain 01/01/2021   Atherosclerosis of aorta ([place]) 11/06/2020   GERD (gastroesophageal reflux disease)    Hirsutism 09/08/2020   Status post right knee replacement 06/02/2020   Erythrocytosis 05/07/2020   Fatigue 05/07/2020   Welcome to Medicare preventive visit 05/03/2020   Unilateral primary osteoarthritis, right knee 02/16/2020   Abnormal CT scan, gastrointestinal tract    Diarrhea    Suprapubic pain 11/10/2018   Disorder of ureter 11/10/2018   Abdominal pain 10/23/2018   Arthritis 09/16/2017   Hyperlipidemia LDL goal <160 08/30/2017   Apneic episode 08/29/2017   Atypical chest pain 08/29/2017   Acquired bilateral flat feet 08/20/2017   Onychomycosis 08/20/2017   Reflux esophagitis 02/16/2017   Acute left-sided  low back pain with left-sided sciatica 09/16/2016   Dysuria 08/15/2016   Overweight (BMI 25.0-29.9) 08/15/2016   Allergic rhinitis due to pollen 08/05/2016   PVC (premature ventricular contraction) 03/29/2016   Mild persistent asthma 10/04/2015   Abnormal Pap smear of cervix 10/04/2015   Colon cancer screening 02/28/2015   Family history of diabetes mellitus 02/16/2015    Past Surgical History:  Procedure Laterality Date   ABDOMINAL HYSTERECTOMY  2004   partial; has cervix and ovaries per patient; done for fibroids, noncancerous   ANTERIOR CRUCIATE LIGAMENT REPAIR Right 2000   COLONOSCOPY WITH PROPOFOL N/A 04/28/2019   Procedure: COLONOSCOPY WITH PROPOFOL;  Surgeon: Lin Landsman, MD;  Location: [facility] ENDOSCOPY;  Service: Gastroenterology;  Laterality: N/A;   ESOPHAGOGASTRODUODENOSCOPY (EGD) WITH PROPOFOL N/A 04/28/2019   Procedure: ESOPHAGOGASTRODUODENOSCOPY (EGD) WITH PROPOFOL;  Surgeon: Lin Landsman, MD;  Location: [facility] ENDOSCOPY;  Service: Gastroenterology;  Laterality: N/A;   LAPAROSCOPY     TOTAL KNEE ARTHROPLASTY Right 06/02/2020   Procedure: RIGHT TOTAL KNEE ARTHROPLASTY;  Surgeon: Mcarthur Rossetti, MD;  Location: [facility] ORS;  Service: Orthopedics;  Laterality: Right;  Needs RNFA   UTERINE FIBROID SURGERY      OB History   No obstetric history on file.      Home Medications    Prior to Admission medications   Medication Sig Start Date End Date Taking? Authorizing Provider  penicillin  v potassium (VEETID) 500 MG tablet Take 500 mg by mouth 4 (four) times daily.   Yes [provider]  albuterol (VENTOLIN HFA) 108 (90 Base) MCG/ACT inhaler INHALE 2 PUFFS INTO THE LUNGS EVERY 4 (FOUR) HOURS AS NEEDED FOR WHEEZING OR SHORTNESS OF BREATH. Patient taking differently: Inhale 2 puffs into the lungs every 4 (four) hours as needed for shortness of breath or wheezing. 03/22/20   Burnard Hawthorne, FNP  benzonatate (TESSALON) 100 MG capsule Take 1 capsule (100 mg  total) by mouth 3 (three) times daily as needed for cough. 01/08/21   Burnard Hawthorne, FNP  Cholecalciferol (VITAMIN D3) 50 MCG (2000 UT) CAPS Take by mouth.    [provider]  famotidine (PEPCID) 20 MG tablet Take 1 tablet (20 mg total) by mouth daily. 02/14/17   Crecencio Mc, MD  fluticasone (FLOVENT HFA) 110 MCG/ACT inhaler Inhale 2 puffs into the lungs 2 (two) times daily. 11/06/20   Burnard Hawthorne, FNP  ipratropium (ATROVENT) 0.02 % nebulizer solution Take 2.5 mLs (0.5 mg total) by nebulization 2 (two) times daily as needed for wheezing or shortness of breath. 01/12/21   Burnard Hawthorne, FNP  MAGNESIUM PO Take by mouth. 1,000 mg once a day    [provider]  meloxicam (MOBIC) 15 MG tablet Take 1 tablet (15 mg total) by mouth daily as needed for pain. 07/13/20   Mcarthur Rossetti, MD  PRESCRIPTION MEDICATION Inhale 2 puffs into the lungs in the morning and at bedtime. Flixotide Inhaler    [provider]  Probiotic Product (SOLUBLE FIBER/PROBIOTICS PO) Take 1 capsule by mouth daily.    [provider]  TURMERIC PO Take by mouth. 1,000mg  once a day    [provider]  vitamin B-12 (CYANOCOBALAMIN) 1000 MCG tablet Take 1,000 mcg by mouth daily.    [provider]    Family History Family History  Problem Relation Age of Onset   Alzheimer's disease Mother    Asthma Mother    Diabetes Mother    Hyperlipidemia Mother    Hypertension Mother    Stroke Mother    Heart disease Father        CHF   Diabetes Father    Hyperlipidemia Father    Alcohol abuse Brother    Heart disease Brother    Asthma Sister    Diabetes Sister    Breast cancer Neg Hx     Social History Social History   Tobacco Use   Smoking status: Never   Smokeless tobacco: Never  Vaping Use   Vaping Use: Never used  Substance Use Topics   Alcohol use: Yes    Alcohol/week: 0.0 standard drinks    Comment: rare   Drug use: No     Allergies    Pantoprazole sodium, Tetracyclines & related, Breo ellipta [fluticasone furoate-vilanterol], and Flagyl [metronidazole]   Review of Systems Review of Systems  Constitutional:  Negative for chills and fever.  HENT:  Negative for ear pain and sore throat.   Respiratory:  Positive for cough. Negative for shortness of breath.   Cardiovascular:  Negative for chest pain and palpitations.  All other systems reviewed and are negative.   Physical Exam Triage Vital Signs ED Triage Vitals  Enc Vitals Group     BP      Pulse      Resp      Temp      Temp src  SpO2      Weight      Height      Head Circumference      Peak Flow      Pain Score      Pain Loc      Pain Edu?      Excl. in [place]?    No data found.  Updated Vital Signs BP (!) 142/91 (BP Location: Left Arm)   Pulse 75   Temp 97.9 F (36.6 C) (Oral)   Resp 18   SpO2 98%   Visual Acuity Right Eye Distance:   Left Eye Distance:   Bilateral Distance:    Right Eye Near:   Left Eye Near:    Bilateral Near:     Physical Exam Vitals and nursing note reviewed.  Constitutional:      General: She is not in acute distress.    Appearance: Normal appearance. She is well-developed. She is not ill-appearing.  HENT:     Right Ear: Tympanic membrane normal.     Left Ear: Tympanic membrane normal.     Nose: Nose normal.     Mouth/Throat:     Mouth: Mucous membranes are moist.     Pharynx: Oropharynx is clear.  Cardiovascular:     Rate and Rhythm: Normal rate and regular rhythm.     Heart sounds: Normal heart sounds.  Pulmonary:     Effort: Pulmonary effort is normal. No respiratory distress.     Breath sounds: Normal breath sounds. No wheezing, rhonchi or rales.  Abdominal:     Palpations: Abdomen is soft.     Tenderness: There is no abdominal tenderness.  Musculoskeletal:     Cervical back: Neck supple.  Skin:    General: Skin is warm and dry.  Neurological:     Mental Status: She is alert.  Psychiatric:         Mood and Affect: Mood normal.        Behavior: Behavior normal.     UC Treatments / Results  Labs (all labs ordered are listed, but only abnormal results are displayed) Labs Reviewed - No data to display  EKG   Radiology DG Chest [facility] View  Result Date: 01/11/2021 CLINICAL DATA:  Weakness, coughing and shortness of breath. History of asthma. EXAM: PORTABLE CHEST 1 VIEW COMPARISON:  None. FINDINGS: The heart size and mediastinal contours are within normal limits. Both lungs are hyperexpanded but clear. The visualized skeletal structures are unremarkable. IMPRESSION: No evidence of acute chest disease.  Stable hyperinflated chest. Electronically Signed   By: Telford Nab M.D.   On: 01/11/2021 21:37    Procedures Procedures (including critical care time)  Medications Ordered in UC Medications  methylPREDNISolone sodium succinate (SOLU-MEDROL) 125 mg/2 mL injection 40 mg (40 mg Intramuscular Given 01/13/21 1211)    Initial Impression / Assessment and Plan / UC Course  I have reviewed the triage vital signs and the nursing notes.  Pertinent labs & imaging results that were available during my care of the patient were reviewed by me and considered in my medical decision making (see chart for details).   Cough, Asthma.  Patient is well-appearing and her exam is reassuring.  Lungs are clear; O2 sat 98%.  Patient is requesting Zithromax but no indication of bacterial infection at this time.  Solu-Medrol 40 mg given here; patient declines oral prednisone.  Instructed her to continue Duonebs.  Instructed her to follow-up with her PCP on Monday.  ED precautions discussed.  She agrees to plan of care.   Final Clinical Impressions(s) / UC Diagnoses   Final diagnoses:  Cough, unspecified type  Mild persistent asthma, unspecified whether complicated     Discharge Instructions      You were given an injection of Solu-Medrol today.  You declined treatment with prednisone.     Continue your breathing treatments.  Follow-up with your primary care provider on Monday.  Go to the emergency department if you have acute shortness of breath or other concerning symptoms.         ED Prescriptions   None    PDMP not reviewed this encounter.   Sharion Balloon, NP 01/13/21 1225

## 2021-01-13 NOTE — Discharge Instructions (Addendum)
You were given an injection of Solu-Medrol today.  You declined treatment with prednisone.    Continue your breathing treatments.  Follow-up with your primary care provider on Monday.  Go to the emergency department if you have acute shortness of breath or other concerning symptoms.

## 2021-01-13 NOTE — ED Triage Notes (Signed)
Pt here for persistent cough that she states has started to get better. She has a hx of asthma and was seen in the ED 2 nights ago with diagnostic tests completed, but she did not stay to be seen. She spoke with her primary who prescribed her additional breathing treatments at home. She presents today requesting a Z-pack.

## 2021-03-13 ENCOUNTER — Emergency Department: Payer: Medicare Other

## 2021-03-13 ENCOUNTER — Other Ambulatory Visit: Payer: Self-pay

## 2021-03-13 ENCOUNTER — Telehealth: Payer: Self-pay | Admitting: Family

## 2021-03-13 ENCOUNTER — Emergency Department
Admission: EM | Admit: 2021-03-13 | Discharge: 2021-03-13 | Disposition: A | Discharge status: Home / Self Care | Payer: Medicare Other | Attending: Emergency Medicine | Admitting: Emergency Medicine

## 2021-03-13 DIAGNOSIS — R002 Palpitations: Secondary | ICD-10-CM | POA: Diagnosis not present

## 2021-03-13 DIAGNOSIS — J45909 Unspecified asthma, uncomplicated: Secondary | ICD-10-CM | POA: Diagnosis not present

## 2021-03-13 LAB — CBC
HCT: 47.6 % — ABNORMAL HIGH (ref 36.0–46.0)
Hemoglobin: 15.3 g/dL — ABNORMAL HIGH (ref 12.0–15.0)
MCH: 29 pg (ref 26.0–34.0)
MCHC: 32.1 g/dL (ref 30.0–36.0)
MCV: 90.2 fL (ref 80.0–100.0)
Platelets: 312 10*3/uL (ref 150–400)
RBC: 5.28 MIL/uL — ABNORMAL HIGH (ref 3.87–5.11)
RDW: 13 % (ref 11.5–15.5)
WBC: 7.4 10*3/uL (ref 4.0–10.5)
nRBC: 0 % (ref 0.0–0.2)

## 2021-03-13 LAB — BASIC METABOLIC PANEL
Anion gap: 9 (ref 5–15)
BUN: 10 mg/dL (ref 8–23)
CO2: 26 mmol/L (ref 22–32)
Calcium: 9.4 mg/dL (ref 8.9–10.3)
Chloride: 104 mmol/L (ref 98–111)
Creatinine, Ser: 0.74 mg/dL (ref 0.44–1.00)
GFR, Estimated: >60 mL/min (ref >60)
Glucose, Bld: 106 mg/dL — ABNORMAL HIGH (ref 70–99)
Potassium: 3.5 mmol/L (ref 3.5–5.1)
Sodium: 139 mmol/L (ref 135–145)

## 2021-03-13 LAB — TROPONIN I (HIGH SENSITIVITY): Troponin I (High Sensitivity): 3 ng/L (ref <18)

## 2021-03-13 NOTE — ED Triage Notes (Signed)
Pt reports that she had a dental implant placed a week ago and completed her amoxicillin yesterday. Pt states that she has been having palpitations and has never had them before and its causing her some dizziness

## 2021-03-13 NOTE — Telephone Encounter (Signed)
FYI patient is in ED now.

## 2021-03-13 NOTE — Discharge Instructions (Addendum)
Your EKG shows that you are in a normal sinus rhythm.  Your blood work was also reassuring and your cardiac enzymes were negative.  Your palpitations may be related to premature beats.  Please avoid caffeine and stay hydrated.  You should call Dr. Tyrell Antonio office to schedule an appointment.

## 2021-03-13 NOTE — Telephone Encounter (Signed)
Pt called in stating that she maybe going into Afib. Pt stated that she is experiencing irregular heartbeat. Pt requested to speak with Np Arnett or CMA. Advise Pt they were seeing Pt. Transfer Pt to Access Nurse

## 2021-03-13 NOTE — ED Provider Notes (Signed)
[facility] Provider Note    Event Date/Time   First MD Initiated Contact with Patient 03/13/21 1248     (approximate)   History   Palpitations   HPI  Lynn Peters is a 66 y.o. female with past medical history of GERD, asthma and arthritis who presents with palpitations.  Symptoms been going on for the past week.  Primarily at night when she is lying in bed she feels like her heart is beating irregularly.  Last night she had significant anxiety associated with this and was not able to sleep.  She is now having some mild chest discomfort.  Describes a dull pressure-like sensation left side of her chest which does not radiate is not exertional.  She has no associated dyspnea.  Patient has been taking an antibiotic for recent tooth implant.  Denies fevers or chills.  No nausea vomiting diarrhea.  Denies significant caffeine intake.    Past Medical History:  Diagnosis Date   Allergy    Anxiety    Arthritis    Asthma    Chest pain    Chicken pox    Complication of anesthesia    Difficulty breathing after GYN procedure   Diverticulitis 2009   GERD (gastroesophageal reflux disease)    Pneumonia     Patient Active Problem List   Diagnosis Date Noted   Bronchitis 01/08/2021   Ankle swelling 01/01/2021   Low back pain 01/01/2021   Atherosclerosis of aorta ([place]) 11/06/2020   GERD (gastroesophageal reflux disease)    Hirsutism 09/08/2020   Status post right knee replacement 06/02/2020   Erythrocytosis 05/07/2020   Fatigue 05/07/2020   Welcome to Medicare preventive visit 05/03/2020   Unilateral primary osteoarthritis, right knee 02/16/2020   Abnormal CT scan, gastrointestinal tract    Diarrhea    Suprapubic pain 11/10/2018   Disorder of ureter 11/10/2018   Abdominal pain 10/23/2018   Arthritis 09/16/2017   Hyperlipidemia LDL goal <160 08/30/2017   Apneic episode 08/29/2017   Atypical chest pain 08/29/2017   Acquired bilateral flat feet 08/20/2017    Onychomycosis 08/20/2017   Reflux esophagitis 02/16/2017   Acute left-sided low back pain with left-sided sciatica 09/16/2016   Dysuria 08/15/2016   Overweight (BMI 25.0-29.9) 08/15/2016   Allergic rhinitis due to pollen 08/05/2016   PVC (premature ventricular contraction) 03/29/2016   Mild persistent asthma 10/04/2015   Abnormal Pap smear of cervix 10/04/2015   Colon cancer screening 02/28/2015   Family history of diabetes mellitus 02/16/2015     Physical Exam  Triage Vital Signs: ED Triage Vitals [03/13/21 1208]  Enc Vitals Group     BP (!) 150/107     Pulse Rate 92     Resp 16     Temp (!) 97.5 F (36.4 C)     Temp Source Oral     SpO2 98 %     Weight 147 lb (66.7 kg)     Height 5\' 5"  (1.651 m)     Head Circumference      Peak Flow      Pain Score 3     Pain Loc      Pain Edu?      Excl. in [place]?     Most recent vital signs: Vitals:   03/13/21 1248 03/13/21 1249  BP: (!) 148/77   Pulse: 87 87  Resp: 17   Temp:    SpO2: 100% 100%     General: Awake, no distress.  Anxious appearing  CV:  Good peripheral perfusion.  No lower extremity edema or asymmetry Resp:  Normal effort.  Abd:  No distention.  Neuro:             Awake, Alert, Oriented x 3  Other:     ED Results / Procedures / Treatments  Labs (all labs ordered are listed, but only abnormal results are displayed) Labs Reviewed  BASIC METABOLIC PANEL - Abnormal; Notable for the following components:      Result Value   Glucose, Bld 106 (*)    All other components within normal limits  CBC - Abnormal; Notable for the following components:   RBC 5.28 (*)    Hemoglobin 15.3 (*)    HCT 47.6 (*)    All other components within normal limits  TROPONIN I (HIGH SENSITIVITY)     EKG  EKG interpretation performed by myself: NSR, nml axis, nml intervals, no acute ischemic changes    RADIOLOGY  I reviewed the CXR which does not show any acute cardiopulmonary process; agree with radiology report     PROCEDURES:  Critical Care performed: No  Procedures  The patient is on the cardiac monitor to evaluate for evidence of arrhythmia and/or significant heart rate changes.   MEDICATIONS ORDERED IN ED: Medications - No data to display   IMPRESSION / MDM / [place] / ED COURSE  I reviewed the triage vital signs and the nursing notes.                              Differential diagnosis includes, but is not limited to, premature atrial contractions, arrhythmia, dehydration, electrolyte abnormality, ACS  Patient is a 66 year old female presents with chief complaint of palpitation.  Has been going on for about a week primarily notices them at night.  Last night she felt her heart beating irregularly and was unable to sleep as a result and experience significant anxiety associated with it.  This morning she has had some mild chest discomfort which describes as a dull ache that is nonexertional nonpleuritic and without other associated symptoms.  Patient has been able to do her weekly exercise and has not been symptomatic during this.  I am reassured that patient is mostly symptomatic when she is resting at night.  I reviewed her EKG which shows normal sinus rhythm.  No arrhythmias while on the monitor.  Her vital signs are all reassuring she is not tachycardic.  I also reviewed her labs which are negative for any electrolyte abnormality, CBC also reassuring and her troponin is negative.  Ultimately given we have not identified any arrhythmia or cardiac ischemia I think that she is appropriate for outpatient management.  She would like to see Dr. Fletcher Anon who also sees her husband.       FINAL CLINICAL IMPRESSION(S) / ED DIAGNOSES   Final diagnoses:  Palpitations     Rx / DC Orders   ED Discharge Orders     None        Note:  This document was prepared using Dragon voice recognition software and may include unintentional dictation errors.   Rada Hay,  MD 03/13/21 1325

## 2021-03-14 NOTE — Telephone Encounter (Signed)
Noted Seen in ed

## 2021-03-16 ENCOUNTER — Encounter: Payer: Self-pay | Admitting: Cardiology

## 2021-03-16 ENCOUNTER — Ambulatory Visit (INDEPENDENT_AMBULATORY_CARE_PROVIDER_SITE_OTHER): Payer: Medicare Other

## 2021-03-16 ENCOUNTER — Ambulatory Visit (INDEPENDENT_AMBULATORY_CARE_PROVIDER_SITE_OTHER): Payer: Medicare Other | Admitting: Cardiology

## 2021-03-16 ENCOUNTER — Other Ambulatory Visit: Payer: Self-pay

## 2021-03-16 VITALS — BP 124/74 | HR 66 | Ht 65.5 in | Wt 147.0 lb

## 2021-03-16 DIAGNOSIS — R002 Palpitations: Secondary | ICD-10-CM

## 2021-03-16 DIAGNOSIS — Z13220 Encounter for screening for lipoid disorders: Secondary | ICD-10-CM

## 2021-03-16 DIAGNOSIS — K219 Gastro-esophageal reflux disease without esophagitis: Secondary | ICD-10-CM

## 2021-03-16 NOTE — Progress Notes (Signed)
Cardiology Office Note:    Date:  03/16/2021   ID:  Lynn Peters, DOB Feb 10, 1956, MRN [id number]  PCP:  Burnard Hawthorne, FNP   [facility] Providers Cardiologist:  None     Referring MD: Burnard Hawthorne, FNP   Chief Complaint  Patient presents with   New Patient (Initial Visit)    ED follow up -- Patient c.o increasede SOB and rapid heartbeat. Meds verbally with patient.     History of Present Illness:    Lynn Peters is a 66 y.o. female with a hx of GERD, asthma who presents due to palpitations.  Patient had a recent tooth extraction 10 days ago, was given an oral medication for rinsing.  She noticed palpitations after using the oral rinse.  Also felt short of breath.  She was told this was a side effect of her dental medications/treatments.  She stopped using the oral rinse, but still noticed irregular heartbeats occurring almost every other day lasting anywhere from 25 minutes to an entire day.  Denies dizziness or syncope.  Denies shortness of breath.  She is wondering if symptoms could be related to stress since she is doing a very stressful masters course in psychology.  Also stressed out from medical issues occurring over the past year.  She denies any personal history of heart disease.  Past Medical History:  Diagnosis Date   Allergy    Anxiety    Arthritis    Asthma    Chest pain    Chicken pox    Complication of anesthesia    Difficulty breathing after GYN procedure   Diverticulitis 2009   GERD (gastroesophageal reflux disease)    Pneumonia     Past Surgical History:  Procedure Laterality Date   ABDOMINAL HYSTERECTOMY  2004   partial; has cervix and ovaries per patient; done for fibroids, noncancerous   ANTERIOR CRUCIATE LIGAMENT REPAIR Right 2000   COLONOSCOPY WITH PROPOFOL N/A 04/28/2019   Procedure: COLONOSCOPY WITH PROPOFOL;  Surgeon: Lin Landsman, MD;  Location: [facility] ENDOSCOPY;  Service: Gastroenterology;  Laterality: N/A;    ESOPHAGOGASTRODUODENOSCOPY (EGD) WITH PROPOFOL N/A 04/28/2019   Procedure: ESOPHAGOGASTRODUODENOSCOPY (EGD) WITH PROPOFOL;  Surgeon: Lin Landsman, MD;  Location: [facility] ENDOSCOPY;  Service: Gastroenterology;  Laterality: N/A;   LAPAROSCOPY     TOTAL KNEE ARTHROPLASTY Right 06/02/2020   Procedure: RIGHT TOTAL KNEE ARTHROPLASTY;  Surgeon: Mcarthur Rossetti, MD;  Location: [facility] ORS;  Service: Orthopedics;  Laterality: Right;  Needs RNFA   UTERINE FIBROID SURGERY      Current Medications: Current Meds  Medication Sig   albuterol (VENTOLIN HFA) 108 (90 Base) MCG/ACT inhaler INHALE 2 PUFFS INTO THE LUNGS EVERY 4 (FOUR) HOURS AS NEEDED FOR WHEEZING OR SHORTNESS OF BREATH.   Cholecalciferol (VITAMIN D3) 50 MCG (2000 UT) CAPS Take by mouth.   famotidine (PEPCID) 20 MG tablet Take 1 tablet (20 mg total) by mouth daily.   fluticasone (FLOVENT HFA) 110 MCG/ACT inhaler Inhale 2 puffs into the lungs 2 (two) times daily.   ipratropium (ATROVENT) 0.02 % nebulizer solution Take 2.5 mLs (0.5 mg total) by nebulization 2 (two) times daily as needed for wheezing or shortness of breath.   MAGNESIUM PO Take by mouth. 1,000 mg once a day   PRESCRIPTION MEDICATION Inhale 2 puffs into the lungs in the morning and at bedtime. Flixotide Inhaler   Probiotic Product (SOLUBLE FIBER/PROBIOTICS PO) Take 1 capsule by mouth daily.   TURMERIC PO Take by mouth. 1,000mg  once a day  vitamin B-12 (CYANOCOBALAMIN) 1000 MCG tablet Take 1,000 mcg by mouth daily.   Current Facility-Administered Medications for the 03/16/21 encounter (Office Visit) with Kate Sable, MD  Medication   ipratropium-albuterol (DUONEB) 0.5-2.5 (3) MG/3ML nebulizer solution 3 mL     Allergies:   Pantoprazole sodium, Tetracyclines & related, Breo ellipta [fluticasone furoate-vilanterol], and Flagyl [metronidazole]   Social History   Socioeconomic History   Marital status: Married    Spouse name: Not on file   Number of children: Not on  file   Years of education: Not on file   Highest education level: Not on file  Occupational History   Not on file  Tobacco Use   Smoking status: Never   Smokeless tobacco: Never  Vaping Use   Vaping Use: Never used  Substance and Sexual Activity   Alcohol use: Yes    Alcohol/week: 0.0 standard drinks    Comment: rare   Drug use: No   Sexual activity: Yes    Partners: Male    Birth control/protection: Surgical  Other Topics Concern   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      Diet- gym, knee limits running, paddleboating. Interval training 3x per week.       Diet-regular      Social Determinants of Radio broadcast assistant Strain: Not on file  Food Insecurity: Not on file  Transportation Needs: Not on file  Physical Activity: Not on file  Stress: Not on file  Social Connections: Not on file     Family History: The patient's family history includes Alcohol abuse in her brother; Alzheimer's disease in her mother; Asthma in her mother and sister; Diabetes in her father, mother, and sister; Heart disease in her brother and father; Hyperlipidemia in her father and mother; Hypertension in her mother; Stroke in her mother. There is no history of Breast cancer.  ROS:   Please see the history of present illness.     All other systems reviewed and are negative.  EKGs/Labs/Other Studies Reviewed:    The following studies were reviewed today:   EKG:  EKG is  ordered today.  The ekg ordered today demonstrates normal sinus rhythm  Recent Labs: 05/03/2020: ALT 16; TSH 2.42 03/13/2021: BUN 10; Creatinine, Ser 0.74; Hemoglobin 15.3; Platelets 312; Potassium 3.5; Sodium 139  Recent Lipid Panel    Component Value Date/Time   CHOL 239 (H) 05/03/2020 1424   TRIG 107.0 05/03/2020 1424   HDL 62.90 05/03/2020 1424   CHOLHDL 4 05/03/2020 1424   VLDL 21.4 05/03/2020 1424   LDLCALC  154 (H) 05/03/2020 1424     Risk Assessment/Calculations:          Physical Exam:    VS:  BP 124/74 (BP Location: Left Arm, Patient Position: Sitting, Cuff Size: Normal)    Pulse 66    Ht 5' 5.5" (1.664 m)    Wt 147 lb (66.7 kg)    SpO2 99%    BMI 24.09 kg/m     Wt Readings from Last 3 Encounters:  03/16/21 147 lb (66.7 kg)  03/13/21 147 lb (66.7 kg)  01/01/21 147 lb (66.7 kg)     GEN:  Well nourished, well developed in no acute distress HEENT: Normal NECK: No JVD; No carotid bruits LYMPHATICS: No lymphadenopathy CARDIAC: RRR, no murmurs, rubs, gallops RESPIRATORY:  Clear to auscultation  without rales, wheezing or rhonchi  ABDOMEN: Soft, non-tender, non-distended MUSCULOSKELETAL:  No edema; No deformity  SKIN: Warm and dry NEUROLOGIC:  Alert and oriented x 3 PSYCHIATRIC:  Normal affect   ASSESSMENT:    1. Palpitations   2. Gastroesophageal reflux disease, unspecified whether esophagitis present   3. Screening for hyperlipidemia    PLAN:    In order of problems listed above:  Palpitations, placed 2-week cardiac monitor to evaluate any significant arrhythmias.  It is possible her symptoms could be stress-induced.  She has not used her albuterol over the past 10 days. GERD, continue Pepcid.  Follow-up after cardiac monitor.      Medication Adjustments/Labs and Tests Ordered: Current medicines are reviewed at length with the patient today.  Concerns regarding medicines are outlined above.  Orders Placed This Encounter  Procedures   Lipid panel   LONG TERM MONITOR (3-14 DAYS)   EKG 12-Lead   No orders of the defined types were placed in this encounter.   Patient Instructions  Medication Instructions:  Your physician recommends that you continue on your current medications as directed. Please refer to the Current Medication list given to you today.  *If you need a refill on your cardiac medications before your next appointment, please call your  pharmacy*   Lab Work:  Your physician recommends that you return for a FASTING lipid profile: at your earliest [place] will need to be fasting. Please do not have anything to eat or drink after midnight the morning you have the lab work. You may only have water or black coffee with no cream or sugar.   Please return to our office on ________________at_______________am/pm    Testing/Procedures:  Your physician has recommended that you wear a Zio XT monitor for 2 weeks. This will be mailed to your home address in 4-5 business days.  This monitor is a medical device that records the hearts electrical activity. Doctors most often use these monitors to diagnose arrhythmias. Arrhythmias are problems with the speed or rhythm of the heartbeat. The monitor is a small device applied to your chest. You can wear one while you do your normal daily activities. While wearing this monitor if you have any symptoms to push the button and record what you felt. Once you have worn this monitor for the period of time provider prescribed (Usually 14 days), you will return the monitor device in the postage paid box. Once it is returned they will download the data collected and provide [place] with a report which the provider will then review and we will call you with those results. Important tips:  Avoid showering during the first 24 hours of wearing the monitor. Avoid excessive sweating to help maximize wear time. Do not submerge the device, no hot tubs, and no swimming pools. Keep any lotions or oils away from the patch. After 24 hours you may shower with the patch on. Take brief showers with your back facing the shower head.  Do not remove patch once it has been placed because that will interrupt data and decrease adhesive wear time. Push the button when you have any symptoms and write down what you were feeling. Once you have completed wearing your monitor, remove and place into box which has postage  paid and place in your outgoing mailbox.  If for some reason you have misplaced your box then call our office and we can provide another box and/or mail it off for you.  Follow-Up: At [facility], you and your health needs are our priority.  As part of our continuing mission to provide you with exceptional heart care, we have created designated Provider Care Teams.  These Care Teams include your primary Cardiologist (physician) and Advanced Practice Providers (APPs -  Physician Assistants and Nurse Practitioners) who all work together to provide you with the care you need, when you need it.  We recommend signing up for the patient portal called "MyChart".  Sign up information is provided on this After Visit Summary.  MyChart is used to connect with patients for Virtual Visits (Telemedicine).  Patients are able to view lab/test results, encounter notes, upcoming appointments, etc.  Non-urgent messages can be sent to your provider as well.   To learn more about what you can do with MyChart, go to [email or web address].    Your next appointment:   6 week(s)  The format for your next appointment:   In Person  Provider:   You may see Kate Sable, MD or one of the following Advanced Practice Providers on your designated Care Team:   Murray Hodgkins, NP Christell Faith, PA-C Cadence Kathlen Mody, [place]    Other Instructions     Signed, Kate Sable, MD  03/16/2021 4:40 PM    [place]

## 2021-03-16 NOTE — Patient Instructions (Addendum)
Medication Instructions:  Your physician recommends that you continue on your current medications as directed. Please refer to the Current Medication list given to you today.  *If you need a refill on your cardiac medications before your next appointment, please call your pharmacy*   Lab Work:  Your physician recommends that you return for a FASTING lipid profile: at your earliest [place] will need to be fasting. Please do not have anything to eat or drink after midnight the morning you have the lab work. You may only have water or black coffee with no cream or sugar.   Please return to our office on ________________at_______________am/pm    Testing/Procedures:  Your physician has recommended that you wear a Zio XT monitor for 2 weeks. This will be mailed to your home address in 4-5 business days.  This monitor is a medical device that records the hearts electrical activity. Doctors most often use these monitors to diagnose arrhythmias. Arrhythmias are problems with the speed or rhythm of the heartbeat. The monitor is a small device applied to your chest. You can wear one while you do your normal daily activities. While wearing this monitor if you have any symptoms to push the button and record what you felt. Once you have worn this monitor for the period of time provider prescribed (Usually 14 days), you will return the monitor device in the postage paid box. Once it is returned they will download the data collected and provide [place] with a report which the provider will then review and we will call you with those results. Important tips:  Avoid showering during the first 24 hours of wearing the monitor. Avoid excessive sweating to help maximize wear time. Do not submerge the device, no hot tubs, and no swimming pools. Keep any lotions or oils away from the patch. After 24 hours you may shower with the patch on. Take brief showers with your back facing the shower head.  Do not  remove patch once it has been placed because that will interrupt data and decrease adhesive wear time. Push the button when you have any symptoms and write down what you were feeling. Once you have completed wearing your monitor, remove and place into box which has postage paid and place in your outgoing mailbox.  If for some reason you have misplaced your box then call our office and we can provide another box and/or mail it off for you.      Follow-Up: At [facility], you and your health needs are our priority.  As part of our continuing mission to provide you with exceptional heart care, we have created designated Provider Care Teams.  These Care Teams include your primary Cardiologist (physician) and Advanced Practice Providers (APPs -  Physician Assistants and Nurse Practitioners) who all work together to provide you with the care you need, when you need it.  We recommend signing up for the patient portal called "MyChart".  Sign up information is provided on this After Visit Summary.  MyChart is used to connect with patients for Virtual Visits (Telemedicine).  Patients are able to view lab/test results, encounter notes, upcoming appointments, etc.  Non-urgent messages can be sent to your provider as well.   To learn more about what you can do with MyChart, go to [email or web address].    Your next appointment:   6 week(s)  The format for your next appointment:   In Person  Provider:   You may see Kate Sable, MD or one  of the following Advanced Practice Providers on your designated Care Team:   Murray Hodgkins, NP Christell Faith, PA-C Cadence Kathlen Mody, [place]    Other Instructions

## 2021-03-19 ENCOUNTER — Other Ambulatory Visit: Payer: Medicare Other

## 2021-03-19 ENCOUNTER — Other Ambulatory Visit: Payer: Self-pay

## 2021-03-19 ENCOUNTER — Other Ambulatory Visit (INDEPENDENT_AMBULATORY_CARE_PROVIDER_SITE_OTHER): Payer: Medicare Other

## 2021-03-19 DIAGNOSIS — R002 Palpitations: Secondary | ICD-10-CM

## 2021-03-19 DIAGNOSIS — Z13220 Encounter for screening for lipoid disorders: Secondary | ICD-10-CM

## 2021-03-20 LAB — LIPID PANEL
Chol/HDL Ratio: 3.3 ratio (ref 0.0–4.4)
Cholesterol, Total: 185 mg/dL (ref 100–199)
HDL: 56 mg/dL (ref >39)
LDL Chol Calc (NIH): 113 mg/dL — ABNORMAL HIGH (ref 0–99)
Triglycerides: 89 mg/dL (ref 0–149)
VLDL Cholesterol Cal: 16 mg/dL (ref 5–40)

## 2021-04-16 ENCOUNTER — Encounter: Payer: Self-pay | Admitting: Cardiology

## 2021-04-17 ENCOUNTER — Encounter: Payer: Self-pay | Admitting: Family

## 2021-04-26 ENCOUNTER — Ambulatory Visit: Payer: Medicare Other | Admitting: Orthopaedic Surgery

## 2021-04-27 ENCOUNTER — Other Ambulatory Visit: Payer: Self-pay

## 2021-04-27 ENCOUNTER — Ambulatory Visit (INDEPENDENT_AMBULATORY_CARE_PROVIDER_SITE_OTHER): Payer: Medicare Other | Admitting: Cardiology

## 2021-04-27 ENCOUNTER — Encounter: Payer: Self-pay | Admitting: Cardiology

## 2021-04-27 VITALS — BP 120/80 | HR 90 | Ht 65.75 in | Wt 147.0 lb

## 2021-04-27 DIAGNOSIS — K219 Gastro-esophageal reflux disease without esophagitis: Secondary | ICD-10-CM | POA: Diagnosis not present

## 2021-04-27 DIAGNOSIS — R002 Palpitations: Secondary | ICD-10-CM | POA: Diagnosis not present

## 2021-04-27 NOTE — Progress Notes (Signed)
?Cardiology Office Note:   ? ?Date:  04/27/2021  ? ?ID:  Lynn Peters, DOB 05/27/1955, MRN [id number] ? ?PCP:  Burnard Hawthorne, FNP ?  ?[facility] Providers ?Cardiologist:  None    ? ?Referring MD: Burnard Hawthorne, FNP  ? ?Chief Complaint  ?Patient presents with  ? Follow-up  ?  F/U after wearing ZIO monitor  ? ? ?History of Present Illness:   ? ?Lynn Peters is a 66 y.o. female with a hx of GERD, asthma who presents for follow-up.  Previously seen due to palpitations.  Also states being stressful, not sure if symptoms of palpitations were stress-induced.  Taking deep breaths helps with symptoms.  Cardiac monitor was placed to evaluate any significant arrhythmias.  Presents for follow-up after wearing cardiac monitor. ? ? ?Past Medical History:  ?Diagnosis Date  ? Allergy   ? Anxiety   ? Arthritis   ? Asthma   ? Chest pain   ? Chicken pox   ? Complication of anesthesia   ? Difficulty breathing after GYN procedure  ? Diverticulitis 2009  ? GERD (gastroesophageal reflux disease)   ? Pneumonia   ? ? ?Past Surgical History:  ?Procedure Laterality Date  ? ABDOMINAL HYSTERECTOMY  2004  ? partial; has cervix and ovaries per patient; done for fibroids, noncancerous  ? ANTERIOR CRUCIATE LIGAMENT REPAIR Right 2000  ? COLONOSCOPY WITH PROPOFOL N/A 04/28/2019  ? Procedure: COLONOSCOPY WITH PROPOFOL;  Surgeon: Lin Landsman, MD;  Location: [facility] ENDOSCOPY;  Service: Gastroenterology;  Laterality: N/A;  ? ESOPHAGOGASTRODUODENOSCOPY (EGD) WITH PROPOFOL N/A 04/28/2019  ? Procedure: ESOPHAGOGASTRODUODENOSCOPY (EGD) WITH PROPOFOL;  Surgeon: Lin Landsman, MD;  Location: [facility] ENDOSCOPY;  Service: Gastroenterology;  Laterality: N/A;  ? LAPAROSCOPY    ? TOTAL KNEE ARTHROPLASTY Right 06/02/2020  ? Procedure: RIGHT TOTAL KNEE ARTHROPLASTY;  Surgeon: Mcarthur Rossetti, MD;  Location: [facility] ORS;  Service: Orthopedics;  Laterality: Right;  Needs RNFA  ? UTERINE FIBROID SURGERY    ? ? ?Current Medications: ?Current Meds   ?Medication Sig  ? albuterol (VENTOLIN HFA) 108 (90 Base) MCG/ACT inhaler INHALE 2 PUFFS INTO THE LUNGS EVERY 4 (FOUR) HOURS AS NEEDED FOR WHEEZING OR SHORTNESS OF BREATH.  ? Cholecalciferol (VITAMIN D3) 50 MCG (2000 UT) CAPS Take by mouth.  ? famotidine (PEPCID) 20 MG tablet Take 1 tablet (20 mg total) by mouth daily.  ? fluticasone (FLOVENT HFA) 110 MCG/ACT inhaler Inhale 2 puffs into the lungs 2 (two) times daily.  ? ipratropium (ATROVENT) 0.02 % nebulizer solution Take 2.5 mLs (0.5 mg total) by nebulization 2 (two) times daily as needed for wheezing or shortness of breath.  ? PRESCRIPTION MEDICATION Inhale 2 puffs into the lungs in the morning and at bedtime. Flixotide Inhaler  ? Probiotic Product (SOLUBLE FIBER/PROBIOTICS PO) Take 1 capsule by mouth daily.  ? TURMERIC PO Take by mouth. 1,'000mg'$  once a day  ? vitamin B-12 (CYANOCOBALAMIN) 1000 MCG tablet Take 1,000 mcg by mouth daily.  ?  ? ?Allergies:   Pantoprazole sodium, Tetracyclines & related, Breo ellipta [fluticasone furoate-vilanterol], and Flagyl [metronidazole]  ? ?Social History  ? ?Socioeconomic History  ? Marital status: Married  ?  Spouse name: Not on file  ? Number of children: Not on file  ? Years of education: Not on file  ? Highest education level: Not on file  ?Occupational History  ? Not on file  ?Tobacco Use  ? Smoking status: Never  ? Smokeless tobacco: Never  ?Vaping Use  ? Vaping Use:  Never used  ?Substance and Sexual Activity  ? Alcohol use: Yes  ?  Alcohol/week: 0.0 standard drinks  ?  Comment: rare  ? Drug use: No  ? Sexual activity: Yes  ?  Partners: Male  ?  Birth control/protection: Surgical  ?Other Topics Concern  ? Not on file  ?Social History Narrative  ? 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  ?   ? Married.  ?   ? Diet- gym, knee limits running, paddleboating. Interval training 3x per week.   ?   ? Diet-regular  ?   ? ?Social Determinants of Health  ? ?Financial Resource Strain: Not on  file  ?Food Insecurity: Not on file  ?Transportation Needs: Not on file  ?Physical Activity: Not on file  ?Stress: Not on file  ?Social Connections: Not on file  ?  ? ?Family History: ?The patient's family history includes Alcohol abuse in her brother; Alzheimer's disease in her mother; Asthma in her mother and sister; Diabetes in her father, mother, and sister; Heart disease in her brother and father; Hyperlipidemia in her father and mother; Hypertension in her mother; Stroke in her mother. There is no history of Breast cancer. ? ?ROS:   ?Please see the history of present illness.    ? All other systems reviewed and are negative. ? ?EKGs/Labs/Other Studies Reviewed:   ? ?The following studies were reviewed today: ? ? ?EKG:  EKG not  ordered today.  ? ?Recent Labs: ?05/03/2020: ALT 16; TSH 2.42 ?03/13/2021: BUN 10; Creatinine, Ser 0.74; Hemoglobin 15.3; Platelets 312; Potassium 3.5; Sodium 139  ?Recent Lipid Panel ?   ?Component Value Date/Time  ? CHOL 185 03/19/2021 0859  ? TRIG 89 03/19/2021 0859  ? HDL 56 03/19/2021 0859  ? CHOLHDL 3.3 03/19/2021 0859  ? CHOLHDL 4 05/03/2020 1424  ? VLDL 21.4 05/03/2020 1424  ? LDLCALC 113 (H) 03/19/2021 0859  ? ? ? ?Risk Assessment/Calculations:   ? ? ?    ? ?Physical Exam:   ? ?VS:  BP 120/80 (BP Location: Left Arm, Patient Position: Sitting, Cuff Size: Normal)   Pulse 90   Ht 5' 5.75" (1.67 m)   Wt 147 lb (66.7 kg)   SpO2 98%   BMI 23.91 kg/m?    ? ?Wt Readings from Last 3 Encounters:  ?04/27/21 147 lb (66.7 kg)  ?03/16/21 147 lb (66.7 kg)  ?03/13/21 147 lb (66.7 kg)  ?  ? ?GEN:  Well nourished, well developed in no acute distress ?HEENT: Normal ?NECK: No JVD; No carotid bruits ?CARDIAC: RRR, no murmurs, rubs, gallops ?RESPIRATORY:  Clear to auscultation without rales, wheezing or rhonchi  ?ABDOMEN: Soft, non-tender, non-distended ?MUSCULOSKELETAL:  No edema; No deformity  ?SKIN: Warm and dry ?NEUROLOGIC:  Alert and oriented x 3 ?PSYCHIATRIC:  Normal affect  ? ?ASSESSMENT:    ? ?1. Palpitations   ?2. Gastroesophageal reflux disease, unspecified whether esophagitis present   ? ? ?PLAN:   ? ?In order of problems listed above: ? ?Palpitations, occasional paroxysmal SVT, PVCs noted.  No significant arrhythmias, AV nodal agents discussed with patient, patient would like to monitor symptoms off any medications at this time.  I think this is a reasonable approach. ?GERD, continue Pepcid. ? ?Follow-up as needed ? ?   ? ?Medication Adjustments/Labs and Tests Ordered: ?Current medicines are reviewed at length with the patient today.  Concerns regarding medicines are outlined above.  ?No orders of the defined types were  placed in this encounter. ? ?No orders of the defined types were placed in this encounter. ? ? ?Patient Instructions  ?Medication Instructions:  ?Your physician recommends that you continue on your current medications as directed. Please refer to the Current Medication list given to you today. ? ?*If you need a refill on your cardiac medications before your next appointment, please call your pharmacy* ? ? ?Lab Work: ?None ordered ?If you have labs (blood work) drawn today and your tests are completely normal, you will receive your results only by: ?MyChart Message (if you have MyChart) OR ?A paper copy in the mail ?If you have any lab test that is abnormal or we need to change your treatment, we will call you to review the results. ? ? ?Testing/Procedures: ?None ordered ? ? ?Follow-Up: ?At [facility], you and your health needs are our priority.  As part of our continuing mission to provide you with exceptional heart care, we have created designated Provider Care Teams.  These Care Teams include your primary Cardiologist (physician) and Advanced Practice Providers (APPs -  Physician Assistants and Nurse Practitioners) who all work together to provide you with the care you need, when you need it. ? ?We recommend signing up for the patient portal called "MyChart".  Sign up  information is provided on this After Visit Summary.  MyChart is used to connect with patients for Virtual Visits (Telemedicine).  Patients are able to view lab/test results, encounter notes, upcoming appointments, etc

## 2021-04-27 NOTE — Patient Instructions (Signed)

## 2021-04-30 ENCOUNTER — Ambulatory Visit: Payer: Medicare Other | Admitting: Orthopaedic Surgery

## 2021-05-02 ENCOUNTER — Telehealth: Payer: Self-pay | Admitting: Orthopaedic Surgery

## 2021-05-02 ENCOUNTER — Telehealth: Payer: Self-pay

## 2021-05-02 NOTE — Telephone Encounter (Signed)
Can we call and get her rescheduled from Monday when [facility] wasn't here? ?

## 2021-05-02 NOTE — Telephone Encounter (Signed)
Called patient left message to return call to reschedule her appointment with Dr. Ninfa Linden ?

## 2021-05-04 ENCOUNTER — Other Ambulatory Visit: Payer: Self-pay

## 2021-05-04 ENCOUNTER — Encounter: Payer: Self-pay | Admitting: Family

## 2021-05-04 ENCOUNTER — Ambulatory Visit (INDEPENDENT_AMBULATORY_CARE_PROVIDER_SITE_OTHER): Payer: Medicare Other | Admitting: Family

## 2021-05-04 VITALS — BP 124/80 | HR 76 | Temp 98.4°F | Ht 65.72 in | Wt 147.0 lb

## 2021-05-04 DIAGNOSIS — D751 Secondary polycythemia: Secondary | ICD-10-CM | POA: Diagnosis not present

## 2021-05-04 DIAGNOSIS — Z0000 Encounter for general adult medical examination without abnormal findings: Secondary | ICD-10-CM

## 2021-05-04 DIAGNOSIS — E785 Hyperlipidemia, unspecified: Secondary | ICD-10-CM | POA: Diagnosis not present

## 2021-05-04 DIAGNOSIS — R5383 Other fatigue: Secondary | ICD-10-CM | POA: Diagnosis not present

## 2021-05-04 DIAGNOSIS — J4530 Mild persistent asthma, uncomplicated: Secondary | ICD-10-CM

## 2021-05-04 DIAGNOSIS — Z833 Family history of diabetes mellitus: Secondary | ICD-10-CM

## 2021-05-04 LAB — CBC WITH DIFFERENTIAL/PLATELET
Basophils Absolute: 0.1 10*3/uL (ref 0.0–0.1)
Basophils Relative: 0.8 % (ref 0.0–3.0)
Eosinophils Absolute: 0.1 10*3/uL (ref 0.0–0.7)
Eosinophils Relative: 1.9 % (ref 0.0–5.0)
HCT: 43.5 % (ref 36.0–46.0)
Hemoglobin: 14.5 g/dL (ref 12.0–15.0)
Lymphocytes Relative: 33.2 % (ref 12.0–46.0)
Lymphs Abs: 2.2 10*3/uL (ref 0.7–4.0)
MCHC: 33.2 g/dL (ref 30.0–36.0)
MCV: 88.8 fl (ref 78.0–100.0)
Monocytes Absolute: 0.5 10*3/uL (ref 0.1–1.0)
Monocytes Relative: 8.3 % (ref 3.0–12.0)
Neutro Abs: 3.7 10*3/uL (ref 1.4–7.7)
Neutrophils Relative %: 55.8 % (ref 43.0–77.0)
Platelets: 331 10*3/uL (ref 150.0–400.0)
RBC: 4.9 Mil/uL (ref 3.87–5.11)
RDW: 13.6 % (ref 11.5–15.5)
WBC: 6.6 10*3/uL (ref 4.0–10.5)

## 2021-05-04 LAB — COMPREHENSIVE METABOLIC PANEL
ALT: 14 U/L (ref 0–35)
AST: 18 U/L (ref 0–37)
Albumin: 4.2 g/dL (ref 3.5–5.2)
Alkaline Phosphatase: 64 U/L (ref 39–117)
BUN: 11 mg/dL (ref 6–23)
CO2: 30 mEq/L (ref 19–32)
Calcium: 9.6 mg/dL (ref 8.4–10.5)
Chloride: 103 mEq/L (ref 96–112)
Creatinine, Ser: 0.81 mg/dL (ref 0.40–1.20)
GFR: 75.79 mL/min (ref >60.00)
Glucose, Bld: 87 mg/dL (ref 70–99)
Potassium: 3.8 mEq/L (ref 3.5–5.1)
Sodium: 137 mEq/L (ref 135–145)
Total Bilirubin: 0.6 mg/dL (ref 0.2–1.2)
Total Protein: 7.5 g/dL (ref 6.0–8.3)

## 2021-05-04 MED ORDER — FLUTICASONE PROPIONATE HFA 110 MCG/ACT IN AERO: 1.0000 | INHALATION_SPRAY | Freq: Two times a day (BID) | RESPIRATORY_TRACT | 4 refills | Status: DC

## 2021-05-04 NOTE — Progress Notes (Signed)
? ?Subjective:  ? ? Patient ID: Lynn Peters, female    DOB: Mar 13, 1955, 66 y.o.   MRN: [id number] ? ?CC: Lynn Peters is a 66 y.o. female who presents today for physical exam and follow up.   ? ?HPI: Feels well today ?No new complaints ? ?Asthma- she is compliant with flovent. No cough, sob, wheezing ? ? ?Palpitations has resolved with limiting caffeine and flovent to one puff BID ?She is eating healthier and doing intermittent fasting.  ? ?Colorectal Cancer Screening: UTD , repeat in 7 years ?Breast Cancer Screening: Mammogram UTD ?Cervical Cancer Screening: due; last Pap 2020, negative malignancy, HPV.  She declines cervical cancer screening this year and would like to repeat next year ?Bone Health screening/DEXA for 65+: UTD ? ?      Tetanus - due ?       Pneumococcal - Complete ? ?Labs: Screening labs today. ?Exercise: Gets regular exercise.   ?Alcohol use:  none ?Smoking/tobacco use: Nonsmoker.   ? ? ?HISTORY:  ?Past Medical History:  ?Diagnosis Date  ? Allergy   ? Anxiety   ? Arthritis   ? Asthma   ? Chest pain   ? Chicken pox   ? Complication of anesthesia   ? Difficulty breathing after GYN procedure  ? Diverticulitis 2009  ? GERD (gastroesophageal reflux disease)   ? Pneumonia   ?  ?Past Surgical History:  ?Procedure Laterality Date  ? ABDOMINAL HYSTERECTOMY  2004  ? partial; has cervix and ovaries per patient; done for fibroids, noncancerous  ? ANTERIOR CRUCIATE LIGAMENT REPAIR Right 2000  ? COLONOSCOPY WITH PROPOFOL N/A 04/28/2019  ? Procedure: COLONOSCOPY WITH PROPOFOL;  Surgeon: Lin Landsman, MD;  Location: [facility] ENDOSCOPY;  Service: Gastroenterology;  Laterality: N/A;  ? ESOPHAGOGASTRODUODENOSCOPY (EGD) WITH PROPOFOL N/A 04/28/2019  ? Procedure: ESOPHAGOGASTRODUODENOSCOPY (EGD) WITH PROPOFOL;  Surgeon: Lin Landsman, MD;  Location: [facility] ENDOSCOPY;  Service: Gastroenterology;  Laterality: N/A;  ? LAPAROSCOPY    ? TOTAL KNEE ARTHROPLASTY Right 06/02/2020  ? Procedure: RIGHT TOTAL KNEE ARTHROPLASTY;   Surgeon: Mcarthur Rossetti, MD;  Location: [facility] ORS;  Service: Orthopedics;  Laterality: Right;  Needs RNFA  ? UTERINE FIBROID SURGERY    ? ?Family History  ?Problem Relation Age of Onset  ? Alzheimer's disease Mother   ? Asthma Mother   ? Diabetes Mother   ? Hyperlipidemia Mother   ? Hypertension Mother   ? Stroke Mother 21  ? Heart disease Father   ?     CHF  ? Diabetes Father   ? Hyperlipidemia Father   ? Asthma Sister   ? Diabetes Sister   ? Alcohol abuse Brother   ? Heart disease Brother   ? Breast cancer Neg Hx   ? ?  ? ?ALLERGIES: Pantoprazole sodium, Tetracyclines & related, Breo ellipta [fluticasone furoate-vilanterol], and Flagyl [metronidazole] ? ?Current Outpatient Medications on File Prior to Visit  ?Medication Sig Dispense Refill  ? albuterol (VENTOLIN HFA) 108 (90 Base) MCG/ACT inhaler INHALE 2 PUFFS INTO THE LUNGS EVERY 4 (FOUR) HOURS AS NEEDED FOR WHEEZING OR SHORTNESS OF BREATH. 18 g 2  ? Cholecalciferol (VITAMIN D3) 50 MCG (2000 UT) CAPS Take by mouth.    ? famotidine (PEPCID) 20 MG tablet Take 1 tablet (20 mg total) by mouth daily. 30 tablet 2  ? PRESCRIPTION MEDICATION Inhale 2 puffs into the lungs in the morning and at bedtime. Flixotide Inhaler    ? Probiotic Product (SOLUBLE FIBER/PROBIOTICS PO) Take 1 capsule by mouth daily.    ?  TURMERIC PO Take by mouth. 1,'000mg'$  once a day    ? vitamin B-12 (CYANOCOBALAMIN) 1000 MCG tablet Take 1,000 mcg by mouth daily.    ? ?No current facility-administered medications on file prior to visit.  ? ? ?Social History  ? ?Tobacco Use  ? Smoking status: Never  ? Smokeless tobacco: Never  ?Vaping Use  ? Vaping Use: Never used  ?Substance Use Topics  ? Alcohol use: Yes  ?  Alcohol/week: 0.0 standard drinks  ?  Comment: rare  ? Drug use: No  ? ? ?Review of Systems  ?Constitutional:  Negative for chills, fever and unexpected weight change.  ?HENT:  Negative for congestion.   ?Respiratory:  Negative for cough, shortness of breath and wheezing.   ?Cardiovascular:   Negative for chest pain, palpitations (resolved) and leg swelling.  ?Gastrointestinal:  Negative for nausea and vomiting.  ?Musculoskeletal:  Negative for arthralgias and myalgias.  ?Skin:  Negative for rash.  ?Neurological:  Negative for headaches.  ?Hematological:  Negative for adenopathy.  ?Psychiatric/Behavioral:  Negative for confusion.   ?   ?Objective:  ?  ?BP 124/80 (BP Location: Left Arm, Patient Position: Sitting, Cuff Size: Normal)   Pulse 76   Temp 98.4 ?F (36.9 ?C) (Oral)   Ht 5' 5.72" (1.669 m)   Wt 147 lb (66.7 kg)   SpO2 98%   BMI 23.93 kg/m?  ? ?BP Readings from Last 3 Encounters:  ?05/04/21 124/80  ?04/27/21 120/80  ?03/16/21 124/74  ? ?Wt Readings from Last 3 Encounters:  ?05/04/21 147 lb (66.7 kg)  ?04/27/21 147 lb (66.7 kg)  ?03/16/21 147 lb (66.7 kg)  ? ? ?Physical Exam ?Vitals reviewed.  ?Constitutional:   ?   Appearance: Normal appearance. She is well-developed.  ?Eyes:  ?   Conjunctiva/sclera: Conjunctivae normal.  ?Neck:  ?   Thyroid: No thyroid mass or thyromegaly.  ?Cardiovascular:  ?   Rate and Rhythm: Normal rate and regular rhythm.  ?   Pulses: Normal pulses.  ?   Heart sounds: Normal heart sounds.  ?Pulmonary:  ?   Effort: Pulmonary effort is normal.  ?   Breath sounds: Normal breath sounds. No wheezing, rhonchi or rales.  ?Chest:  ?Breasts: ?   Breasts are symmetrical.  ?   Right: No inverted nipple, mass, nipple discharge, skin change or tenderness.  ?   Left: No inverted nipple, mass, nipple discharge, skin change or tenderness.  ?Abdominal:  ?   General: Bowel sounds are normal. There is no distension.  ?   Palpations: Abdomen is soft. Abdomen is not rigid. There is no fluid wave or mass.  ?   Tenderness: There is no abdominal tenderness. There is no guarding or rebound.  ?Lymphadenopathy:  ?   Head:  ?   Right side of head: No submental, submandibular, tonsillar, preauricular, posterior auricular or occipital adenopathy.  ?   Left side of head: No submental, submandibular,  tonsillar, preauricular, posterior auricular or occipital adenopathy.  ?   Cervical: No cervical adenopathy.  ?   Right cervical: No superficial, deep or posterior cervical adenopathy. ?   Left cervical: No superficial, deep or posterior cervical adenopathy.  ?Skin: ?   General: Skin is warm and dry.  ?Neurological:  ?   Mental Status: She is alert.  ?Psychiatric:     ?   Speech: Speech normal.     ?   Behavior: Behavior normal.     ?   Thought Content: Thought content normal.  ? ? ?   ?  Assessment & Plan:  ? ?Problem List Items Addressed This Visit   ? ?  ? Respiratory  ? Mild persistent asthma  ?  Chronic, symptomatically stable at this time.  Continue Flovent 1 puff twice daily ?  ?  ? Relevant Medications  ? fluticasone (FLOVENT HFA) 110 MCG/ACT inhaler  ?  ? Other  ? Erythrocytosis  ? Relevant Orders  ? CBC with Differential/Platelet  ? Family history of diabetes mellitus  ? Fatigue - Primary  ? Relevant Orders  ? TSH  ? Comprehensive metabolic panel  ? Hyperlipidemia LDL goal <160  ? Relevant Orders  ? Comprehensive metabolic panel  ? Routine physical examination  ?  Clinical breast exam performed.  Patient declines cervical cancer screening today, we will perform next year.  Encouraged continued exercise ?  ?  ? ? ? ?I have discontinued Lynn Peters's ipratropium. I have also changed her fluticasone. Additionally, I am having her maintain her Probiotic Product (SOLUBLE FIBER/PROBIOTICS PO), famotidine, albuterol, PRESCRIPTION MEDICATION, vitamin D3, vitamin B-12, and TURMERIC PO. ? ? ?Meds ordered this encounter  ?Medications  ? fluticasone (FLOVENT HFA) 110 MCG/ACT inhaler  ?  Sig: Inhale 1 puff into the lungs 2 (two) times daily.  ?  Dispense:  1 each  ?  Refill:  4  ?  Order Specific Question:   Supervising Provider  ?  Answer:   Crecencio Mc [[id number]]  ? ? ?Return precautions given.  ? ?Risks, benefits, and alternatives of the medications and treatment plan prescribed today were discussed, and patient  expressed understanding.  ? ?Education regarding symptom management and diagnosis given to patient on AVS.  ? ?Continue to follow with Burnard Hawthorne, FNP for routine health maintenance.  ? ?Lynn Peters and

## 2021-05-04 NOTE — Assessment & Plan Note (Signed)
Clinical breast exam performed.  Patient declines cervical cancer screening today, we will perform next year.  Encouraged continued exercise ?

## 2021-05-04 NOTE — Patient Instructions (Signed)
Please call the office in 6 months to arrange a lipid panel. ?Always a pleasure to see you! ?Health Maintenance for Postmenopausal Women ?Menopause is a normal process in which your ability to get pregnant comes to an end. This process happens slowly over many months or years, usually between the ages of 61 and 63. Menopause is complete when you have missed your menstrual period for 12 months. ?It is important to talk with your health care provider about some of the most common conditions that affect women after menopause (postmenopausal women). These include heart disease, cancer, and bone loss (osteoporosis). Adopting a healthy lifestyle and getting preventive care can help to promote your health and wellness. The actions you take can also lower your chances of developing some of these common conditions. ?What are the signs and symptoms of menopause? ?During menopause, you may have the following symptoms: ?Hot flashes. These can be moderate or severe. ?Night sweats. ?Decrease in sex drive. ?Mood swings. ?Headaches. ?Tiredness (fatigue). ?Irritability. ?Memory problems. ?Problems falling asleep or staying asleep. ?Talk with your health care provider about treatment options for your symptoms. ?Do I need hormone replacement therapy? ?Hormone replacement therapy is effective in treating symptoms that are caused by menopause, such as hot flashes and night sweats. ?Hormone replacement carries certain risks, especially as you become older. If you are thinking about using estrogen or estrogen with progestin, discuss the benefits and risks with your health care provider. ?How can I reduce my risk for heart disease and stroke? ?The risk of heart disease, heart attack, and stroke increases as you age. One of the causes may be a change in the body's hormones during menopause. This can affect how your body uses dietary fats, triglycerides, and cholesterol. Heart attack and stroke are medical emergencies. There are many things  that you can do to help prevent heart disease and stroke. ?Watch your blood pressure ?High blood pressure causes heart disease and increases the risk of stroke. This is more likely to develop in people who have high blood pressure readings or are overweight. ?Have your blood pressure checked: ?Every 3-5 years if you are 41-79 years of age. ?Every year if you are 81 years old or older. ?Eat a healthy diet ? ?Eat a diet that includes plenty of vegetables, fruits, low-fat dairy products, and lean protein. ?Do not eat a lot of foods that are high in solid fats, added sugars, or sodium. ?Get regular exercise ?Get regular exercise. This is one of the most important things you can do for your health. Most adults should: ?Try to exercise for at least 150 minutes each week. The exercise should increase your heart rate and make you sweat (moderate-intensity exercise). ?Try to do strengthening exercises at least twice each week. Do these in addition to the moderate-intensity exercise. ?Spend less time sitting. Even light physical activity can be beneficial. ?Other tips ?Work with your health care provider to achieve or maintain a healthy weight. ?Do not use any products that contain nicotine or tobacco. These products include cigarettes, chewing tobacco, and vaping devices, such as e-cigarettes. If you need help quitting, ask your health care provider. ?Know your numbers. Ask your health care provider to check your cholesterol and your blood sugar (glucose). Continue to have your blood tested as directed by your health care provider. ?Do I need screening for cancer? ?Depending on your health history and family history, you may need to have cancer screenings at different stages of your life. This may include screening for: ?Breast  cancer. ?Cervical cancer. ?Lung cancer. ?Colorectal cancer. ?What is my risk for osteoporosis? ?After menopause, you may be at increased risk for osteoporosis. Osteoporosis is a condition in which  bone destruction happens more quickly than new bone creation. To help prevent osteoporosis or the bone fractures that can happen because of osteoporosis, you may take the following actions: ?If you are 17-33 years old, get at least 1,000 mg of calcium and at least 600 international units (IU) of vitamin D per day. ?If you are older than age 42 but younger than age 55, get at least 1,200 mg of calcium and at least 600 international units (IU) of vitamin D per day. ?If you are older than age 75, get at least 1,200 mg of calcium and at least 800 international units (IU) of vitamin D per day. ?Smoking and drinking excessive alcohol increase the risk of osteoporosis. Eat foods that are rich in calcium and vitamin D, and do weight-bearing exercises several times each week as directed by your health care provider. ?How does menopause affect my mental health? ?Depression may occur at any age, but it is more common as you become older. Common symptoms of depression include: ?Feeling depressed. ?Changes in sleep patterns. ?Changes in appetite or eating patterns. ?Feeling an overall lack of motivation or enjoyment of activities that you previously enjoyed. ?Frequent crying spells. ?Talk with your health care provider if you think that you are experiencing any of these symptoms. ?General instructions ?See your health care provider for regular wellness exams and vaccines. This may include: ?Scheduling regular health, dental, and eye exams. ?Getting and maintaining your vaccines. These include: ?Influenza vaccine. Get this vaccine each year before the flu season begins. ?Pneumonia vaccine. ?Shingles vaccine. ?Tetanus, diphtheria, and pertussis (Tdap) booster vaccine. ?Your health care provider may also recommend other immunizations. ?Tell your health care provider if you have ever been abused or do not feel safe at home. ?Summary ?Menopause is a normal process in which your ability to get pregnant comes to an end. ?This condition  causes hot flashes, night sweats, decreased interest in sex, mood swings, headaches, or lack of sleep. ?Treatment for this condition may include hormone replacement therapy. ?Take actions to keep yourself healthy, including exercising regularly, eating a healthy diet, watching your weight, and checking your blood pressure and blood sugar levels. ?Get screened for cancer and depression. Make sure that you are up to date with all your vaccines. ?This information is not intended to replace advice given to you by your health care provider. Make sure you discuss any questions you have with your health care provider. ?Document Revised: 06/19/2020 Document Reviewed: 06/19/2020 ?Elsevier Patient Education ? [place]. ? ?

## 2021-05-04 NOTE — Assessment & Plan Note (Signed)
Chronic, symptomatically stable at this time.  Continue Flovent 1 puff twice daily ?

## 2021-05-07 ENCOUNTER — Telehealth: Payer: Self-pay | Admitting: Family

## 2021-05-07 ENCOUNTER — Other Ambulatory Visit: Payer: Self-pay

## 2021-05-07 DIAGNOSIS — E785 Hyperlipidemia, unspecified: Secondary | ICD-10-CM

## 2021-05-07 NOTE — Telephone Encounter (Signed)
Pt requesting lab... Pt stated that she needs a lipid panel... No orders in system... pt requesting callback...  ?

## 2021-05-08 ENCOUNTER — Other Ambulatory Visit: Payer: Self-pay

## 2021-05-08 DIAGNOSIS — E785 Hyperlipidemia, unspecified: Secondary | ICD-10-CM

## 2021-05-08 LAB — TSH: TSH: 2.07 u[IU]/mL (ref 0.35–5.50)

## 2021-05-08 NOTE — Telephone Encounter (Signed)
Labs are ordered 

## 2021-05-08 NOTE — Telephone Encounter (Signed)
Spoke to patient. Patient stated someone had already called her ?

## 2021-05-09 ENCOUNTER — Ambulatory Visit (INDEPENDENT_AMBULATORY_CARE_PROVIDER_SITE_OTHER): Payer: Medicare Other

## 2021-05-09 VITALS — Ht 65.72 in | Wt 147.0 lb

## 2021-05-09 DIAGNOSIS — Z0000 Encounter for general adult medical examination without abnormal findings: Secondary | ICD-10-CM

## 2021-05-09 NOTE — Progress Notes (Signed)
Subjective:   Lynn Peters is a 66 y.o. female who presents for an Initial Medicare Annual Wellness Visit.  Review of Systems    No ROS.  Medicare Wellness Virtual Visit.  Visual/audio telehealth visit, UTA vital signs.   See social history for additional risk factors.         Objective:    Today's Vitals   05/09/21 1130  Weight: 147 lb (66.7 kg)  Height: 5' 5.72" (1.669 m)   Body mass index is 23.93 kg/m.     05/09/2021   11:33 AM 03/13/2021   12:10 PM 01/11/2021    8:55 PM 11/14/2020    1:42 PM 06/02/2020   11:05 AM 06/02/2020    5:53 AM 05/24/2020   10:55 AM  Advanced Directives  Does Patient Have a Medical Advance Directive? Yes Yes No Yes Yes Yes Yes  Type of Estate agent of [place];Living will Healthcare Power of [employer] of [place];Living will Healthcare Power of [place];Living will Healthcare Power of [place];Living will Healthcare Power of [place];Living will  Does patient want to make changes to medical advance directive? No - Patient declined   Yes (ED - Information included in AVS) No - Patient declined    Copy of Healthcare Power of Attorney in Chart? Yes - validated most recent copy scanned in chart (See row information)    No - copy requested No - copy requested No - copy requested  Would patient like information on creating a medical advance directive?   No - Patient declined       Current Medications (verified) Outpatient Encounter Medications as of 05/09/2021  Medication Sig   albuterol (VENTOLIN HFA) 108 (90 Base) MCG/ACT inhaler INHALE 2 PUFFS INTO THE LUNGS EVERY 4 (FOUR) HOURS AS NEEDED FOR WHEEZING OR SHORTNESS OF BREATH.   Cholecalciferol (VITAMIN D3) 50 MCG (2000 UT) CAPS Take by mouth.   famotidine (PEPCID) 20 MG tablet Take 1 tablet (20 mg total) by mouth daily.   fluticasone (FLOVENT HFA) 110 MCG/ACT inhaler Inhale 1 puff into the lungs 2 (two) times daily.   PRESCRIPTION MEDICATION Inhale 2 puffs into the  lungs in the morning and at bedtime. Flixotide Inhaler   Probiotic Product (SOLUBLE FIBER/PROBIOTICS PO) Take 1 capsule by mouth daily.   TURMERIC PO Take by mouth. 1,000mg  once a day   vitamin B-12 (CYANOCOBALAMIN) 1000 MCG tablet Take 1,000 mcg by mouth daily.   No facility-administered encounter medications on file as of 05/09/2021.   Allergies (verified) Pantoprazole sodium, Tetracyclines & related, Breo ellipta [fluticasone furoate-vilanterol], and Flagyl [metronidazole]   History: Past Medical History:  Diagnosis Date   Allergy    Anxiety    Arthritis    Asthma    Chest pain    Chicken pox    Complication of anesthesia    Difficulty breathing after GYN procedure   Diverticulitis 2009   GERD (gastroesophageal reflux disease)    Pneumonia    Past Surgical History:  Procedure Laterality Date   ABDOMINAL HYSTERECTOMY  2004   partial; has cervix and ovaries per patient; done for fibroids, noncancerous   ANTERIOR CRUCIATE LIGAMENT REPAIR Right 2000   COLONOSCOPY WITH PROPOFOL N/A 04/28/2019   Procedure: COLONOSCOPY WITH PROPOFOL;  Surgeon: Toney Reil, MD;  Location: [facility] ENDOSCOPY;  Service: Gastroenterology;  Laterality: N/A;   ESOPHAGOGASTRODUODENOSCOPY (EGD) WITH PROPOFOL N/A 04/28/2019   Procedure: ESOPHAGOGASTRODUODENOSCOPY (EGD) WITH PROPOFOL;  Surgeon: Toney Reil, MD;  Location: [facility] ENDOSCOPY;  Service: Gastroenterology;  Laterality: N/A;   LAPAROSCOPY     TOTAL KNEE ARTHROPLASTY Right 06/02/2020   Procedure: RIGHT TOTAL KNEE ARTHROPLASTY;  Surgeon: Kathryne Hitch, MD;  Location: [facility] ORS;  Service: Orthopedics;  Laterality: Right;  Needs RNFA   UTERINE FIBROID SURGERY     Family History  Problem Relation Age of Onset   Alzheimer's disease Mother    Asthma Mother    Diabetes Mother    Hyperlipidemia Mother    Hypertension Mother    Stroke Mother 70   Heart disease Father        CHF   Diabetes Father    Hyperlipidemia Father    Asthma  Sister    Diabetes Sister    Alcohol abuse Brother    Heart disease Brother    Breast cancer Neg Hx    Social History   Socioeconomic History   Marital status: Married    Spouse name: Not on file   Number of children: Not on file   Years of education: Not on file   Highest education level: Not on file  Occupational History   Not on file  Tobacco Use   Smoking status: Never   Smokeless tobacco: Never  Vaping Use   Vaping Use: Never used  Substance and Sexual Activity   Alcohol use: Yes    Alcohol/week: 0.0 standard drinks    Comment: rare   Drug use: No   Sexual activity: Yes    Partners: Male    Birth control/protection: Surgical  Other Topics Concern   Not on file  Social History Narrative   Moved from [place] here 3 years ago. 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      Diet- gym, knee limits running, paddleboating. Interval training 3x per week.       Diet-regular      Social Determinants of Corporate investment banker Strain: Low Risk    Difficulty of Paying Living Expenses: Not hard at all  Food Insecurity: No Food Insecurity   Worried About Programme researcher, broadcasting/film/video in the Last Year: Never true   Barista in the Last Year: Never true  Transportation Needs: No Transportation Needs   Lack of Transportation (Medical): No   Lack of Transportation (Non-Medical): No  Physical Activity: Not on file  Stress: No Stress Concern Present   Feeling of Stress : Not at all  Social Connections: Not on file   Tobacco Counseling Counseling given: Not Answered  Clinical Intake:  Pre-visit preparation completed: Yes        Diabetes: No  How often do you need to have someone help you when you read instructions, pamphlets, or other written materials from your doctor or pharmacy?: 1 - Never   Interpreter Needed?: No      Activities of Daily Living    05/09/2021   11:35 AM 06/02/2020   11:10 AM  In your present state of health, do you  have any difficulty performing the following activities:  Hearing? 0   Vision? 0   Difficulty concentrating or making decisions? 0   Walking or climbing stairs? 0   Dressing or bathing? 0   Doing errands, shopping? 0 0  Preparing Food and eating ? N   Using the Toilet? N   In the past six months, have you accidently leaked urine? N   Do you have problems with loss of bowel control? N   Managing your  Medications? N   Managing your Finances? N   Housekeeping or managing your Housekeeping? N    Patient Care Team: Allegra Grana, FNP as PCP - General (Family Medicine) Debbe Odea, MD as Consulting Physician (Cardiology)  Indicate any recent Medical Services you may have received from other than [facility] providers in the past year (date may be approximate).     Assessment:   This is a routine wellness examination for Lynn Peters.  Virtual Visit via Telephone Note  I connected with  Lynn Peters on 05/09/21 at 11:30 AM EDT by telephone and verified that I am speaking with the correct person using two identifiers.  Persons participating in the virtual visit: patient/Nurse Health Advisor   I discussed the limitations of performing an evaluation and management service by telehealth. The patient expressed understanding and agreed to proceed. We continued and completed visit with audio only. Some vital signs may be absent or patient reported.   Hearing/Vision screen Hearing Screening - Comments:: Patient is able to hear conversational tones without difficulty.  No issues reported. Vision Screening - Comments:: Wears corrective lenses They have seen their ophthalmologist in the last 12 months.   Dietary issues and exercise activities discussed:   Intermittent diet Good water intake    Goals Addressed               This Visit's Progress     Patient Stated     Weight (lb) < 145 lb (65.8 kg) (pt-stated)   147 lb (66.7 kg)     I want to lose about 5lbs  Healthy diet Stay  active       Depression Screen    05/09/2021   11:35 AM 05/04/2021   10:15 AM 05/03/2020    2:33 PM 02/14/2017   12:36 PM 01/08/2016   10:21 AM 02/22/2015   11:01 AM  PHQ 2/9 Scores  PHQ - 2 Score 0 0 2 2 0 0  PHQ- 9 Score   4 5      Fall Risk    05/09/2021   11:34 AM 05/04/2021   10:15 AM 05/03/2020    1:46 PM 10/16/2018   12:02 PM 01/08/2016   10:21 AM  Fall Risk   Falls in the past year?  1 0 0 No  Number falls in past yr:  0     Injury with Fall?  1     Risk for fall due to :  History of fall(s)     Follow up Falls evaluation completed Falls evaluation completed Falls evaluation completed Falls evaluation completed     FALL RISK PREVENTION PERTAINING TO THE HOME: Home free of loose throw rugs in walkways, pet beds, electrical cords, etc? Yes  Adequate lighting in your home to reduce risk of falls? Yes   ASSISTIVE DEVICES UTILIZED TO PREVENT FALLS: Use of a cane, walker or w/c? No   TIMED UP AND GO: Was the test performed? No .   Cognitive Function:  Patient is alert and oriented x3.  Normal cognitive status assessed by direct observation/communication. No abnormalities found.       Immunizations Immunization History  Administered Date(s) Administered   Fluad Quad(high Dose 65+) 03/20/2020, 11/05/2020   Influenza,inj,Quad PF,6+ Mos 11/27/2016, 12/09/2017, 10/16/2018   PFIZER Comirnaty(Gray Top)Covid-19 Tri-Sucrose Vaccine 05/12/2020   PFIZER(Purple Top)SARS-COV-2 Vaccination 11/14/2019   PNEUMOCOCCAL CONJUGATE-20 05/03/2020   Pneumococcal Polysaccharide-23 09/27/2016   TDAP status: Due, Education has been provided regarding the importance of this vaccine. Advised may receive this  vaccine at local pharmacy or Health Dept. Aware to provide a copy of the vaccination record if obtained from local pharmacy or Health Dept. Verbalized acceptance and understanding.  Shingrix Completed?: No.    Education has been provided regarding the importance of this vaccine. Patient  has been advised to call insurance company to determine out of pocket expense if they have not yet received this vaccine. Advised may also receive vaccine at local pharmacy or Health Dept. Verbalized acceptance and understanding.  Screening Tests Health Maintenance  Topic Date Due   COVID-19 Vaccine (3 - Booster for Pfizer series) 05/20/2021 (Originally 07/07/2020)   Zoster Vaccines- Shingrix (1 of 2) 08/09/2021 (Originally 03/09/2005)   TETANUS/TDAP  05/10/2022 (Originally [birth year]1974)   MAMMOGRAM  12/28/2022   COLONOSCOPY (Pts 45-53yrs Insurance coverage will need to be confirmed)  04/28/2026   Pneumonia Vaccine 81+ Years old  Completed   INFLUENZA VACCINE  Completed   DEXA SCAN  Completed   Hepatitis C Screening  Completed   HPV VACCINES  Aged Out   Health Maintenance There are no preventive care reminders to display for this patient.  Lung Cancer Screening: (Low Dose CT Chest recommended if Age 22-80 years, 30 pack-year currently smoking OR have quit w/in 15years.) does not qualify.   Vision Screening: Recommended annual ophthalmology exams for early detection of glaucoma and other disorders of the eye.  Dental Screening: Recommended annual dental exams for proper oral hygiene  Community Resource Referral / Chronic Care Management: CRR required this visit?  No   CCM required this visit?  No      Plan:   Keep all routine maintenance appointments.   I have personally reviewed and noted the following in the patient's chart:   Medical and social history Use of alcohol, tobacco or illicit drugs  Current medications and supplements including opioid prescriptions. Patient is not currently taking opioid prescriptions. Functional ability and status Nutritional status Physical activity Advanced directives List of other physicians Hospitalizations, surgeries, and ER visits in previous 12 months Vitals Screenings to include cognitive, depression, and falls Referrals and  appointments  In addition, I have reviewed and discussed with patient certain preventive protocols, quality metrics, and best practice recommendations. A written personalized care plan for preventive services as well as general preventive health recommendations were provided to patient via mychart.     Ashok Pall, LPN   1/61/0960

## 2021-05-09 NOTE — Patient Instructions (Addendum)
?  Lynn Peters , ?Thank you for taking time to come for your Medicare Wellness Visit. I appreciate your ongoing commitment to your health goals. Please review the following plan we discussed and let me know if I can assist you in the future.  ? ?These are the goals we discussed: ? Goals   ? ?  ? Patient Stated  ?   Weight (lb) < 145 lb (65.8 kg) (pt-stated)   ?   I want to lose about 5lbs  ?Healthy diet ?Stay active ?  ? ?  ?  ?This is a list of the screening recommended for you and due dates:  ?Health Maintenance  ?Topic Date Due  ? COVID-19 Vaccine (3 - Booster for Pfizer series) 05/20/2021*  ? Zoster (Shingles) Vaccine (1 of 2) 08/09/2021*  ? Tetanus Vaccine  05/10/2022*  ? Mammogram  12/28/2022  ? Colon Cancer Screening  04/28/2026  ? Pneumonia Vaccine  Completed  ? Flu Shot  Completed  ? DEXA scan (bone density measurement)  Completed  ? Hepatitis C Screening: USPSTF Recommendation to screen - Ages 73-79 yo.  Completed  ? HPV Vaccine  Aged Out  ?*Topic was postponed. The date shown is not the original due date.  ?  ?

## 2021-05-24 ENCOUNTER — Encounter: Payer: Self-pay | Admitting: Internal Medicine

## 2021-05-24 ENCOUNTER — Ambulatory Visit (INDEPENDENT_AMBULATORY_CARE_PROVIDER_SITE_OTHER): Payer: Medicare Other | Admitting: Internal Medicine

## 2021-05-24 VITALS — BP 120/80 | HR 70 | Temp 98.2°F | Resp 14 | Ht 65.72 in | Wt 146.0 lb

## 2021-05-24 DIAGNOSIS — W57XXXA Bitten or stung by nonvenomous insect and other nonvenomous arthropods, initial encounter: Secondary | ICD-10-CM | POA: Diagnosis not present

## 2021-05-24 DIAGNOSIS — S0086XA Insect bite (nonvenomous) of other part of head, initial encounter: Secondary | ICD-10-CM | POA: Diagnosis not present

## 2021-05-24 DIAGNOSIS — L249 Irritant contact dermatitis, unspecified cause: Secondary | ICD-10-CM

## 2021-05-24 DIAGNOSIS — L509 Urticaria, unspecified: Secondary | ICD-10-CM | POA: Diagnosis not present

## 2021-05-24 MED ORDER — CLOBETASOL PROPIONATE 0.05 % EX CREA: 1.0000 "application " | TOPICAL_CREAM | Freq: Two times a day (BID) | CUTANEOUS | 0 refills | Status: DC

## 2021-05-24 MED ORDER — METHYLPREDNISOLONE ACETATE 40 MG/ML IJ SUSP
40.0000 mg | Freq: Once | INTRAMUSCULAR | Status: AC
  Administered 2021-05-24: 40 mg via INTRAMUSCULAR

## 2021-05-24 NOTE — Patient Instructions (Addendum)
Insect Bite, Adult ?An insect bite can make your skin red, itchy, and swollen. An insect bite is different from an insect sting, which happens when an insect injects poison (venom) into the skin. ?Some insects can spread disease to people through a bite. However, most insect bites do not lead to disease and are not serious. ?What are the causes? ?Insects may bite for a variety of reasons, including: ?Hunger. ?To defend themselves. ?Insects that bite include: ?Spiders. ?Mosquitoes. ?Ticks. ?Fleas. ?Ants. ?Flies. ?Kissing bugs. ?Chiggers. ?What are the signs or symptoms? ?Symptoms of this condition include: ?Itching or pain in the bite area. ?Redness and swelling in the bite area. ?An open wound (skin ulcer). ?In many cases, symptoms last for 2-4 days. ?In rare cases, a person may have a severe allergic reaction (anaphylactic reaction) to a bite. Symptoms of an anaphylactic reaction may include: ?Feeling warm in the face (flushed). This may include redness. ?Itchy, red, swollen areas of skin (hives). ?Swelling of the eyes, lips, face, mouth, tongue, or throat. ?Difficulty breathing, speaking, or swallowing. ?Noisy breathing (wheezing). ?Dizziness or light-headedness. ?Fainting. ?Pain or cramping in the abdomen. ?Vomiting. ?Diarrhea. ?How is this diagnosed? ?This condition is usually diagnosed based on symptoms and a physical exam. ?How is this treated? ?Treatment is usually not needed. Symptoms often go away on their own. When treatment is recommended, it may involve: ?Applying a cream or lotion to the bite area. This treatment helps with itching. ?Taking an antibiotic medicine. This treatment is needed if the bite area gets infected. ?Getting a tetanus shot, if you are not up to date on this vaccine. ?Applying ice to the affected area. ?Allergy medicines called antihistamines. This treatment may be needed if you develop itching or an allergic reaction to the insect bite. ?Giving yourself an epinephrine injection if  you have an anaphylactic reaction to a bite. To give the injection, you will use what is commonly called an auto-injector "pen" (pre-filled automatic epinephrine injection device). Your health care provider will teach you how to use an auto-injector pen. ?Follow these instructions at home: ?Bite area care ? ?Do not scratch the bite area. ?Keep the bite area clean and dry. Wash it every day with soap and water as told by your health care provider. ?Check the bite area every day for signs of infection. Check for: ?Redness, swelling, or pain. ?Fluid or blood. ?Warmth. ?Pus or a bad smell. ?Managing pain, itching, and swelling ? ?You may apply cortisone cream, calamine lotion, or a paste made of baking soda and water to the bite area as told by your health care provider. ?If directed, put ice on the bite area. ?Put ice in a plastic bag. ?Place a towel between your skin and the bag. ?Leave the ice on for 20 minutes, 2-3 times a day. ?General instructions ?Apply or take over-the-counter and prescription medicines only as told by your health care provider. ?If you were prescribed an antibiotic medicine, take or apply it as told by your health care provider. Do not stop using the antibiotic even if your condition improves. ?Keep all follow-up visits as told by your health care provider. This is important. ?How is this prevented? ?To help reduce your risk of insect bites: ?When you are outdoors, wear clothing that covers your arms and legs. This is especially important in the early morning and evening. ?Use insect repellent. The best insect repellents contain DEET, picaridin, oil of lemon eucalyptus (OLE), or IR3535. ?Consider spraying your clothing with a pesticide called permethrin. Permethrin  helps prevent insect bites. It works for several weeks and for up to 5-6 clothing washes. Do not apply permethrin directly to the skin. ?If your home windows do not have screens, consider installing them. ?If you will be sleeping in  an area where there are mosquitoes, consider covering your sleeping area with a mosquito net. ?Contact a health care provider if: ?You have redness, swelling, or pain in the bite area. ?You have fluid or blood coming from the bite area. ?The bite area feels warm to the touch. ?You have pus or a bad smell coming from the bite area. ?You have a fever. ?Get help right away if: ?You have joint pain. ?You have a rash. ?You feel unusually tired or sleepy. ?You have neck pain. ?You have a headache. ?You have unusual weakness. ?You develop symptoms of an anaphylactic reaction. These may include: ?Flushed skin. ?Hives. ?Swelling of the eyes, lips, face, mouth, tongue, or throat. ?Difficulty breathing, speaking, or swallowing. ?Wheezing. ?Dizziness or light-headedness. ?Fainting. ?Pain or cramping in the abdomen. ?Vomiting. ?Diarrhea. ?These symptoms may represent a serious problem that is an emergency. Do not wait to see if the symptoms will go away. Do the following right away: ?Use the auto-injector pen as you have been instructed. ?Get medical help. Call your local emergency services (911 in the [place] Do not drive yourself to the hospital. ?Summary ?An insect bite can make your skin red, itchy, and swollen. ?Treatment is usually not needed. Symptoms often go away on their own. When treatment is recommended, it may involve taking medicine, applying medicine to the area, or applying ice. ?Apply or take over-the-counter and prescription medicines only as told by your health care provider. ?Use insect repellent to help prevent insect bites. ?Contact a health care provider if you have any signs of infection in the bite area. ?This information is not intended to replace advice given to you by your health care provider. Make sure you discuss any questions you have with your health care provider. ?Document Revised: 08/07/2017 Document Reviewed: 08/08/2017 ?Elsevier Patient Education ? [place]. ?Contact  Dermatitis ?Dermatitis is redness, soreness, and swelling (inflammation) of the skin. Contact dermatitis is a reaction to certain substances that touch the skin. Many different substances can cause contact dermatitis. There are two types of contact dermatitis: ?Irritant contact dermatitis. This type is caused by something that irritates your skin, such as having dry hands from washing them too often with soap. This type does not require previous exposure to the substance for a reaction to occur. This is the most common type. ?Allergic contact dermatitis. This type is caused by a substance that you are allergic to, such as poison ivy. This type occurs when you have been exposed to the substance (allergen) and develop a sensitivity to it. Dermatitis may develop soon after your first exposure to the allergen, or it may not develop until the next time you are exposed and every time thereafter. ?What are the causes? ?Irritant contact dermatitis is most commonly caused by exposure to: ?Makeup. ?Soaps. ?Detergents. ?Bleaches. ?Acids. ?Metal salts, such as nickel. ?Allergic contact dermatitis is most commonly caused by exposure to: ?Poisonous plants. ?Chemicals. ?Jewelry. ?Latex. ?Medicines. ?Preservatives in products, such as clothing. ?What increases the risk? ?You are more likely to develop this condition if you have: ?A job that exposes you to irritants or allergens. ?Certain medical conditions, such as asthma or eczema. ?What are the signs or symptoms? ?Symptoms of this condition may occur on your body anywhere the  irritant has touched you or is touched by you. ?Symptoms include: ?Dryness or flaking. ?Redness. ?Cracks. ?Itching. ?Pain or a burning feeling. ?Blisters. ?Drainage of small amounts of blood or clear fluid from skin cracks. ?With allergic contact dermatitis, there may also be swelling in areas such as the eyelids, mouth, or genitals. ?How is this diagnosed? ?This condition is diagnosed with a medical history  and physical exam. ?A patch skin test may be performed to help determine the cause. ?If the condition is related to your job, you may need to see an occupational medicine specialist. ?How is this treated? ?This condi

## 2021-05-24 NOTE — Progress Notes (Addendum)
Chief Complaint  ?Patient presents with  ? Acute Visit  ?  Pt c/o rash in legs,arms,abdomen itching burning. Onset Monday had tick bite last wk in neck. Was using hair product which caused choking concerned she may have had chemical reaction. Was also working garden poison ivy?   ? ?Acute ?1. Tick bite last week, rash early this week  ?Rash to right arm, hand right back and left abdomen (where rash started) onselt Monday last week had tick bite on right neck and lip swelling. She recently had new detangler hair product and new chemical caused choking and smelled awful on her hair. She also recently was doing digging in the garden and started itching and burning of the skin ?She wants to be tested tick born illnesses  ?Tried claritin kids per her uses  ? ?Review of Systems  ?Constitutional:  Negative for weight loss.  ?HENT:  Negative for hearing loss.   ?Eyes:  Negative for blurred vision.  ?Respiratory:  Negative for shortness of breath.   ?Cardiovascular:  Negative for chest pain.  ?Gastrointestinal:  Negative for abdominal pain and blood in stool.  ?Genitourinary:  Negative for dysuria.  ?Musculoskeletal:  Negative for falls and joint pain.  ?Skin:  Positive for itching and rash.  ?Neurological:  Negative for headaches.  ?Psychiatric/Behavioral:  Negative for depression.   ?Past Medical History:  ?Diagnosis Date  ? Allergy   ? Anxiety   ? Arthritis   ? Asthma   ? Chest pain   ? Chicken pox   ? Complication of anesthesia   ? Difficulty breathing after GYN procedure  ? Diverticulitis 2009  ? GERD (gastroesophageal reflux disease)   ? Pneumonia   ? ?Past Surgical History:  ?Procedure Laterality Date  ? ABDOMINAL HYSTERECTOMY  2004  ? partial; has cervix and ovaries per patient; done for fibroids, noncancerous  ? ANTERIOR CRUCIATE LIGAMENT REPAIR Right 2000  ? COLONOSCOPY WITH PROPOFOL N/A 04/28/2019  ? Procedure: COLONOSCOPY WITH PROPOFOL;  Surgeon: Lin Landsman, MD;  Location: [facility] ENDOSCOPY;  Service:  Gastroenterology;  Laterality: N/A;  ? ESOPHAGOGASTRODUODENOSCOPY (EGD) WITH PROPOFOL N/A 04/28/2019  ? Procedure: ESOPHAGOGASTRODUODENOSCOPY (EGD) WITH PROPOFOL;  Surgeon: Lin Landsman, MD;  Location: [facility] ENDOSCOPY;  Service: Gastroenterology;  Laterality: N/A;  ? LAPAROSCOPY    ? TOTAL KNEE ARTHROPLASTY Right 06/02/2020  ? Procedure: RIGHT TOTAL KNEE ARTHROPLASTY;  Surgeon: Mcarthur Rossetti, MD;  Location: [facility];  Service: Orthopedics;  Laterality: Right;  Needs RNFA  ? UTERINE FIBROID SURGERY    ? ?Family History  ?Problem Relation Age of Onset  ? Alzheimer's disease Mother   ? Asthma Mother   ? Diabetes Mother   ? Hyperlipidemia Mother   ? Hypertension Mother   ? Stroke Mother 31  ? Heart disease Father   ?     CHF  ? Diabetes Father   ? Hyperlipidemia Father   ? Asthma Sister   ? Diabetes Sister   ? Alcohol abuse Brother   ? Heart disease Brother   ? Breast cancer Neg Hx   ? ?Social History  ? ?Socioeconomic History  ? Marital status: Married  ?  Spouse name: Not on file  ? Number of children: Not on file  ? Years of education: Not on file  ? Highest education level: Not on file  ?Occupational History  ? Not on file  ?Tobacco Use  ? Smoking status: Never  ? Smokeless tobacco: Never  ?Vaping Use  ? Vaping Use: Never used  ?  Substance and Sexual Activity  ? Alcohol use: Yes  ?  Alcohol/week: 0.0 standard drinks  ?  Comment: rare  ? Drug use: No  ? Sexual activity: Yes  ?  Partners: Male  ?  Birth control/protection: Surgical  ?Other Topics Concern  ? Not on file  ?Social History Narrative  ? 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  ?   ? Married.  ?   ? Diet- gym, knee limits running, paddleboating. Interval training 3x per week.   ?   ? Diet-regular  ?   ? ?Social Determinants of Health  ? ?Financial Resource Strain: Low Risk   ? Difficulty of Paying Living Expenses: Not hard at all  ?Food Insecurity: No Food Insecurity  ? Worried About Charity fundraiser in  the Last Year: Never true  ? Ran Out of Food in the Last Year: Never true  ?Transportation Needs: No Transportation Needs  ? Lack of Transportation (Medical): No  ? Lack of Transportation (Non-Medical): No  ?Physical Activity: Not on file  ?Stress: No Stress Concern Present  ? Feeling of Stress : Not at all  ?Social Connections: Not on file  ?Intimate Partner Violence: Not At Risk  ? Fear of Current or Ex-Partner: No  ? Emotionally Abused: No  ? Physically Abused: No  ? Sexually Abused: No  ? ?Current Meds  ?Medication Sig  ? albuterol (VENTOLIN HFA) 108 (90 Base) MCG/ACT inhaler INHALE 2 PUFFS INTO THE LUNGS EVERY 4 (FOUR) HOURS AS NEEDED FOR WHEEZING OR SHORTNESS OF BREATH.  ? Cholecalciferol (VITAMIN D3) 50 MCG (2000 UT) CAPS Take by mouth.  ? clobetasol cream (TEMOVATE) 2.54 % Apply 1 application. topically 2 (two) times daily. Not face prn  ? famotidine (PEPCID) 20 MG tablet Take 1 tablet (20 mg total) by mouth daily.  ? fluticasone (FLOVENT HFA) 110 MCG/ACT inhaler Inhale 1 puff into the lungs 2 (two) times daily.  ? PRESCRIPTION MEDICATION Inhale 2 puffs into the lungs in the morning and at bedtime. Flixotide Inhaler  ? vitamin B-12 (CYANOCOBALAMIN) 1000 MCG tablet Take 1,000 mcg by mouth daily.  ? ?Current Facility-Administered Medications for the 05/24/21 encounter (Office Visit) with McLean-Scocuzza, Nino Glow, MD  ?Medication  ? methylPREDNISolone acetate (DEPO-MEDROL) injection 40 mg  ? ?Allergies  ?Allergen Reactions  ? Pantoprazole Sodium Shortness Of Breath  ? Tetracyclines & Related   ? Breo Ellipta [Fluticasone Furoate-Vilanterol] Palpitations  ? Flagyl [Metronidazole] Other (See Comments)  ?  Neck pain, spasm  ? ?Recent Results (from the past 2160 hour(s))  ?Basic metabolic panel     Status: Abnormal  ? Collection Time: 03/13/21 12:10 PM  ?Result Value Ref Range  ? Sodium 139 135 - 145 mmol/L  ? Potassium 3.5 3.5 - 5.1 mmol/L  ? Chloride 104 98 - 111 mmol/L  ? CO2 26 22 - 32 mmol/L  ? Glucose, Bld 106  (H) 70 - 99 mg/dL  ?  Comment: Glucose reference range applies only to samples taken after fasting for at least 8 hours.  ? BUN 10 8 - 23 mg/dL  ? Creatinine, Ser 0.74 0.44 - 1.00 mg/dL  ? Calcium 9.4 8.9 - 10.3 mg/dL  ? GFR, Estimated >60 >60 mL/min  ?  Comment: (NOTE) ?Calculated using the CKD-EPI Creatinine Equation (2021) ?  ? Anion gap 9 5 - 15  ?  Comment: Performed at [facility], [address] [place],  [address]  ?  CBC     Status: Abnormal  ? Collection Time: 03/13/21 12:10 PM  ?Result Value Ref Range  ? WBC 7.4 4.0 - 10.5 K/uL  ? RBC 5.28 (H) 3.87 - 5.11 MIL/uL  ? Hemoglobin 15.3 (H) 12.0 - 15.0 g/dL  ? HCT 47.6 (H) 36.0 - 46.0 %  ? MCV 90.2 80.0 - 100.0 fL  ? MCH 29.0 26.0 - 34.0 pg  ? MCHC 32.1 30.0 - 36.0 g/dL  ? RDW 13.0 11.5 - 15.5 %  ? Platelets 312 150 - 400 K/uL  ? nRBC 0.0 0.0 - 0.2 %  ?  Comment: Performed at [facility], [address], [place], [place] [address]  ?Troponin I (High Sensitivity)     Status: None  ? Collection Time: 03/13/21 12:10 PM  ?Result Value Ref Range  ? Troponin I (High Sensitivity) 3 <18 ng/L  ?  Comment: (NOTE) ?Elevated high sensitivity troponin I (hsTnI) values and significant  ?changes across serial measurements may suggest ACS but many other  ?chronic and acute conditions are known to elevate hsTnI results.  ?Refer to the "Links" section for chest pain algorithms and additional  ?guidance. ?Performed at [facility], [place], ?[place] [address] ?  ?Lipid panel     Status: Abnormal  ? Collection Time: 03/19/21  8:59 AM  ?Result Value Ref Range  ? Cholesterol, Total 185 100 - 199 mg/dL  ? Triglycerides 89 0 - 149 mg/dL  ? HDL 56 >39 mg/dL  ? VLDL Cholesterol Cal 16 5 - 40 mg/dL  ? LDL Chol Calc (NIH) 113 (H) 0 - 99 mg/dL  ? Chol/HDL Ratio 3.3 0.0 - 4.4 ratio  ?  Comment:                                   T. Chol/HDL Ratio ?                                            Men  Women ?                              1/2  Avg.Risk  3.4    3.3 ?                                  Avg.Risk  5.0    4.4 ?                               2X Avg.Risk  9.6    7.1 ?                               3X Avg.Risk 23.4   11.0 ?  ?TSH     Status

## 2021-05-25 LAB — B. BURGDORFI ANTIBODIES: B burgdorferi Ab IgG+IgM: <0.9 index

## 2021-05-26 LAB — ROCKY MTN SPOTTED FVR AB, IGG-BLOOD: RMSF IgG: NEGATIVE

## 2021-05-26 LAB — ROCKY MTN SPOTTED FVR AB, IGM-BLOOD: RMSF IgM: 0.52 index (ref 0.00–0.89)

## 2021-05-29 ENCOUNTER — Encounter: Payer: Self-pay | Admitting: Family

## 2021-05-29 ENCOUNTER — Encounter: Payer: Self-pay | Admitting: Internal Medicine

## 2021-05-30 NOTE — Telephone Encounter (Signed)
Pt called stating she would like to make an appointment for her rash on her body but [facility] does not have any appt until 5/17. Pt want to know if provider can fit her in sooner ?

## 2021-05-31 NOTE — Addendum Note (Signed)
Addended by: Orland Mustard on: 05/31/2021 05:08 PM ? ? Modules accepted: Orders ? ?

## 2021-06-01 ENCOUNTER — Ambulatory Visit (INDEPENDENT_AMBULATORY_CARE_PROVIDER_SITE_OTHER): Payer: Medicare Other | Admitting: Allergy

## 2021-06-01 ENCOUNTER — Encounter: Payer: Self-pay | Admitting: Allergy

## 2021-06-01 VITALS — BP 138/70 | HR 69 | Temp 98.2°F | Resp 12 | Ht 65.5 in | Wt 146.0 lb

## 2021-06-01 DIAGNOSIS — L282 Other prurigo: Secondary | ICD-10-CM

## 2021-06-01 DIAGNOSIS — J310 Chronic rhinitis: Secondary | ICD-10-CM

## 2021-06-01 DIAGNOSIS — J4520 Mild intermittent asthma, uncomplicated: Secondary | ICD-10-CM

## 2021-06-01 NOTE — Progress Notes (Signed)
? ? ?New Patient Note ? ?RE: Lynn Peters MRN: [id number] DOB: 04-11-1955 ?Date of Office Visit: 06/01/2021 ? ?Primary care provider: Burnard Hawthorne, FNP ? ?Chief Complaint: asthma and rash ? ?History of present illness: ?Lynn Peters is a 66 y.o. female presenting today for evaluation of asthma and dermatitis.   ? ?She has had a rash over the past 2 weeks. She states it is all over her body.  She states it has gotten better over the past 24 hours.  It is very itchy.  She went to her PCP for this rash who thought she has a bit of poison ivy on her hands but had hives on the rest of her body.  She received a steroid injection.  She states her PCP did test for lyme and RMSF.   ?She states she got a tick bite on her neck and couple days later the rash started.  She was also gardening prior to the onset of the rash and she has a lot of poison ivy and sumac on her property.  He states however when she does yard work she wears long boots and long sleeves and is pretty much fully covered.  She also recalls prior to onset of the rash going to her hairdresser and she used a product that had a strong odor and was affecting her husband due to the smell so she had to wash it out.  She is wondering if it is a contact allergy or a systemic allergy.  For the rash she is taking kids liquid Benadryl however this seemed to cause her to cough. ?For allergy symptoms like nasal congestion, drainage, sneezing she takes children's dose of cetirizine which normally helps control the symptoms.  She has an air purifier in the home.   ?She has history of asthma from childhood.  For her asthma she gets her fluticasone from [place] as Flixitide.  The inhaler says Flixited 224mg CFC free.   She was using it 1 puff 2 times a day because her asthma has been under control.  However since she has been using the Benadryl which is causing her to cough more she went back up to 2 puffs twice a day.  She otherwise denies any significant daytime or  nighttime symptoms prior and had not had any need for systemic steroids or any ED or urgent care visits. 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   ?She has used Breo in the past and it caused heart palpitations. ? ?She provided pictures of the rash when it was at its peak and does show erythematous papules. ? ?Review of systems: ?Review of Systems  ?Constitutional: Negative.   ?HENT: Negative.    ?Eyes: Negative.   ?Respiratory:  Positive for cough.   ?Cardiovascular: Negative.   ?Gastrointestinal: Negative.   ?Musculoskeletal: Negative.   ?Skin:  Positive for rash.  ?Allergic/Immunologic: Negative.   ?Neurological: Negative.   ? ?All other systems negative unless noted above in HPI ? ?Past medical history: ?Past Medical History:  ?Diagnosis Date  ? Allergy   ? Anxiety   ? Arthritis   ? Asthma   ? Chest pain   ? Chicken pox   ? Complication of anesthesia   ? Difficulty breathing after GYN procedure  ? Diverticulitis 2009  ? GERD (gastroesophageal reflux disease)   ?  Pneumonia   ? ? ?Past surgical history: ?Past Surgical History:  ?Procedure Laterality Date  ? ABDOMINAL HYSTERECTOMY  2004  ? partial; has cervix and ovaries per patient; done for fibroids, noncancerous  ? ANTERIOR CRUCIATE LIGAMENT REPAIR Right 2000  ? COLONOSCOPY WITH PROPOFOL N/A 04/28/2019  ? Procedure: COLONOSCOPY WITH PROPOFOL;  Surgeon: Lin Landsman, MD;  Location: [facility] ENDOSCOPY;  Service: Gastroenterology;  Laterality: N/A;  ? ESOPHAGOGASTRODUODENOSCOPY (EGD) WITH PROPOFOL N/A 04/28/2019  ? Procedure: ESOPHAGOGASTRODUODENOSCOPY (EGD) WITH PROPOFOL;  Surgeon: Lin Landsman, MD;  Location: [facility] ENDOSCOPY;  Service: Gastroenterology;  Laterality: N/A;  ? LAPAROSCOPY    ? TOTAL KNEE ARTHROPLASTY Right 06/02/2020  ? Procedure: RIGHT TOTAL KNEE ARTHROPLASTY;  Surgeon: Mcarthur Rossetti, MD;  Location: [facility];  Service: Orthopedics;  Laterality: Right;  Needs RNFA  ? UTERINE FIBROID SURGERY    ? ? ?Family history:  ?Family History  ?Problem Relation Age of Onset  ? Alzheimer's disease Mother   ? Asthma Mother   ? Diabetes Mother   ? Hyperlipidemia Mother   ? Hypertension Mother   ? Stroke Mother 21  ? Heart disease Father   ?     CHF  ? Diabetes Father   ? Hyperlipidemia Father   ? Asthma Sister   ? Diabetes Sister   ? Alcohol abuse Brother   ? Heart disease Brother   ? Breast cancer Neg Hx   ? ? ?Social history: ?Lives in a home without carpeting with heat.  Dog in the home.  She lives in [place] acres in a [place].  She denies concern for water damage, mildew or roaches in the home.  She is retired.  She denies a smoking history. ? ? ?Medication List: ?Current Outpatient Medications  ?Medication Sig Dispense Refill  ? albuterol (VENTOLIN HFA) 108 (90 Base) MCG/ACT inhaler INHALE 2 PUFFS INTO THE LUNGS EVERY 4 (FOUR) HOURS AS NEEDED FOR WHEEZING OR SHORTNESS OF BREATH. 18 g 2  ? Cetirizine HCl (ZYRTEC CHILDRENS ALLERGY PO) Take 5 mg by mouth at bedtime. liquid    ? Cholecalciferol (VITAMIN D3) 50 MCG (2000 UT) CAPS Take by mouth.    ? famotidine (PEPCID) 20 MG tablet Take 1 tablet (20 mg total) by mouth daily. 30 tablet 2  ? PRESCRIPTION MEDICATION Inhale 2 puffs into the lungs in the morning and at bedtime. Flixotide Inhaler    ? Probiotic Product (SOLUBLE FIBER/PROBIOTICS PO) Take 1 capsule by mouth daily.    ? vitamin B-12 (CYANOCOBALAMIN) 1000 MCG tablet Take 1,000 mcg by mouth daily.    ? clobetasol cream (TEMOVATE) 8.78 % Apply 1 application. topically 2 (two) times daily. Not face prn (Patient not taking: Reported on 06/01/2021) 60 g 0  ? fluticasone (FLOVENT HFA) 110 MCG/ACT inhaler Inhale 1 puff into the lungs 2 (two) times daily. (Patient not taking: Reported on 06/01/2021) 1 each 4  ? TURMERIC PO Take by mouth. 1,'000mg'$  once a day (Patient not taking: Reported on 05/24/2021)     ? ?No current facility-administered medications for this visit.  ? ? ?Known medication allergies: ?Allergies  ?Allergen Reactions  ? Pantoprazole Sodium Shortness Of Breath  ? Tetracyclines & Related   ? Breo Ellipta [Fluticasone Furoate-Vilanterol] Palpitations  ? Flagyl [Metronidazole] Other (See Comments)  ?  Neck pain, spasm  ? ? ? ?Physical examination: ?Blood pressure 138/70, pulse 69, temperature 98.2 ?F (36.8 ?C), temperature source Temporal, resp. rate 12, height 5' 5.5" (1.664 m), weight 146 lb (66.2 kg),  SpO2 99 %. ? ?General: Alert, interactive, in no acute distress. ?HEENT: PERRLA, TMs pearly gray, turbinates without discharge, post-pharynx non erythematous. ?Neck: Supple without lymphadenopathy. ?Lungs: Clear to auscultation without wheezing, rhonchi or rales. {no increased work of breathing. ?CV: Normal S1, S2 without murmurs. ?Abdomen: Nondistended, nontender. ?Skin: Lower back with erythematous pinpoint papules some with central excoriation . ?Extremities:  No clubbing, cyanosis or edema. ?Neuro:   Grossly intact. ? ?Diagnositics/Labs: ?Labs:  ?Component ?    Latest Ref Rng 05/24/2021  ?B burgdorferi Ab IgG+IgM ?    index <0.90   ?[place] Spotted Fever, IgM ?    0.00 - 0.89 index 0.52   ?RMSF, IgG, EIA ?    Negative  Negative   ? ? ?Spirometry: FEV1: 2.09L 87%, FVC: 2.59L 84%, ratio consistent with nonobstructive pattern ? ?Assessment and plan: ?Papular urticaria ?- Appearance and description of rash is most consistent with papular urticaria. ?Papular urticaria is a hypersensitivity disorder in which insect bites (i.e.  Chiggers, fleas, mosquitoes) which can lead to recurrent and sometimes chronic itchy papules on areas of skin (eg, arms, lower legs, upper back, scalp).  ?- Treatment is itch control and avoidance/prevention ?- Provided with a sample of Opzelura ointment to apply and itchy, rash areas once 2 times a day as needed.  This is a nonsteroid ointment that can be applied anywhere to the  body if needed ?- Keep skin moisturized especially after bathing ? ?Asthma ?- Daily controller medication(s): Fluticasone (Flovent- [place] brand) 110 mcg 1-2 puffs twice a day.  This should be a reduced dose fro

## 2021-06-01 NOTE — Patient Instructions (Signed)
Papular urticaria ?- Appearance and description of rash is most consistent with papular urticaria. ?Papular urticaria is a hypersensitivity disorder in which insect bites (i.e.  Chiggers, fleas, mosquitoes) which can lead to recurrent and sometimes chronic itchy papules on areas of skin (eg, arms, lower legs, upper back, scalp).  ?- Treatment is itch control and avoidance/prevention ?- Provided with a sample of Opzelura ointment to apply and itchy, rash areas once 2 times a day as needed.  This is a nonsteroid ointment that can be applied anywhere to the body if needed ?- Keep skin moisturized especially after bathing ? ?Asthma ?- Daily controller medication(s): Fluticasone (Flovent- [place] brand) 110 mcg 1-2 puffs twice a day.  This should be a reduced dose from your current Flixotide inhaler.  I do not believe that you need to be on Flixotide 250 mcg at this time as you are well controlled.  If you want to continue getting your medications as you have been doing I would recommend you see if there is a Flixotide dose in the 100 mcg range ?- Rescue medications: albuterol 2 puffs every 4-6 hours as needed ?- Changes during respiratory infections or worsening symptoms: Increase fluticasone to 3 puffs three times daily for TWO WEEKS. ?- Asthma control goals:  ?* Full participation in all desired activities (may need albuterol before activity) ?* Albuterol use two time or less a week on average (not counting use with activity) ?* Cough interfering with sleep two time or less a month ?* Oral steroids no more than once a year ?* No hospitalizations ? ?Allergies ?- Continue with: Zyrtec 5 mg daily as needed.  Can take additional dose for 10 mg daily total which is the typical adult dosing ?- Will obtain environmental allergy panel ? ?Follow-up in 6 to 12 months or sooner if needed ? ?

## 2021-06-04 ENCOUNTER — Telehealth: Payer: Self-pay

## 2021-06-04 NOTE — Telephone Encounter (Signed)
06/04/21 -Left msg to return call. Confirming if patient still wants to be seen here due to urgent referral. Patient seen by allergist Friday 06/01/21. aw ?

## 2021-06-05 LAB — ALLERGENS W/TOTAL IGE AREA 2
Alternaria Alternata IgE: <0.1 kU/L
Aspergillus Fumigatus IgE: <0.1 kU/L
Bermuda Grass IgE: <0.1 kU/L
Cat Dander IgE: <0.1 kU/L
Cedar, Mountain IgE: 0.25 kU/L — AB
Cladosporium Herbarum IgE: 0.1 kU/L — AB
Cockroach, German IgE: <0.1 kU/L
Common Silver Birch IgE: <0.1 kU/L
Cottonwood IgE: <0.1 kU/L
D Farinae IgE: 1.01 kU/L — AB
D Pteronyssinus IgE: 1.3 kU/L — AB
Dog Dander IgE: <0.1 kU/L
Elm, American IgE: <0.1 kU/L
IgE (Immunoglobulin E), Serum: 441 IU/mL (ref 6–495)
Johnson Grass IgE: <0.1 kU/L
Maple/Box Elder IgE: <0.1 kU/L
Mouse Urine IgE: <0.1 kU/L
Oak, White IgE: <0.1 kU/L
Pecan, Hickory IgE: 0.13 kU/L — AB
Penicillium Chrysogen IgE: <0.1 kU/L
Pigweed, Rough IgE: <0.1 kU/L
Ragweed, Short IgE: <0.1 kU/L
Sheep Sorrel IgE Qn: <0.1 kU/L
Timothy Grass IgE: <0.1 kU/L
White Mulberry IgE: <0.1 kU/L

## 2021-06-06 ENCOUNTER — Ambulatory Visit (INDEPENDENT_AMBULATORY_CARE_PROVIDER_SITE_OTHER): Payer: Medicare Other | Admitting: Dermatology

## 2021-06-06 DIAGNOSIS — D692 Other nonthrombocytopenic purpura: Secondary | ICD-10-CM | POA: Diagnosis not present

## 2021-06-06 DIAGNOSIS — L309 Dermatitis, unspecified: Secondary | ICD-10-CM

## 2021-06-06 DIAGNOSIS — R21 Rash and other nonspecific skin eruption: Secondary | ICD-10-CM | POA: Diagnosis not present

## 2021-06-06 MED ORDER — CLOBETASOL PROPIONATE 0.05 % EX SOLN: CUTANEOUS | 0 refills | Status: DC

## 2021-06-06 NOTE — Progress Notes (Signed)
? ?New Patient Visit ? ?Subjective  ?Lynn Peters is a 66 y.o. female who presents for the following: Rash (New patient here today for rash all over body that was itchy. Patient saw allergist on 4/21 and was diagnosed with urticaria and given samples of opzelura. Patient also was using calamine and baking soda paste. Patient also had some areas of poison ivy at the time of visit with allergist. ). ? ?Rash has improved, some areas are still bumpy but not itchy.  ? ?Patient advises she is highly allergic to poison ivy and tick bites. Patient did have a tick bite a few weeks ago and was tested for Lyme's and [facility], both were negative. Patient did receive a steroid injection.  ? ?The following portions of the chart were reviewed this encounter and updated as appropriate:  ?  ?  ? ?Review of Systems:  No other skin or systemic complaints except as noted in HPI or Assessment and Plan. ? ?Objective  ?Well appearing patient in no apparent distress; mood and affect are within normal limits. ? ?A focused examination was performed including arms, legs, trunk. Relevant physical exam findings are noted in the Assessment and Plan. ? ?legs, arms, trunk ?Light pink scaly papules with some excoriations at left thigh, right hip, abdomen, mild at right upper arm ? ? ? ?Assessment & Plan  ?Rash ?legs, arms, trunk ? ?Probable Contact dermatitis with ID reaction, pt is much improved, but not clear ? ?Pt to continue Opzelura cream samples qd/bid to itchy rash, additional sample given today ? ?Eczema Skin Care ? ?Buy TWO 16oz jars of CeraVe moisturizing cream ? [facility], [facility], [facility] (no prescription needed) ? Costs about $15 per jar  ? ?Jar #1: Use as a moisturizer as needed. Can be applied to any area of the body. Use twice daily to unaffected areas. ? ?Jar #2: Pour one 37m bottle of clobetasol 0.05% solution into jar, mix well. Label this jar to indicate the medication has been added. Use twice daily to affected areas. Do not  apply to face, groin or underarms. ? ?Moisturizer may burn or sting initially. Try for at least 4 weeks.  ? ?Recommend mild soap and moisturizing cream 1-2 times daily.  Gentle skin care handout provided.   ? ?Topical steroids (such as triamcinolone, fluocinolone, fluocinonide, mometasone, clobetasol, halobetasol, betamethasone, hydrocortisone) can cause thinning and lightening of the skin if they are used for too long in the same area. Your physician has selected the right strength medicine for your problem and area affected on the body. Please use your medication only as directed by your physician to prevent side effects.   ? ? ?clobetasol (TEMOVATE) 0.05 % external solution - legs, arms, trunk ?Patient to pour solution into jar of CeraVe and mix well. Use twice daily to affected areas. Do not apply to face, groin or underarms. ? ?Purpura - Chronic; persistent and recurrent.  Treatable, but not curable. ?- Violaceous macules and patches at arms ?- Benign ?- Related to trauma, age, sun damage and/or use of blood thinners, chronic use of topical and/or oral steroids ?- Observe ?- Can use OTC arnica containing moisturizer such as Dermend Bruise Formula if desired ?- Call for worsening or other concerns ? ?Return if symptoms worsen or fail to improve. ? ?IGraciella Belton RMA, am acting as scribe for TBrendolyn Patty MD . ? ?Documentation: I have reviewed the above documentation for accuracy and completeness, and I agree with the above. ? ?TBrendolyn PattyMD  ? ? ?

## 2021-06-06 NOTE — Patient Instructions (Addendum)
Eczema Skin Care ? ?Buy TWO 16oz jars of CeraVe moisturizing cream ? [facility], [facility], [facility] (no prescription needed) ? Costs about $15 per jar  ? ?Jar #1: Use as a moisturizer as needed. Can be applied to any area of the body. Use twice daily to unaffected areas. ?Jar #2: Pour one 13m bottle of clobetasol 0.05% solution into jar, mix well. Label this jar to indicate the medication has been added. Use twice daily to affected areas. Do not apply to face, groin or underarms. ? ?Moisturizer may burn or sting initially. Try for at least 4 weeks.   ? ?Recommend daily broad spectrum sunscreen SPF 30+ to sun-exposed areas, reapply every 2 hours as needed. Call for new or changing lesions.  ?Staying in the shade or wearing long sleeves, sun glasses (UVA+UVB protection) and wide brim hats (4-inch brim around the entire circumference of the hat) are also recommended for sun protection.  ? ? ?If You Need Anything After Your Visit ? ?If you have any questions or concerns for your doctor, please call our main line at [phone] press option 4 to reach your doctor's medical assistant. If no one answers, please leave a voicemail as directed and we will return your call as soon as possible. Messages left after 4 pm will be answered the following business day.  ? ?You may also send [place] message via MyChart. We typically respond to MyChart messages within 1-2 business days. ? ?For prescription refills, please ask your pharmacy to contact our office. Our fax number is [phone] ? ?If you have an urgent issue when the clinic is closed that cannot wait until the next business day, you can page your doctor at the number below.   ? ?Please note that while we do our best to be available for urgent issues outside of office hours, we are not available 24/7.  ? ?If you have an urgent issue and are unable to reach [place] you may choose to seek medical care at your doctor's office, retail clinic, urgent care center, or emergency  room. ? ?If you have a medical emergency, please immediately call 911 or go to the emergency department. ? ?Pager Numbers ? ?- Dr. KNehemiah Massed [phone]? ?- Dr. MLaurence Ferrari [phone]? ?- Dr. SNicole Kindred [phone]? ?In the event of inclement weather, please call our main line at [phone] an update on the status of any delays or closures. ? ?Dermatology Medication Tips: ?Please keep the boxes that topical medications come in in order to help keep track of the instructions about where and how to use these. Pharmacies typically print the medication instructions only on the boxes and not directly on the medication tubes.  ? ?If your medication is too expensive, please contact our office at [phone] 4 or send [place] message through [place]  ? ?We are unable to tell what your co-pay for medications will be in advance as this is different depending on your insurance coverage. However, we may be able to find a substitute medication at lower cost or fill out paperwork to get insurance to cover a needed medication.  ? ?If a prior authorization is required to get your medication covered by your insurance company, please allow u[place]1-2 business days to complete this process. ? ?Drug prices often vary depending on where the prescription is filled and some pharmacies may offer cheaper prices. ? ?The website www.goodrx.com contains coupons for medications through different pharmacies. The prices here do not account for what the  cost may be with help from insurance (it may be cheaper with your insurance), but the website can give you the price if you did not use any insurance.  ?- You can print the associated coupon and take it with your prescription to the pharmacy.  ?- You may also stop by our office during regular business hours and pick up a GoodRx coupon card.  ?- If you need your prescription sent electronically to a different pharmacy, notify our office through [facility] or by phone at [phone]  option 4. ? ? ? ? ?Si Usted Necesita Algo Despu?s de Su Visita ? ?Tambi?n puede enviarnos un mensaje a trav?s de MyChart. Por lo general respondemos a los mensajes de MyChart en el transcurso de 1 a 2 d?as h?biles. ? ?Para renovar recetas, por favor pida a su farmacia que se ponga en contacto con nuestra oficina. Nuestro n?mero de fax es el [phone]. ? ?Si tiene un asunto urgente cuando la cl?nica est? cerrada y que no puede esperar hasta el siguiente d?a h?bil, puede llamar/localizar a su doctor(a) al n?mero que aparece a continuaci?n.  ? ?Por favor, tenga en cuenta que aunque hacemos todo lo posible para estar disponibles para asuntos urgentes fuera del horario de oficina, no estamos disponibles las 24 horas del d?a, los 7 d?as de la semana.  ? ?Si tiene un problema urgente y no puede comunicarse con nosotros, puede optar por buscar atenci?n m?dica  en el consultorio de su doctor(a), en una cl?nica privada, en un centro de atenci?n urgente o en una sala de emergencias. ? ?Si tiene Engineer, maintenance (IT) m?dica, por favor llame inmediatamente al 911 o vaya a la sala de emergencias. ? ?N?meros de b?per ? ?- Dr. Nehemiah Massed: [phone] ? ?- Dra. Moye: [phone] ? ?- Dra. Nicole Kindred: [phone] ? ?En caso de inclemencias del tiempo, por favor llame a nuestra l?nea principal al [phone] para una actualizaci?n sobre el estado de cualquier retraso o cierre. ? ?Consejos para la medicaci?n en dermatolog?a: ?Por favor, guarde las cajas en las que vienen los medicamentos de uso t?pico para ayudarle a seguir las instrucciones sobre d?nde y c?mo usarlos. Las farmacias generalmente imprimen las instrucciones del medicamento s?lo en las cajas y no directamente en los tubos del [place].  ? ?Si su medicamento es muy caro, por favor, p?ngase en contacto con Zigmund Daniel llamando al [phone] y presione la opci?n 4 o env?enos un mensaje a trav?s de MyChart.  ? ?No podemos decirle cu?l ser? su copago por los medicamentos  por adelantado ya que esto es diferente dependiendo de la cobertura de su seguro. Sin embargo, es posible que podamos encontrar un medicamento sustituto a Electrical engineer un formulario para que el seguro cubra el medicamento que se considera necesario.  ? ?Si se requiere Ardelia Mems autorizaci?n previa para que su compa??a de seguros [place] su medicamento, por favor perm?tanos de 1 a 2 d?as h?biles para completar este proceso. ? ?Los precios de los medicamentos var?an con frecuencia dependiendo del Environmental consultant de d?nde se surte la receta y alguna farmacias pueden ofrecer precios m?s baratos. ? ?El sitio web www.goodrx.com tiene cupones para medicamentos de Airline pilot. Los precios aqu? no tienen en cuenta lo que podr?a costar con la ayuda del seguro (puede ser m?s barato con su seguro), pero el sitio web puede darle el precio si no utiliz? ning?n seguro.  ?- Puede imprimir el cup?n correspondiente y llevarlo con su receta a la farmacia.  ?- Tambi?n puede pasar por  nuestra oficina durante el horario de atenci?n regular y Charity fundraiser una tarjeta de cupones de GoodRx.  ?- Si necesita que su receta se env?e electr?nicamente a Chiropodist, informe a nuestra oficina a trav?s de MyChart de [place] o por tel?fono llamando al [phone] y presione la opci?n 4. ? ?

## 2021-07-19 ENCOUNTER — Ambulatory Visit: Payer: BLUE CROSS/BLUE SHIELD | Admitting: Allergy

## 2021-10-04 ENCOUNTER — Telehealth: Payer: Self-pay | Admitting: *Deleted

## 2021-10-04 NOTE — Patient Outreach (Signed)
  Care Coordination   10/04/2021 Name: Lynn Peters MRN: [id number] DOB: December 01, 1955   Care Coordination Outreach Attempts:  An unsuccessful telephone outreach was attempted today to offer the patient information about available care coordination services as a benefit of their health plan.   Follow Up Plan:  Additional outreach attempts will be made to offer the patient care coordination information and services.   Encounter Outcome:  No Answer  Care Coordination Interventions Activated:  Yes   Care Coordination Interventions:  No, not indicated    [place] [facility] [phone]

## 2021-10-23 ENCOUNTER — Ambulatory Visit (INDEPENDENT_AMBULATORY_CARE_PROVIDER_SITE_OTHER): Payer: Medicare Other | Admitting: Dermatology

## 2021-10-23 DIAGNOSIS — D2271 Melanocytic nevi of right lower limb, including hip: Secondary | ICD-10-CM

## 2021-10-23 DIAGNOSIS — D692 Other nonthrombocytopenic purpura: Secondary | ICD-10-CM

## 2021-10-23 DIAGNOSIS — Q825 Congenital non-neoplastic nevus: Secondary | ICD-10-CM | POA: Diagnosis not present

## 2021-10-23 DIAGNOSIS — L820 Inflamed seborrheic keratosis: Secondary | ICD-10-CM | POA: Diagnosis not present

## 2021-10-23 DIAGNOSIS — L814 Other melanin hyperpigmentation: Secondary | ICD-10-CM

## 2021-10-23 DIAGNOSIS — L821 Other seborrheic keratosis: Secondary | ICD-10-CM

## 2021-10-23 DIAGNOSIS — D2362 Other benign neoplasm of skin of left upper limb, including shoulder: Secondary | ICD-10-CM

## 2021-10-23 DIAGNOSIS — D2372 Other benign neoplasm of skin of left lower limb, including hip: Secondary | ICD-10-CM

## 2021-10-23 DIAGNOSIS — D1800 Hemangioma unspecified site: Secondary | ICD-10-CM

## 2021-10-23 DIAGNOSIS — Z1283 Encounter for screening for malignant neoplasm of skin: Secondary | ICD-10-CM

## 2021-10-23 DIAGNOSIS — L578 Other skin changes due to chronic exposure to nonionizing radiation: Secondary | ICD-10-CM

## 2021-10-23 DIAGNOSIS — D225 Melanocytic nevi of trunk: Secondary | ICD-10-CM

## 2021-10-23 DIAGNOSIS — D229 Melanocytic nevi, unspecified: Secondary | ICD-10-CM

## 2021-10-23 DIAGNOSIS — R220 Localized swelling, mass and lump, head: Secondary | ICD-10-CM

## 2021-10-23 NOTE — Progress Notes (Signed)
Follow-Up Visit   Subjective  Lynn Peters is a 66 y.o. female who presents for the following: Annual Exam (Patient denies family or personal history of skin cancer. Reports some itchy areas at backs. ).  The patient presents for Total-Body Skin Exam (TBSE) for skin cancer screening and mole check.  The patient has spots, moles and lesions to be evaluated, some may be new or changing and the patient has concerns that these could be cancer.  The following portions of the chart were reviewed this encounter and updated as appropriate:      Review of Systems: No other skin or systemic complaints except as noted in HPI or Assessment and Plan.   Objective  Well appearing patient in no apparent distress; mood and affect are within normal limits.  A full examination was performed including scalp, head, eyes, ears, nose, lips, neck, chest, axillae, abdomen, back, buttocks, bilateral upper extremities, bilateral lower extremities, hands, feet, fingers, toes, fingernails, and toenails. All findings within normal limits unless otherwise noted below.  upper back x 2 Erythematous stuck-on, waxy papule  right lateral mid back 10 x 7 mm medium dark brown speckled macule- pt states she has had this since childhood       left glabella 4 mm pink flesh firm papule (pt states chicken pox scar)  right lateral thigh 3 x 2 medium brown macule   left upper abdomen 4 mm brown macule   mid frontal hairline 0.6 cm bony nodule    Assessment & Plan  Inflamed seborrheic keratosis upper back x 2  Symptomatic, irritating, patient would like treated.  Discussed cryotherapy.   Patient deferred.   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  Observation.  Call clinic for new or changing lesions.  Recommend daily use of broad spectrum spf 30+ sunscreen to sun-exposed areas.   Recheck on f/up   Nevus (3) right lateral thigh; left upper abdomen; left  glabella  Benign-appearing.  Observation.  Call clinic for new or changing lesions.  Recommend daily use of broad spectrum spf 30+ sunscreen to sun-exposed areas.    Nodule of skin of head mid frontal hairline  Probable bony nodule, present for many years, no change.  Benign-appearing, Observation.  Lentigines - Scattered tan macules - Due to sun exposure - Benign-appearing, observe - Recommend daily broad spectrum sunscreen SPF 30+ to sun-exposed areas, reapply every 2 hours as needed. - Call for any changes  Seborrheic Keratoses - Stuck-on, waxy, tan-brown papules and/or plaques  - Benign-appearing - Discussed benign etiology and prognosis. - Observe - Call for any changes  Melanocytic Nevi - Tan-brown and/or pink-flesh-colored symmetric macules and papules - Benign appearing on exam today - Observation - Call clinic for new or changing moles - Recommend daily use of broad spectrum spf 30+ sunscreen to sun-exposed areas.   Dermatofibroma Left posterior shoulder , left anterior thigh  - Firm pink/brown papulenodule with dimple sign - Benign appearing - Call for any changes  Purpura - Chronic; persistent and recurrent.  Treatable, but not curable. - Violaceous macules and patches - Benign - Related to trauma, age, sun damage and/or use of blood thinners, chronic use of topical and/or oral steroids - Observe - Can use OTC arnica containing moisturizer such as Dermend Bruise Formula if desired - Call for worsening or other concerns  Hemangiomas - Red papules - Discussed benign nature - Observe - Call for any changes  Actinic Damage - Chronic condition, secondary to cumulative UV/sun exposure -  diffuse scaly erythematous macules with underlying dyspigmentation - Recommend daily broad spectrum sunscreen SPF 30+ to sun-exposed areas, reapply every 2 hours as needed.  - Staying in the shade or wearing long sleeves, sun glasses (UVA+UVB protection) and wide brim hats  (4-inch brim around the entire circumference of the hat) are also recommended for sun protection.  - Call for new or changing lesions.  Skin cancer screening performed today. Return if symptoms worsen or fail to improve, for recheck nevus R back. I, Lynn Peters, CMA, am acting as scribe for Lynn Patty, MD.  Documentation: I have reviewed the above documentation for accuracy and completeness, and I agree with the above.  Lynn Patty MD

## 2021-10-23 NOTE — Patient Instructions (Addendum)
Continue Elta MD Clear or Elta MD products in morning followed by Alastin nightly    For Bruising  Can use over the counter arnica containing moisturizer such as Dermend Bruise Formula if desired     Seborrheic Keratosis  What causes seborrheic keratoses? Seborrheic keratoses are harmless, common skin growths that first appear during adult life.  As time goes by, more growths appear.  Some people may develop a large number of them.  Seborrheic keratoses appear on both covered and uncovered body parts.  They are not caused by sunlight.  The tendency to develop seborrheic keratoses can be inherited.  They vary in color from skin-colored to gray, brown, or even black.  They can be either smooth or have a rough, warty surface.   Seborrheic keratoses are superficial and look as if they were stuck on the skin.  Under the microscope this type of keratosis looks like layers upon layers of skin.  That is why at times the top layer may seem to fall off, but the rest of the growth remains and re-grows.    Treatment Seborrheic keratoses do not need to be treated, but can easily be removed in the office.  Seborrheic keratoses often cause symptoms when they rub on clothing or jewelry.  Lesions can be in the way of shaving.  If they become inflamed, they can cause itching, soreness, or burning.  Removal of a seborrheic keratosis can be accomplished by freezing, burning, or surgery. If any spot bleeds, scabs, or grows rapidly, please return to have it checked, as these can be an indication of a skin cancer.    Melanoma ABCDEs  Melanoma is the most dangerous type of skin cancer, and is the leading cause of death from skin disease.  You are more likely to develop melanoma if you: Have light-colored skin, light-colored eyes, or red or blond hair Spend a lot of time in the sun Tan regularly, either outdoors or in a tanning bed Have had blistering sunburns, especially during childhood Have a close family  member who has had a melanoma Have atypical moles or large birthmarks  Early detection of melanoma is key since treatment is typically straightforward and cure rates are extremely high if we catch it early.   The first sign of melanoma is often a change in a mole or a new dark spot.  The ABCDE system is a way of remembering the signs of melanoma.  A for asymmetry:  The two halves do not match. B for border:  The edges of the growth are irregular. C for color:  A mixture of colors are present instead of an even brown color. D for diameter:  Melanomas are usually (but not always) greater than 24m - the size of a pencil eraser. E for evolution:  The spot keeps changing in size, shape, and color.  Please check your skin once per month between visits. You can use a small mirror in front and a large mirror behind you to keep an eye on the back side or your body.   If you see any new or changing lesions before your next follow-up, please call to schedule a visit.  Please continue daily skin protection including broad spectrum sunscreen SPF 30+ to sun-exposed areas, reapplying every 2 hours as needed when you're outdoors.   Staying in the shade or wearing long sleeves, sun glasses (UVA+UVB protection) and wide brim hats (4-inch brim around the entire circumference of the hat) are also recommended for sun protection.  Due to recent changes in healthcare laws, you may see results of your pathology and/or laboratory studies on MyChart before the doctors have had a chance to review them. We understand that in some cases there may be results that are confusing or concerning to you. Please understand that not all results are received at the same time and often the doctors may need to interpret multiple results in order to provide you with the best plan of care or course of treatment. Therefore, we ask that you please give [place] 2 business days to thoroughly review all your results before contacting the office  for clarification. Should we see a critical lab result, you will be contacted sooner.   If You Need Anything After Your Visit  If you have any questions or concerns for your doctor, please call our main line at [phone] and press option 4 to reach your doctor's medical assistant. If no one answers, please leave a voicemail as directed and we will return your call as soon as possible. Messages left after 4 pm will be answered the following business day.   You may also send [place] a message via [place]. We typically respond to MyChart messages within 1-2 business days.  For prescription refills, please ask your pharmacy to contact our office. Our fax number is [phone].  If you have an urgent issue when the clinic is closed that cannot wait until the next business day, you can page your doctor at the number below.    Please note that while we do our best to be available for urgent issues outside of office hours, we are not available 24/7.   If you have an urgent issue and are unable to reach [place], you may choose to seek medical care at your doctor's office, retail clinic, urgent care center, or emergency room.  If you have a medical emergency, please immediately call 911 or go to the emergency department.  Pager Numbers  - Dr. Nehemiah Massed: [phone]  - Dr. Laurence Ferrari: [phone]  - Dr. Nicole Kindred: [phone]  In the event of inclement weather, please call our main line at [phone] for an update on the status of any delays or closures.  Dermatology Medication Tips: Please keep the boxes that topical medications come in in order to help keep track of the instructions about where and how to use these. Pharmacies typically print the medication instructions only on the boxes and not directly on the medication tubes.   If your medication is too expensive, please contact our office at [phone] option 4 or send [place] a message through [place].   We are unable to tell what your co-pay for  medications will be in advance as this is different depending on your insurance coverage. However, we may be able to find a substitute medication at lower cost or fill out paperwork to get insurance to cover a needed medication.   If a prior authorization is required to get your medication covered by your insurance company, please allow [place] 1-2 business days to complete this process.  Drug prices often vary depending on where the prescription is filled and some pharmacies may offer cheaper prices.  The website www.goodrx.com contains coupons for medications through different pharmacies. The prices here do not account for what the cost may be with help from insurance (it may be cheaper with your insurance), but the website can give you the price if you did not use any insurance.  - You can print the associated coupon and take it  with your prescription to the pharmacy.  - You may also stop by our office during regular business hours and pick up a GoodRx coupon card.  - If you need your prescription sent electronically to a different pharmacy, notify our office through [facility] or by phone at [phone] option 4.     Si Usted Necesita Algo Despus de Su Visita  Tambin puede enviarnos un mensaje a travs de Pharmacist, community. Por lo general respondemos a los mensajes de MyChart en el transcurso de 1 a 2 das hbiles.  Para renovar recetas, por favor pida a su farmacia que se ponga en contacto con nuestra oficina. Harland Dingwall de fax es [place] [phone].  Si tiene un asunto urgente cuando la clnica est cerrada y que no puede esperar hasta el siguiente da hbil, puede llamar/localizar a su doctor(a) al nmero que aparece a continuacin.   Por favor, tenga en cuenta que aunque hacemos todo lo posible para estar disponibles para asuntos urgentes fuera del horario de [place], no estamos disponibles las 24 horas del da, los 7 das de la [place].   Si tiene un problema urgente y no puede  comunicarse con nosotros, puede optar por buscar atencin mdica  en el consultorio de su doctor(a), en una clnica privada, en un centro de atencin urgente o en una sala de emergencias.  Si tiene Engineering geologist, por favor llame inmediatamente al 911 o vaya a la sala de emergencias.  Nmeros de bper  - Dr. Nehemiah Massed: [phone]  - Dra. Moye: [phone]  - Dra. Nicole Kindred: [phone]  En caso de inclemencias del [place], por favor llame a Johnsie Kindred principal al [phone] para una actualizacin sobre el [place] de cualquier retraso o cierre.  Consejos para la medicacin en dermatologa: Por favor, guarde las cajas en las que vienen los medicamentos de uso tpico para ayudarle a seguir las instrucciones sobre dnde y cmo usarlos. Las farmacias generalmente imprimen las instrucciones del medicamento slo en las cajas y no directamente en los tubos del [place].   Si su medicamento es muy caro, por favor, pngase en contacto con Zigmund Daniel llamando al [phone] y presione la opcin 4 o envenos un mensaje a travs de Pharmacist, community.   No podemos decirle cul ser su copago por los medicamentos por adelantado ya que esto es diferente dependiendo de la cobertura de su seguro. Sin embargo, es posible que podamos encontrar un medicamento sustituto a Electrical engineer un formulario para que el seguro cubra el medicamento que se considera necesario.   Si se requiere una autorizacin previa para que su compaa de seguros [place] su medicamento, por favor permtanos de 1 a 2 das hbiles para completar este proceso.  Los precios de los medicamentos varan con frecuencia dependiendo del Environmental consultant de dnde se surte la receta y alguna farmacias pueden ofrecer precios ms baratos.  El sitio web www.goodrx.com tiene cupones para medicamentos de Airline pilot. Los precios aqu no tienen en cuenta lo que podra costar con la ayuda del seguro (puede ser ms barato con su seguro), pero  el sitio web puede darle el precio si no utiliz Research scientist (physical sciences).  - Puede imprimir el cupn correspondiente y llevarlo con su receta a la farmacia.  - Tambin puede pasar por nuestra oficina durante el horario de atencin regular y Charity fundraiser una tarjeta de cupones de GoodRx.  - Si necesita que su receta se enve electrnicamente a Chiropodist, informe a nuestra oficina a travs de MyChart de [facility]  o por telfono llamando al [phone] y presione la opcin 4.  

## 2021-11-13 ENCOUNTER — Other Ambulatory Visit: Payer: Self-pay

## 2021-11-13 DIAGNOSIS — D751 Secondary polycythemia: Secondary | ICD-10-CM

## 2021-11-14 ENCOUNTER — Inpatient Hospital Stay (HOSPITAL_BASED_OUTPATIENT_CLINIC_OR_DEPARTMENT_OTHER): Payer: Medicare Other | Admitting: Oncology

## 2021-11-14 ENCOUNTER — Inpatient Hospital Stay: Payer: Medicare Other

## 2021-11-14 ENCOUNTER — Inpatient Hospital Stay: Payer: Medicare Other | Attending: Oncology

## 2021-11-14 ENCOUNTER — Encounter: Payer: Self-pay | Admitting: Oncology

## 2021-11-14 VITALS — BP 124/84 | HR 72 | Temp 98.5°F | Resp 18 | Wt 145.0 lb

## 2021-11-14 DIAGNOSIS — L680 Hirsutism: Secondary | ICD-10-CM | POA: Insufficient documentation

## 2021-11-14 DIAGNOSIS — D751 Secondary polycythemia: Secondary | ICD-10-CM | POA: Diagnosis present

## 2021-11-14 DIAGNOSIS — J45909 Unspecified asthma, uncomplicated: Secondary | ICD-10-CM | POA: Diagnosis not present

## 2021-11-14 LAB — CBC WITH DIFFERENTIAL/PLATELET
Abs Immature Granulocytes: 0.02 10*3/uL (ref 0.00–0.07)
Basophils Absolute: 0.1 10*3/uL (ref 0.0–0.1)
Basophils Relative: 1 %
Eosinophils Absolute: 0.2 10*3/uL (ref 0.0–0.5)
Eosinophils Relative: 2 %
HCT: 44 % (ref 36.0–46.0)
Hemoglobin: 14.6 g/dL (ref 12.0–15.0)
Immature Granulocytes: 0 %
Lymphocytes Relative: 34 %
Lymphs Abs: 2.3 10*3/uL (ref 0.7–4.0)
MCH: 29.4 pg (ref 26.0–34.0)
MCHC: 33.2 g/dL (ref 30.0–36.0)
MCV: 88.5 fL (ref 80.0–100.0)
Monocytes Absolute: 0.5 10*3/uL (ref 0.1–1.0)
Monocytes Relative: 7 %
Neutro Abs: 3.8 10*3/uL (ref 1.7–7.7)
Neutrophils Relative %: 56 %
Platelets: 300 10*3/uL (ref 150–400)
RBC: 4.97 MIL/uL (ref 3.87–5.11)
RDW: 13.3 % (ref 11.5–15.5)
WBC: 6.9 10*3/uL (ref 4.0–10.5)
nRBC: 0 % (ref 0.0–0.2)

## 2021-11-14 NOTE — Assessment & Plan Note (Signed)
Improved.  Patient attributes to decrease inhaling steroid dose.  Continue follow up with endocrinology

## 2021-11-14 NOTE — Progress Notes (Signed)
Hematology/Oncology follow up note [facility] Telephone:([phone]) [phone] Fax:([phone]) [phone]   Patient Care Team: Burnard Hawthorne, FNP as PCP - General (Family Medicine) Kate Sable, MD as Consulting Physician (Cardiology)  ASSESSMENT & PLAN:   Erythrocytosis Her previous work-up is negative JAK2 V617F mutation negative, with reflex to other mutations CALR, MPL, JAK 2 Ex 12-15 mutations negative.  Erythrocytosis was considered most likely secondary to secondary etiology. Previous sleep study in 2019 showed no significant obstruction or central sleep disordered breathing.  Lung study history of asthma, per pulmonology Dr. Patsey Berthold, not explaining erythrocytosis.  Patient deferred familial erythrocytosis work-up [ send out test] due to concern of insurance coverage. Possibly due to increased testosterone level. Follow up with endocrinology Labs are reviewed and discussed with patient. Hb is within normal limits, stable. No need for phlebotomy  Hirsutism Improved.  Patient attributes to decrease inhaling steroid dose.  Continue follow up with endocrinology  Orders Placed This Encounter  Procedures   CBC with Differential/Platelet    Standing Status:   Future    Standing Expiration Date:   11/15/2022   Patient prefers to keep follow-up appointments with hematology. All questions were answered. The patient knows to call the clinic with any problems, questions or concerns.  Earlie Server, MD, PhD [facility] Hematology Oncology 11/14/2021   CHIEF COMPLAINTS/REASON FOR VISIT:  Follow up erythrocytosis  HISTORY OF PRESENTING ILLNESS:  Lynn Peters is a 66 y.o. female who was seen in consultation at the request of Burnard Hawthorne, FNP for evaluation of polycytosis/erythrocytosis Reviewed patient's recent lab work which was obtain by PCP.  04/07/2019 labs showed elevated hemoglobin at 15.7,  total white count 7.6, platelet counts 354,000. Erythrocytosis,  ischronic Onset, duration since at least 2017 at that time with a hemoglobin of 16. No aggravating or alleviating factors.   Associated signs or symptoms: Denies weight loss, fever, chills, fatigue, night sweats.   Context:  Smoking history: Never smoker.  She denies alcohol use.  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 History of blood clots: Denies Daytime somnolence: Denies Family history of polycythemia: Denies  Reports chronic abdominal pain.  Sometimes she has nonbloody diarrhea.  Patient was recently seen by gastroenterology Dr. Marius Ditch.  GI recommends stool studies to rule out infection, perform H. pylori breath test, recommend EGD and colonoscopy for further evaluation.  April 2022 she had right knee replacement.  She established care with endocrinology for hirsutism. She has increased testosterone level.  09/14/2020 US pelvic showed normal appearing ovaries bilaterally 09/21/2020 CT abdomen pelvis w/wo contrast showed  no adrenal mass or ovarian mass. She was recommended to followup in 3 months.   Hirsutism, increased testosterone level. She follows up with endocrinology. .  INTERVAL HISTORY Lynn Peters is a 66 y.o. female who has above history reviewed by me today presents for follow up visit for erythrocytosis Problems and complaints are listed below: Patient reports feeling well.   No new complaints. She exercises and has lost weight. She feels well.   Review of Systems  Constitutional:  Negative for appetite change, chills, fatigue and fever.  HENT:   Negative for hearing loss and voice change.   Eyes:  Negative for eye problems.  Respiratory:  Negative for chest tightness and cough.   Cardiovascular:  Negative for chest pain.  Gastrointestinal:  Negative for abdominal distention, abdominal pain and blood in stool.  Endocrine: Negative for hot flashes.  Genitourinary:  Negative for difficulty urinating  and frequency.   Musculoskeletal:  Negative for  arthralgias.  Skin:  Negative for itching and rash.  Neurological:  Negative for extremity weakness.  Hematological:  Negative for adenopathy.  Psychiatric/Behavioral:  Negative for confusion.     MEDICAL HISTORY:  Past Medical History:  Diagnosis Date   Allergy    Anxiety    Arthritis    Asthma    Chest pain    Chicken pox    Complication of anesthesia    Difficulty breathing after GYN procedure   Diverticulitis 2009   GERD (gastroesophageal reflux disease)    Pneumonia     SURGICAL HISTORY: Past Surgical History:  Procedure Laterality Date   ABDOMINAL HYSTERECTOMY  2004   partial; has cervix and ovaries per patient; done for fibroids, noncancerous   ANTERIOR CRUCIATE LIGAMENT REPAIR Right 2000   COLONOSCOPY WITH PROPOFOL N/A 04/28/2019   Procedure: COLONOSCOPY WITH PROPOFOL;  Surgeon: Lin Landsman, MD;  Location: [facility] ENDOSCOPY;  Service: Gastroenterology;  Laterality: N/A;   ESOPHAGOGASTRODUODENOSCOPY (EGD) WITH PROPOFOL N/A 04/28/2019   Procedure: ESOPHAGOGASTRODUODENOSCOPY (EGD) WITH PROPOFOL;  Surgeon: Lin Landsman, MD;  Location: [facility] ENDOSCOPY;  Service: Gastroenterology;  Laterality: N/A;   LAPAROSCOPY     TOTAL KNEE ARTHROPLASTY Right 06/02/2020   Procedure: RIGHT TOTAL KNEE ARTHROPLASTY;  Surgeon: Mcarthur Rossetti, MD;  Location: [facility] ORS;  Service: Orthopedics;  Laterality: Right;  Needs RNFA   UTERINE FIBROID SURGERY      SOCIAL HISTORY: Social History   Socioeconomic History   Marital status: Married    Spouse name: Not on file   Number of children: Not on file   Years of education: Not on file   Highest education level: Not on file  Occupational History   Not on file  Tobacco Use   Smoking status: Never    Passive exposure: Current   Smokeless tobacco: Never  Vaping Use   Vaping Use: Never used  Substance and Sexual Activity   Alcohol use: Yes    Alcohol/week: 0.0 standard drinks of alcohol    Comment: rare   Drug use: No    Sexual activity: Yes    Partners: Male    Birth control/protection: Surgical  Other Topics Concern   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      Diet- gym, knee limits running, paddleboating. Interval training 3x per week.       Diet-regular      Social Determinants of Health   Financial Resource Strain: Low Risk  (05/09/2021)   Overall Financial Resource Strain (CARDIA)    Difficulty of Paying Living Expenses: Not hard at all  Food Insecurity: No Food Insecurity (05/09/2021)   Hunger Vital Sign    Worried About Running Out of Food in the Last Year: Never true    Ran Out of Food in the Last Year: Never true  Transportation Needs: No Transportation Needs (05/09/2021)   PRAPARE - Hydrologist (Medical): No    Lack of Transportation (Non-Medical): No  Physical Activity: Not on file  Stress: No Stress Concern Present (05/09/2021)   Elim    Feeling of Stress : Not at all  Social Connections: Not on file  Intimate Partner Violence: Not At Risk (05/09/2021)   Humiliation, Afraid, Rape, and Kick questionnaire  Fear of Current or Ex-Partner: No    Emotionally Abused: No    Physically Abused: No    Sexually Abused: No    FAMILY HISTORY: Family History  Problem Relation Age of Onset   Alzheimer's disease Mother    Asthma Mother    Diabetes Mother    Hyperlipidemia Mother    Hypertension Mother    Stroke Mother 50   Heart disease Father        CHF   Diabetes Father    Hyperlipidemia Father    Asthma Sister    Diabetes Sister    Alcohol abuse Brother    Heart disease Brother    Breast cancer Neg Hx     ALLERGIES:  is allergic to pantoprazole sodium, tetracyclines & related, breo ellipta [fluticasone furoate-vilanterol], and flagyl [metronidazole].  MEDICATIONS:  Current  Outpatient Medications  Medication Sig Dispense Refill   albuterol (VENTOLIN HFA) 108 (90 Base) MCG/ACT inhaler INHALE 2 PUFFS INTO THE LUNGS EVERY 4 (FOUR) HOURS AS NEEDED FOR WHEEZING OR SHORTNESS OF BREATH. 18 g 2   Ascorbic Acid (VITAMIN C) 1000 MG tablet Take 1,000 mg by mouth daily.     Cetirizine HCl (ZYRTEC CHILDRENS ALLERGY PO) Take 5 mg by mouth at bedtime. liquid     Cholecalciferol (VITAMIN D3) 50 MCG (2000 UT) CAPS Take by mouth.     clobetasol (TEMOVATE) 0.05 % external solution Patient to pour solution into jar of CeraVe and mix well. Use twice daily to affected areas. Do not apply to face, groin or underarms. 50 mL 0   famotidine (PEPCID) 20 MG tablet Take 1 tablet (20 mg total) by mouth daily. 30 tablet 2   fluticasone (FLOVENT HFA) 110 MCG/ACT inhaler Inhale 1 puff into the lungs 2 (two) times daily. 1 each 4   Probiotic Product (SOLUBLE FIBER/PROBIOTICS PO) Take 1 capsule by mouth daily.     TURMERIC PO Take by mouth. 1,'000mg'$  once a day     vitamin B-12 (CYANOCOBALAMIN) 1000 MCG tablet Take 1,000 mcg by mouth daily.     clobetasol cream (TEMOVATE) 2.69 % Apply 1 application. topically 2 (two) times daily. Not face prn (Patient not taking: Reported on 06/01/2021) 60 g 0   PRESCRIPTION MEDICATION Inhale 2 puffs into the lungs in the morning and at bedtime. Flixotide Inhaler (Patient not taking: Reported on 11/14/2021)     No current facility-administered medications for this visit.     PHYSICAL EXAMINATION: ECOG PERFORMANCE STATUS: 1 - Symptomatic but completely ambulatory Vitals:   11/14/21 1408  BP: 124/84  Pulse: 72  Resp: 18  Temp: 98.5 F (36.9 C)   Filed Weights   11/14/21 1408  Weight: 145 lb (65.8 kg)    Physical Exam Constitutional:      General: She is not in acute distress. HENT:     Head: Normocephalic and atraumatic.  Eyes:     General: No scleral icterus. Cardiovascular:     Rate and Rhythm: Normal rate.  Pulmonary:     Effort: Pulmonary effort  is normal. No respiratory distress.     Breath sounds: No wheezing.  Abdominal:     General: Bowel sounds are normal. There is no distension.     Palpations: Abdomen is soft.  Musculoskeletal:        General: No deformity. Normal range of motion.     Cervical back: Normal range of motion and neck supple.  Skin:    Findings: No erythema.  Neurological:  Mental Status: She is alert and oriented to person, place, and time. Mental status is at baseline.     Cranial Nerves: No cranial nerve deficit.  Psychiatric:        Mood and Affect: Mood normal.     RADIOGRAPHIC STUDIES: I have personally reviewed the radiological images as listed and agreed with the findings in the report. No results found.   LABORATORY DATA:  I have reviewed the data as listed    Latest Ref Rng & Units 11/14/2021    1:37 PM 05/04/2021   10:47 AM 03/13/2021   12:10 PM  CBC  WBC 4.0 - 10.5 K/uL 6.9  6.6  7.4   Hemoglobin 12.0 - 15.0 g/dL 14.6  14.5  15.3   Hematocrit 36.0 - 46.0 % 44.0  43.5  47.6   Platelets 150 - 400 K/uL 300  331.0  312       Latest Ref Rng & Units 05/04/2021   10:47 AM 03/13/2021   12:10 PM 01/11/2021    8:56 PM  CMP  Glucose 70 - 99 mg/dL 87  106  97   BUN 6 - 23 mg/dL '11  10  10   '$ Creatinine 0.40 - 1.20 mg/dL 0.81  0.74  0.82   Sodium 135 - 145 mEq/L 137  139  138   Potassium 3.5 - 5.1 mEq/L 3.8  3.5  4.0   Chloride 96 - 112 mEq/L 103  104  105   CO2 19 - 32 mEq/L '30  26  28   '$ Calcium 8.4 - 10.5 mg/dL 9.6  9.4  9.5   Total Protein 6.0 - 8.3 g/dL 7.5     Total Bilirubin 0.2 - 1.2 mg/dL 0.6     Alkaline Phos 39 - 117 U/L 64     AST 0 - 37 U/L 18     ALT 0 - 35 U/L 14

## 2021-11-14 NOTE — Assessment & Plan Note (Addendum)
Her previous work-up is negative JAK2 V617F mutation negative, with reflex to other mutations CALR, MPL, JAK 2 Ex 12-15 mutations negative.  Erythrocytosis was considered most likely secondary to secondary etiology. Previous sleep study in 2019 showed no significant obstruction or central sleep disordered breathing.  Lung study history of asthma, per pulmonology Dr. Patsey Berthold, not explaining erythrocytosis.  Patient deferred familial erythrocytosis work-up [ send out test] due to concern of insurance coverage. Possibly due to increased testosterone level. Follow up with endocrinology Labs are reviewed and discussed with patient. Hb is within normal limits, stable. No need for phlebotomy

## 2021-12-06 ENCOUNTER — Encounter: Payer: Self-pay | Admitting: Family

## 2021-12-06 ENCOUNTER — Ambulatory Visit: Payer: Medicare Other | Admitting: Allergy

## 2021-12-06 ENCOUNTER — Other Ambulatory Visit: Payer: Self-pay | Admitting: Family

## 2021-12-06 DIAGNOSIS — Z1231 Encounter for screening mammogram for malignant neoplasm of breast: Secondary | ICD-10-CM

## 2021-12-07 ENCOUNTER — Other Ambulatory Visit: Payer: Self-pay | Admitting: Family

## 2021-12-07 DIAGNOSIS — N644 Mastodynia: Secondary | ICD-10-CM

## 2021-12-24 ENCOUNTER — Other Ambulatory Visit: Payer: Medicare Other

## 2021-12-28 ENCOUNTER — Encounter: Payer: Self-pay | Admitting: Family

## 2021-12-31 ENCOUNTER — Other Ambulatory Visit: Payer: Self-pay

## 2021-12-31 DIAGNOSIS — J4530 Mild persistent asthma, uncomplicated: Secondary | ICD-10-CM

## 2021-12-31 NOTE — Telephone Encounter (Signed)
Patient states she is following up on her MyChart message.

## 2022-01-10 ENCOUNTER — Ambulatory Visit
Admission: RE | Admit: 2022-01-10 | Discharge: 2022-01-10 | Disposition: A | Discharge status: Home / Self Care | Payer: Medicare Other | Source: Ambulatory Visit | Attending: Family | Admitting: Family

## 2022-01-10 DIAGNOSIS — N644 Mastodynia: Secondary | ICD-10-CM

## 2022-01-29 ENCOUNTER — Ambulatory Visit (INDEPENDENT_AMBULATORY_CARE_PROVIDER_SITE_OTHER): Payer: Medicare Other | Admitting: Dermatology

## 2022-01-29 VITALS — BP 129/81

## 2022-01-29 DIAGNOSIS — Q825 Congenital non-neoplastic nevus: Secondary | ICD-10-CM | POA: Diagnosis not present

## 2022-01-29 NOTE — Patient Instructions (Signed)
Due to recent changes in healthcare laws, you may see results of your pathology and/or laboratory studies on MyChart before the doctors have had a chance to review them. We understand that in some cases there may be results that are confusing or concerning to you. Please understand that not all results are received at the same time and often the doctors may need to interpret multiple results in order to provide you with the best plan of care or course of treatment. Therefore, we ask that you please give us 2 business days to thoroughly review all your results before contacting the office for clarification. Should we see a critical lab result, you will be contacted sooner.   If You Need Anything After Your Visit  If you have any questions or concerns for your doctor, please call our main line at [phone] and press option 4 to reach your doctor's medical assistant. If no one answers, please leave a voicemail as directed and we will return your call as soon as possible. Messages left after 4 pm will be answered the following business day.   You may also send us a message via MyChart. We typically respond to MyChart messages within 1-2 business days.  For prescription refills, please ask your pharmacy to contact our office. Our fax number is [phone].  If you have an urgent issue when the clinic is closed that cannot wait until the next business day, you can page your doctor at the number below.    Please note that while we do our best to be available for urgent issues outside of office hours, we are not available 24/7.   If you have an urgent issue and are unable to reach us, you may choose to seek medical care at your doctor's office, retail clinic, urgent care center, or emergency room.  If you have a medical emergency, please immediately call 911 or go to the emergency department.  Pager Numbers  - Dr. Kowalski: [phone]  - Dr. Moye: [phone]  - Dr. Stewart:  [phone]  In the event of inclement weather, please call our main line at [phone] for an update on the status of any delays or closures.  Dermatology Medication Tips: Please keep the boxes that topical medications come in in order to help keep track of the instructions about where and how to use these. Pharmacies typically print the medication instructions only on the boxes and not directly on the medication tubes.   If your medication is too expensive, please contact our office at [phone] option 4 or send us a message through MyChart.   We are unable to tell what your co-pay for medications will be in advance as this is different depending on your insurance coverage. However, we may be able to find a substitute medication at lower cost or fill out paperwork to get insurance to cover a needed medication.   If a prior authorization is required to get your medication covered by your insurance company, please allow us 1-2 business days to complete this process.  Drug prices often vary depending on where the prescription is filled and some pharmacies may offer cheaper prices.  The website www.goodrx.com contains coupons for medications through different pharmacies. The prices here do not account for what the cost may be with help from insurance (it may be cheaper with your insurance), but the website can give you the price if you did not use any insurance.  - You can print the associated coupon and take it with   your prescription to the pharmacy.  - You may also stop by our office during regular business hours and pick up a GoodRx coupon card.  - If you need your prescription sent electronically to a different pharmacy, notify our office through [facility] MyChart or by phone at [phone] option 4.     Si Usted Necesita Algo Despus de Su Visita  Tambin puede enviarnos un mensaje a travs de MyChart. Por lo general respondemos a los mensajes de MyChart en el transcurso de 1 a 2  das hbiles.  Para renovar recetas, por favor pida a su farmacia que se ponga en contacto con nuestra oficina. Nuestro nmero de fax es el [phone].  Si tiene un asunto urgente cuando la clnica est cerrada y que no puede esperar hasta el siguiente da hbil, puede llamar/localizar a su doctor(a) al nmero que aparece a continuacin.   Por favor, tenga en cuenta que aunque hacemos todo lo posible para estar disponibles para asuntos urgentes fuera del horario de oficina, no estamos disponibles las 24 horas del da, los 7 das de la semana.   Si tiene un problema urgente y no puede comunicarse con nosotros, puede optar por buscar atencin mdica  en el consultorio de su doctor(a), en una clnica privada, en un centro de atencin urgente o en una sala de emergencias.  Si tiene una emergencia mdica, por favor llame inmediatamente al 911 o vaya a la sala de emergencias.  Nmeros de bper  - Dr. Kowalski: [phone]  - Dra. Moye: [phone]  - Dra. Stewart: [phone]  En caso de inclemencias del tiempo, por favor llame a nuestra lnea principal al [phone] para una actualizacin sobre el estado de cualquier retraso o cierre.  Consejos para la medicacin en dermatologa: Por favor, guarde las cajas en las que vienen los medicamentos de uso tpico para ayudarle a seguir las instrucciones sobre dnde y cmo usarlos. Las farmacias generalmente imprimen las instrucciones del medicamento slo en las cajas y no directamente en los tubos del medicamento.   Si su medicamento es muy caro, por favor, pngase en contacto con nuestra oficina llamando al [phone] y presione la opcin 4 o envenos un mensaje a travs de MyChart.   No podemos decirle cul ser su copago por los medicamentos por adelantado ya que esto es diferente dependiendo de la cobertura de su seguro. Sin embargo, es posible que podamos encontrar un medicamento sustituto a menor costo o llenar un formulario para que el  seguro cubra el medicamento que se considera necesario.   Si se requiere una autorizacin previa para que su compaa de seguros cubra su medicamento, por favor permtanos de 1 a 2 das hbiles para completar este proceso.  Los precios de los medicamentos varan con frecuencia dependiendo del lugar de dnde se surte la receta y alguna farmacias pueden ofrecer precios ms baratos.  El sitio web www.goodrx.com tiene cupones para medicamentos de diferentes farmacias. Los precios aqu no tienen en cuenta lo que podra costar con la ayuda del seguro (puede ser ms barato con su seguro), pero el sitio web puede darle el precio si no utiliz ningn seguro.  - Puede imprimir el cupn correspondiente y llevarlo con su receta a la farmacia.  - Tambin puede pasar por nuestra oficina durante el horario de atencin regular y recoger una tarjeta de cupones de GoodRx.  - Si necesita que su receta se enve electrnicamente a una farmacia diferente, informe a nuestra oficina a travs de MyChart de [place]   o por telfono llamando al [phone] y presione la opcin 4.  

## 2022-01-29 NOTE — Progress Notes (Signed)
   Follow-Up Visit   Subjective  Lynn Peters is a 66 y.o. female who presents for the following: Follow-up. Recheck congenital nevus.  No changes per pt.    The following portions of the chart were reviewed this encounter and updated as appropriate:       Review of Systems:  No other skin or systemic complaints except as noted in HPI or Assessment and Plan.  Objective  Well appearing patient in no apparent distress; mood and affect are within normal limits.  A focused examination was performed including back. Relevant physical exam findings are noted in the Assessment and Plan.  Right Lower Back 10 x 6 mm medium dark brown speckled macule- pt states she has had this since childhood, no changes when compared to baseline photo.    Assessment & Plan  Congenital non-neoplastic nevus Right Lower Back  Benign appearing. Stable compared to previous photo. Observation. Call clinic for new or changing lesions.  Recommend daily use of broad spectrum spf 30+ sunscreen to sun-exposed areas.     Return in about 9 months (around 10/31/2022) for TBSE.  IJamesetta Orleans, CMA, am acting as scribe for Brendolyn Patty, MD .  Documentation: I have reviewed the above documentation for accuracy and completeness, and I agree with the above.  Brendolyn Patty MD

## 2022-02-25 ENCOUNTER — Encounter: Payer: Self-pay | Admitting: Family

## 2022-02-27 ENCOUNTER — Ambulatory Visit (INDEPENDENT_AMBULATORY_CARE_PROVIDER_SITE_OTHER): Payer: Medicare Other | Admitting: Orthopaedic Surgery

## 2022-02-27 ENCOUNTER — Ambulatory Visit (INDEPENDENT_AMBULATORY_CARE_PROVIDER_SITE_OTHER): Payer: Medicare Other

## 2022-02-27 ENCOUNTER — Encounter: Payer: Self-pay | Admitting: Orthopaedic Surgery

## 2022-02-27 DIAGNOSIS — Z96651 Presence of right artificial knee joint: Secondary | ICD-10-CM

## 2022-02-27 NOTE — Progress Notes (Signed)
Office Visit Note   Patient: Lynn Peters           Date of Birth: [birth year]           MRN: [id number] Visit Date: 02/27/2022              Requested by: Burnard Hawthorne, FNP [address] [place],  [place] [address] PCP: Burnard Hawthorne, FNP   Assessment & Plan: Visit Diagnoses:  1. History of total right knee replacement   2. Status post total right knee replacement     Plan: Will send her for quad strengthening right knee.  Prescriptions given for [facility] is patient's been there in the past.  She will follow-up with [place] in 6 weeks see what type of response she had.  Questions were encouraged and answered by Dr. Ninfa Linden myself.  If she fails therapy may require revision with Poly exchange for upsizing.  Follow-Up Instructions: Return in about 6 weeks (around 04/10/2022).   Orders:  Orders Placed This Encounter  Procedures   XR Knee 1-2 Views Right   No orders of the defined types were placed in this encounter.     Procedures: No procedures performed   Clinical Data: No additional findings.   Subjective: Chief Complaint  Patient presents with   Right Knee - Follow-up    HPI Patient 67 year old female comes in today with right knee pain.  History of right total knee arthroplasty by Dr. Delilah Shan 06/02/2020.  She states she was doing well until about 2 weeks ago whenever she squatted and had pain in her knee since that time she has had increased pain lateral aspect of her knee.  She notes that the knee has a clicking in it now.  She denies any painful popping.  No mechanical symptoms and does feel that the knee is not as stable as it was.  She does note some swelling.  Pain overall is slowly improving.  Prior to this she was doing very well and walking greater than 10 miles a week. Review of Systems See HPI otherwise negative or noncontributory  Objective: Vital Signs: There were no vitals taken for this visit.  Physical Exam Constitutional:       Appearance: She is not ill-appearing or diaphoretic.  Cardiovascular:     Rate and Rhythm: Normal rate.  Pulmonary:     Effort: Pulmonary effort is normal.  Neurological:     Mental Status: She is alert and oriented to person, place, and time.  Psychiatric:        Mood and Affect: Mood normal.     Ortho Exam Right knee full range of motion.  No abnormal warmth erythema or effusion.  No instability with varus stressing.  Valgus stressing there is slight opening.  Anterior drawer is slight translation.  There is palpable click with anterior drawer.  Nontender about the patella and patellofemoral and patella tibial tendons.  There is no ecchymosis about the knee.  Nontender along medial and collateral ligaments.  Quad atrophy both legs. Specialty Comments:  No specialty comments available.  Imaging: XR Knee 1-2 Views Right  Result Date: 02/27/2022 Right knee 2 views: Knee is well located.  Knee arthroplasty components well-seated.  No acute fractures or acute findings.    PMFS History: Patient Active Problem List   Diagnosis Date Noted   Bronchitis 01/08/2021   Ankle swelling 01/01/2021   Low back pain 01/01/2021   Atherosclerosis of aorta ([place]) 11/06/2020   GERD (gastroesophageal  reflux disease)    Hirsutism 09/08/2020   Status post right knee replacement 06/02/2020   Erythrocytosis 05/07/2020   Fatigue 05/07/2020   Routine physical examination 05/03/2020   Unilateral primary osteoarthritis, right knee 02/16/2020   Abnormal CT scan, gastrointestinal tract    Diarrhea    Suprapubic pain 11/10/2018   Disorder of ureter 11/10/2018   Abdominal pain 10/23/2018   Arthritis 09/16/2017   Hyperlipidemia LDL goal <160 08/30/2017   Apneic episode 08/29/2017   Atypical chest pain 08/29/2017   Acquired bilateral flat feet 08/20/2017   Onychomycosis 08/20/2017   Reflux esophagitis 02/16/2017   Acute left-sided low back pain with left-sided sciatica 09/16/2016   Dysuria 08/15/2016    Overweight (BMI 25.0-29.9) 08/15/2016   Allergic rhinitis due to pollen 08/05/2016   PVC (premature ventricular contraction) 03/29/2016   Mild persistent asthma 10/04/2015   Abnormal Pap smear of cervix 10/04/2015   Colon cancer screening 02/28/2015   Family history of diabetes mellitus 02/16/2015   Past Medical History:  Diagnosis Date   Allergy    Anxiety    Arthritis    Asthma    Chest pain    Chicken pox    Complication of anesthesia    Difficulty breathing after GYN procedure   Diverticulitis 2009   GERD (gastroesophageal reflux disease)    Pneumonia     Family History  Problem Relation Age of Onset   Alzheimer's disease Mother    Asthma Mother    Diabetes Mother    Hyperlipidemia Mother    Hypertension Mother    Stroke Mother 67   Heart disease Father        CHF   Diabetes Father    Hyperlipidemia Father    Asthma Sister    Diabetes Sister    Alcohol abuse Brother    Heart disease Brother    Breast cancer Neg Hx     Past Surgical History:  Procedure Laterality Date   ABDOMINAL HYSTERECTOMY  2004   partial; has cervix and ovaries per patient; done for fibroids, noncancerous   ANTERIOR CRUCIATE LIGAMENT REPAIR Right 2000   COLONOSCOPY WITH PROPOFOL N/A 04/28/2019   Procedure: COLONOSCOPY WITH PROPOFOL;  Surgeon: Lin Landsman, MD;  Location: [facility] ENDOSCOPY;  Service: Gastroenterology;  Laterality: N/A;   ESOPHAGOGASTRODUODENOSCOPY (EGD) WITH PROPOFOL N/A 04/28/2019   Procedure: ESOPHAGOGASTRODUODENOSCOPY (EGD) WITH PROPOFOL;  Surgeon: Lin Landsman, MD;  Location: [facility] ENDOSCOPY;  Service: Gastroenterology;  Laterality: N/A;   LAPAROSCOPY     TOTAL KNEE ARTHROPLASTY Right 06/02/2020   Procedure: RIGHT TOTAL KNEE ARTHROPLASTY;  Surgeon: Mcarthur Rossetti, MD;  Location: [facility] ORS;  Service: Orthopedics;  Laterality: Right;  Needs RNFA   UTERINE FIBROID SURGERY     Social History   Occupational History   Not on file  Tobacco Use   Smoking  status: Never    Passive exposure: Current   Smokeless tobacco: Never  Vaping Use   Vaping Use: Never used  Substance and Sexual Activity   Alcohol use: Yes    Alcohol/week: 0.0 standard drinks of alcohol    Comment: rare   Drug use: No   Sexual activity: Yes    Partners: Male    Birth control/protection: Surgical

## 2022-02-27 NOTE — Progress Notes (Signed)
I have seen the patient and examined her as well as discussed the treatment plan.  I agree with Benita Stabile, PA-C's note with his assessment and plan.

## 2022-03-07 ENCOUNTER — Ambulatory Visit: Payer: Medicare Other | Admitting: Physician Assistant

## 2022-04-10 ENCOUNTER — Encounter: Payer: Self-pay | Admitting: Orthopaedic Surgery

## 2022-04-10 ENCOUNTER — Ambulatory Visit (INDEPENDENT_AMBULATORY_CARE_PROVIDER_SITE_OTHER): Payer: Medicare Other | Admitting: Orthopaedic Surgery

## 2022-04-10 DIAGNOSIS — Z96651 Presence of right artificial knee joint: Secondary | ICD-10-CM | POA: Diagnosis not present

## 2022-04-10 NOTE — Progress Notes (Signed)
The patient is a 67 year old female well-known to [place].  She comes in today for follow-up after having been through outpatient physical therapy to strengthen her right knee.  She has a history of a right total knee arthroplasty that we did in 2022.  She had done well but had had some symptoms of laxity in the knee.  She states physical therapy has done great for her and she does not have the same symptoms that she had.  She reports good range of motion and strength.  My exam there is now a lot of plate in her right knee at all today.  She has excellent range of motion and feels stable.  At this point follow-up can be as needed.  She will continue a home exercise program and if that she does note any changes she will let us know.

## 2022-04-18 ENCOUNTER — Encounter: Payer: Self-pay | Admitting: Radiology

## 2022-05-03 ENCOUNTER — Telehealth: Payer: Self-pay | Admitting: Family

## 2022-05-03 NOTE — Telephone Encounter (Signed)
Contacted Lynn Peters to schedule their annual wellness visit. Appointment made for 05/13/2022.  Thank you,  [place] Direct dial  [phone]

## 2022-05-13 ENCOUNTER — Ambulatory Visit (INDEPENDENT_AMBULATORY_CARE_PROVIDER_SITE_OTHER): Payer: Medicare Other

## 2022-05-13 VITALS — Ht 65.5 in | Wt 144.0 lb

## 2022-05-13 DIAGNOSIS — Z0000 Encounter for general adult medical examination without abnormal findings: Secondary | ICD-10-CM | POA: Diagnosis not present

## 2022-05-13 NOTE — Progress Notes (Signed)
Subjective:   Lynn Peters is a 67 y.o. female who presents for Medicare Annual (Subsequent) preventive examination.  Review of Systems    No ROS.  Medicare Wellness Virtual Visit.  Visual/audio telehealth visit, UTA vital signs.   See social history for additional risk factors.   Cardiac Risk Factors include: advanced age (>58men, >39 women)     Objective:    Today's Vitals   05/13/22 1510  Weight: 144 lb (65.3 kg)  Height: 5' 5.5" (1.664 m)   Body mass index is 23.6 kg/m.     05/13/2022    3:14 PM 05/09/2021   11:33 AM 03/13/2021   12:10 PM 01/11/2021    8:55 PM 11/14/2020    1:42 PM 06/02/2020   11:05 AM 06/02/2020    5:53 AM  Advanced Directives  Does Patient Have a Medical Advance Directive? Yes Yes Yes No Yes Yes Yes  Type of Paramedic of [place];Living will [place];Living will Healthcare Power of [place];Living will [place];Living will [place];Living will  Does patient want to make changes to medical advance directive? No - Patient declined No - Patient declined   Yes (ED - Information included in AVS) No - Patient declined   Copy of [place] in Chart? Yes - validated most recent copy scanned in chart (See row information) Yes - validated most recent copy scanned in chart (See row information)    No - copy requested No - copy requested  Would patient like information on creating a medical advance directive?    No - Patient declined       Current Medications (verified) Outpatient Encounter Medications as of 05/13/2022  Medication Sig   albuterol (VENTOLIN HFA) 108 (90 Base) MCG/ACT inhaler INHALE 2 PUFFS INTO THE LUNGS EVERY 4 (FOUR) HOURS AS NEEDED FOR WHEEZING OR SHORTNESS OF BREATH.   Ascorbic Acid (VITAMIN C) 1000 MG tablet Take 1,000 mg by mouth daily.   Cetirizine HCl (ZYRTEC CHILDRENS ALLERGY PO) Take 5 mg by mouth at bedtime.  liquid   Cholecalciferol (VITAMIN D3) 50 MCG (2000 UT) CAPS Take by mouth.   clobetasol (TEMOVATE) 0.05 % external solution Patient to pour solution into jar of CeraVe and mix well. Use twice daily to affected areas. Do not apply to face, groin or underarms.   clobetasol cream (TEMOVATE) [id number] % Apply 1 application. topically 2 (two) times daily. Not face prn (Patient not taking: Reported on 06/01/2021)   famotidine (PEPCID) 20 MG tablet Take 1 tablet (20 mg total) by mouth daily.   fluticasone (FLOVENT HFA) 110 MCG/ACT inhaler Inhale 1 puff into the lungs 2 (two) times daily.   PRESCRIPTION MEDICATION Inhale 2 puffs into the lungs in the morning and at bedtime. Flixotide Inhaler (Patient not taking: Reported on 11/14/2021)   Probiotic Product (SOLUBLE FIBER/PROBIOTICS PO) Take 1 capsule by mouth daily.   TURMERIC PO Take by mouth. 1,000mg  once a day   vitamin B-12 (CYANOCOBALAMIN) 1000 MCG tablet Take 1,000 mcg by mouth daily.   No facility-administered encounter medications on file as of 05/13/2022.    Allergies (verified) Pantoprazole sodium, Tetracyclines & related, Breo ellipta [fluticasone furoate-vilanterol], and Flagyl [metronidazole]   History: Past Medical History:  Diagnosis Date   Allergy    Anxiety    Arthritis    Asthma    Chest pain    Chicken pox    Complication of anesthesia  Difficulty breathing after GYN procedure   Diverticulitis 2009   GERD (gastroesophageal reflux disease)    Pneumonia    Past Surgical History:  Procedure Laterality Date   ABDOMINAL HYSTERECTOMY  2004   partial; has cervix and ovaries per patient; done for fibroids, noncancerous   ANTERIOR CRUCIATE LIGAMENT REPAIR Right 2000   COLONOSCOPY WITH PROPOFOL N/A 04/28/2019   Procedure: COLONOSCOPY WITH PROPOFOL;  Surgeon: Lin Landsman, MD;  Location: [facility] ENDOSCOPY;  Service: Gastroenterology;  Laterality: N/A;   ESOPHAGOGASTRODUODENOSCOPY (EGD) WITH PROPOFOL N/A 04/28/2019   Procedure:  ESOPHAGOGASTRODUODENOSCOPY (EGD) WITH PROPOFOL;  Surgeon: Lin Landsman, MD;  Location: [facility] ENDOSCOPY;  Service: Gastroenterology;  Laterality: N/A;   LAPAROSCOPY     TOTAL KNEE ARTHROPLASTY Right 06/02/2020   Procedure: RIGHT TOTAL KNEE ARTHROPLASTY;  Surgeon: Mcarthur Rossetti, MD;  Location: [facility] ORS;  Service: Orthopedics;  Laterality: Right;  Needs RNFA   UTERINE FIBROID SURGERY     Family History  Problem Relation Age of Onset   Alzheimer's disease Mother    Asthma Mother    Diabetes Mother    Hyperlipidemia Mother    Hypertension Mother    Stroke Mother 55   Heart disease Father        CHF   Diabetes Father    Hyperlipidemia Father    Asthma Sister    Diabetes Sister    Alcohol abuse Brother    Heart disease Brother    Breast cancer Neg Hx    Social History   Socioeconomic History   Marital status: Married    Spouse name: Not on file   Number of children: Not on file   Years of education: Not on file   Highest education level: Not on file  Occupational History   Not on file  Tobacco Use   Smoking status: Never    Passive exposure: Current   Smokeless tobacco: Never  Vaping Use   Vaping Use: Never used  Substance and Sexual Activity   Alcohol use: Yes    Alcohol/week: 0.0 standard drinks of alcohol    Comment: rare   Drug use: No   Sexual activity: Yes    Partners: Male    Birth control/protection: Surgical  Other Topics Concern   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      Diet- gym, knee limits running, paddleboating. Interval training 3x per week.       Diet-regular      Social Determinants of Health   Financial Resource Strain: Low Risk  (05/09/2022)   Overall Financial Resource Strain (CARDIA)    Difficulty of Paying Living Expenses: Not very hard  Food Insecurity: No Food Insecurity (05/09/2022)   Hunger Vital Sign    Worried About  Running Out of Food in the Last Year: Never true    Ran Out of Food in the Last Year: Never true  Transportation Needs: No Transportation Needs (05/09/2022)   PRAPARE - Hydrologist (Medical): No    Lack of Transportation (Non-Medical): No  Physical Activity: Sufficiently Active (05/09/2022)   Exercise Vital Sign    Days of Exercise per Week: 7 days    Minutes of Exercise per Session: 60 min  Stress: No Stress Concern Present (05/09/2022)   [place]    Feeling of  Stress : Only a little  Social Connections: Unknown (05/09/2022)   Social Connection and Isolation Panel [NHANES]    Frequency of Communication with Friends and Family: Twice a week    Frequency of Social Gatherings with Friends and Family: Twice a week    Attends Religious Services: Not on Advertising copywriter or Organizations: Yes    Attends Archivist Meetings: 1 to 4 times per year    Marital Status: Living with partner    Tobacco Counseling Counseling given: Not Answered   Clinical Intake:  Pre-visit preparation completed: Yes           How often do you need to have someone help you when you read instructions, pamphlets, or other written materials from your doctor or pharmacy?: 1 - Never    Interpreter Needed?: No      Activities of Daily Living    05/09/2022    1:26 PM  In your present state of health, do you have any difficulty performing the following activities:  Hearing? 0  Vision? 0  Difficulty concentrating or making decisions? 0  Walking or climbing stairs? 0  Dressing or bathing? 0  Doing errands, shopping? 0  Preparing Food and eating ? N  Using the Toilet? N  In the past six months, have you accidently leaked urine? Y  Comment Stress incontinence  Do you have problems with loss of bowel control? N  Managing your Medications? N  Managing your Finances? N  Housekeeping or  managing your Housekeeping? N    Patient Care Team: Burnard Hawthorne, FNP as PCP - General (Family Medicine) Kate Sable, MD as Consulting Physician (Cardiology)  Indicate any recent Medical Services you may have received from other than [facility] providers in the past year (date may be approximate).     Assessment:   This is a routine wellness examination for Lynn Peters.  Patient Medicare AWV questionnaire was completed by the patient on 05/09/22, I have confirmed that all information answered by patient is correct and no changes since this date.   I connected with  Lynn Peters on 05/13/22 by a audio enabled telemedicine application and verified that I am speaking with the correct person using two identifiers.  Patient Location: Home  Provider Location: Office/Clinic  I discussed the limitations of evaluation and management by telemedicine. The patient expressed understanding and agreed to proceed.   Hearing/Vision screen Hearing Screening - Comments:: Patient is able to hear conversational tones without difficulty.  No issues reported.   Vision Screening - Comments:: Wears corrective lenses They have seen their ophthalmologist in the last 12 months.    Dietary issues and exercise activities discussed: Current Exercise Habits: Home exercise routine, Type of exercise: walking, Intensity: Mild   Goals Addressed               This Visit's Progress     Patient Stated     Weight (lb) < 144 lb (65.3 kg) (pt-stated)   144 lb (65.3 kg)     I want to lose about 5lbs  Healthy diet Stay active Weight goal 140lb       Depression Screen    05/13/2022    3:19 PM 05/24/2021    1:26 PM 05/09/2021   11:35 AM 05/04/2021   10:15 AM 05/03/2020    2:33 PM 02/14/2017   12:36 PM 01/08/2016   10:21 AM  PHQ 2/9 Scores  PHQ - 2 Score 0 0 0 0 2  2 0  PHQ- 9 Score     4 5     Fall Risk    05/09/2022    1:26 PM 05/24/2021    1:26 PM 05/09/2021   11:34 AM 05/04/2021   10:15 AM 05/03/2020     1:46 PM  Fall Risk   Falls in the past year? 0 0  1 0  Number falls in past yr: 0 0  0   Injury with Fall? 0 0  1   Risk for fall due to :  No Fall Risks  History of fall(s)   Follow up Falls evaluation completed;Falls prevention discussed Falls evaluation completed Falls evaluation completed Falls evaluation completed Falls evaluation completed    FALL RISK PREVENTION PERTAINING TO THE HOME: Home free of loose throw rugs in walkways, pet beds, electrical cords, etc? Yes  Adequate lighting in your home to reduce risk of falls? Yes   ASSISTIVE DEVICES UTILIZED TO PREVENT FALLS: Life alert? No  Use of a cane, walker or w/c? No   TIMED UP AND GO: Was the test performed? No .   Cognitive Function:        05/13/2022    3:20 PM  6CIT Screen  What Year? 0 points  What month? 0 points  What time? 0 points  Count back from 20 0 points  Months in reverse 0 points  Repeat phrase 0 points  Total Score 0 points    Immunizations Immunization History  Administered Date(s) Administered   Fluad Quad(high Dose 65+) 03/20/2020, 11/05/2020   Influenza,inj,Quad PF,6+ Mos 11/27/2016, 12/09/2017, 10/16/2018   PFIZER Comirnaty(Gray Top)Covid-19 Tri-Sucrose Vaccine 05/12/2020   PFIZER(Purple Top)SARS-COV-2 Vaccination 11/14/2019   PNEUMOCOCCAL CONJUGATE-20 05/03/2020   Pneumococcal Polysaccharide-23 09/27/2016   TDAP status: Due, Education has been provided regarding the importance of this vaccine. Advised may receive this vaccine at local pharmacy or Health Dept. Aware to provide a copy of the vaccination record if obtained from local pharmacy or Health Dept. Verbalized acceptance and understanding.  Shingrix Completed?: No.    Education has been provided regarding the importance of this vaccine. Patient has been advised to call insurance company to determine out of pocket expense if they have not yet received this vaccine. Advised may also receive vaccine at local pharmacy or Health Dept.  Verbalized acceptance and understanding.  Screening Tests Health Maintenance  Topic Date Due   DTaP/Tdap/Td (1 - Tdap) Never done   COVID-19 Vaccine (3 - Pfizer risk series) 05/29/2022 (Originally 06/09/2020)   Zoster Vaccines- Shingrix (1 of 2) 08/12/2022 (Originally 03/09/1974)   INFLUENZA VACCINE  09/12/2022   Medicare Annual Wellness (AWV)  05/13/2023   MAMMOGRAM  01/11/2024   COLONOSCOPY (Pts 45-66yrs Insurance coverage will need to be confirmed)  04/28/2026   Pneumonia Vaccine 43+ Years old  Completed   DEXA SCAN  Completed   Hepatitis C Screening  Completed   HPV VACCINES  Aged Out    Health Maintenance Health Maintenance Due  Topic Date Due   DTaP/Tdap/Td (1 - Tdap) Never done   Lung Cancer Screening: (Low Dose CT Chest recommended if Age 89-80 years, 30 pack-year currently smoking OR have quit w/in 15years.) does not qualify.   Hepatitis C Screening: Completed 12/2015.  Vision Screening: Recommended annual ophthalmology exams for early detection of glaucoma and other disorders of the eye.  Dental Screening: Recommended annual dental exams for proper oral hygiene  Community Resource Referral / Chronic Care Management: CRR required this visit?  No   CCM  required this visit?  No      Plan:     I have personally reviewed and noted the following in the patient's chart:   Medical and social history Use of alcohol, tobacco or illicit drugs  Current medications and supplements including opioid prescriptions. Patient is not currently taking opioid prescriptions. Functional ability and status Nutritional status Physical activity Advanced directives List of other physicians Hospitalizations, surgeries, and ER visits in previous 12 months Vitals Screenings to include cognitive, depression, and falls Referrals and appointments  In addition, I have reviewed and discussed with patient certain preventive protocols, quality metrics, and best practice recommendations. A  written personalized care plan for preventive services as well as general preventive health recommendations were provided to patient.     Leta Jungling, LPN   [id number]

## 2022-05-13 NOTE — Patient Instructions (Addendum)
Lynn Peters , Thank you for taking time to come for your Medicare Wellness Visit. I appreciate your ongoing commitment to your health goals. Please review the following plan we discussed and let me know if I can assist you in the future.   These are the goals we discussed:  Goals       Patient Stated     Weight (lb) < 144 lb (65.3 kg) (pt-stated)      I want to lose about 5lbs  Healthy diet Stay active Weight goal 140lb        This is a list of the screening recommended for you and due dates:  Health Maintenance  Topic Date Due   DTaP/Tdap/Td vaccine (1 - Tdap) Never done   COVID-19 Vaccine (3 - Pfizer risk series) 05/29/2022*   Zoster (Shingles) Vaccine (1 of 2) 08/12/2022*   Flu Shot  09/12/2022   Medicare Annual Wellness Visit  05/13/2023   Mammogram  01/11/2024   Colon Cancer Screening  04/28/2026   Pneumonia Vaccine  Completed   DEXA scan (bone density measurement)  Completed   Hepatitis C Screening: USPSTF Recommendation to screen - Ages 16-79 yo.  Completed   HPV Vaccine  Aged Out  *Topic was postponed. The date shown is not the original due date.    Advanced directives: on file  Next appointment: Follow up in one year for your annual wellness visit    Preventive Care 65 Years and Older, Female Preventive care refers to lifestyle choices and visits with your health care provider that can promote health and wellness. What does preventive care include? A yearly physical exam. This is also called an annual well check. Dental exams once or twice a year. Routine eye exams. Ask your health care provider how often you should have your eyes checked. Personal lifestyle choices, including: Daily care of your teeth and gums. Regular physical activity. Eating a healthy diet. Avoiding tobacco and drug use. Limiting alcohol use. Practicing safe sex. Taking low-dose aspirin every day. Taking vitamin and mineral supplements as recommended by your health care provider. What  happens during an annual well check? The services and screenings done by your health care provider during your annual well check will depend on your age, overall health, lifestyle risk factors, and family history of disease. Counseling  Your health care provider may ask you questions about your: Alcohol use. Tobacco use. Drug use. Emotional well-being. Home and relationship well-being. Sexual activity. Eating habits. History of falls. Memory and ability to understand (cognition). Work and work Statistician. Reproductive health. Screening  You may have the following tests or measurements: Height, weight, and BMI. Blood pressure. Lipid and cholesterol levels. These may be checked every 5 years, or more frequently if you are over 12 years old. Skin check. Lung cancer screening. You may have this screening every year starting at age 77 if you have a 30-pack-year history of smoking and currently smoke or have quit within the past 15 years. Fecal occult blood test (FOBT) of the stool. You may have this test every year starting at age 43. Flexible sigmoidoscopy or colonoscopy. You may have a sigmoidoscopy every 5 years or a colonoscopy every 10 years starting at age 40. Hepatitis C blood test. Hepatitis B blood test. Sexually transmitted disease (STD) testing. Diabetes screening. This is done by checking your blood sugar (glucose) after you have not eaten for a while (fasting). You may have this done every 1-3 years. Bone density scan. This is done to  screen for osteoporosis. You may have this done starting at age 49. Mammogram. This may be done every 1-2 years. Talk to your health care provider about how often you should have regular mammograms. Talk with your health care provider about your test results, treatment options, and if necessary, the need for more tests. Vaccines  Your health care provider may recommend certain vaccines, such as: Influenza vaccine. This is recommended every  year. Tetanus, diphtheria, and acellular pertussis (Tdap, Td) vaccine. You may need a Td booster every 10 years. Zoster vaccine. You may need this after age 24. Pneumococcal 13-valent conjugate (PCV13) vaccine. One dose is recommended after age 68. Pneumococcal polysaccharide (PPSV23) vaccine. One dose is recommended after age 52. Talk to your health care provider about which screenings and vaccines you need and how often you need them. This information is not intended to replace advice given to you by your health care provider. Make sure you discuss any questions you have with your health care provider. Document Released: 02/24/2015 Document Revised: 10/18/2015 Document Reviewed: 11/29/2014 Elsevier Interactive Patient Education  2017 [place] Prevention in the Home Falls can cause injuries. They can happen to people of all ages. There are many things you can do to make your home safe and to help prevent falls. What can I do on the outside of my home? Regularly fix the edges of walkways and driveways and fix any cracks. Remove anything that might make you trip as you walk through a door, such as a raised step or threshold. Trim any bushes or trees on the path to your home. Use bright outdoor lighting. Clear any walking paths of anything that might make someone trip, such as rocks or tools. Regularly check to see if handrails are loose or broken. Make sure that both sides of any steps have handrails. Any raised decks and porches should have guardrails on the edges. Have any leaves, snow, or ice cleared regularly. Use sand or salt on walking paths during winter. Clean up any spills in your garage right away. This includes oil or grease spills. What can I do in the bathroom? Use night lights. Install grab bars by the toilet and in the tub and shower. Do not use towel bars as grab bars. Use non-skid mats or decals in the tub or shower. If you need to sit down in the shower, use a  plastic, non-slip stool. Keep the floor dry. Clean up any water that spills on the floor as soon as it happens. Remove soap buildup in the tub or shower regularly. Attach bath mats securely with double-sided non-slip rug tape. Do not have throw rugs and other things on the floor that can make you trip. What can I do in the bedroom? Use night lights. Make sure that you have a light by your bed that is easy to reach. Do not use any sheets or blankets that are too big for your bed. They should not hang down onto the floor. Have a firm chair that has side arms. You can use this for support while you get dressed. Do not have throw rugs and other things on the floor that can make you trip. What can I do in the kitchen? Clean up any spills right away. Avoid walking on wet floors. Keep items that you use a lot in easy-to-reach places. If you need to reach something above you, use a strong step stool that has a grab bar. Keep electrical cords out of the way.  Do not use floor polish or wax that makes floors slippery. If you must use wax, use non-skid floor wax. Do not have throw rugs and other things on the floor that can make you trip. What can I do with my stairs? Do not leave any items on the stairs. Make sure that there are handrails on both sides of the stairs and use them. Fix handrails that are broken or loose. Make sure that handrails are as long as the stairways. Check any carpeting to make sure that it is firmly attached to the stairs. Fix any carpet that is loose or worn. Avoid having throw rugs at the top or bottom of the stairs. If you do have throw rugs, attach them to the floor with carpet tape. Make sure that you have a light switch at the top of the stairs and the bottom of the stairs. If you do not have them, ask someone to add them for you. What else can I do to help prevent falls? Wear shoes that: Do not have high heels. Have rubber bottoms. Are comfortable and fit you  well. Are closed at the toe. Do not wear sandals. If you use a stepladder: Make sure that it is fully opened. Do not climb a closed stepladder. Make sure that both sides of the stepladder are locked into place. Ask someone to hold it for you, if possible. Clearly mark and make sure that you can see: Any grab bars or handrails. First and last steps. Where the edge of each step is. Use tools that help you move around (mobility aids) if they are needed. These include: Canes. Walkers. Scooters. Crutches. Turn on the lights when you go into a dark area. Replace any light bulbs as soon as they burn out. Set up your furniture so you have a clear path. Avoid moving your furniture around. If any of your floors are uneven, fix them. If there are any pets around you, be aware of where they are. Review your medicines with your doctor. Some medicines can make you feel dizzy. This can increase your chance of falling. Ask your doctor what other things that you can do to help prevent falls. This information is not intended to replace advice given to you by your health care provider. Make sure you discuss any questions you have with your health care provider. Document Released: 11/24/2008 Document Revised: 07/06/2015 Document Reviewed: 03/04/2014 Elsevier Interactive Patient Education  2017 [employer].

## 2022-05-14 ENCOUNTER — Telehealth: Payer: Self-pay

## 2022-05-14 ENCOUNTER — Other Ambulatory Visit: Payer: Self-pay

## 2022-05-14 ENCOUNTER — Emergency Department
Admission: EM | Admit: 2022-05-14 | Discharge: 2022-05-14 | Disposition: A | Discharge status: Home / Self Care | Payer: Medicare Other | Attending: Emergency Medicine | Admitting: Emergency Medicine

## 2022-05-14 ENCOUNTER — Emergency Department: Payer: Medicare Other

## 2022-05-14 ENCOUNTER — Ambulatory Visit: Payer: Medicare Other | Admitting: Nurse Practitioner

## 2022-05-14 DIAGNOSIS — J180 Bronchopneumonia, unspecified organism: Secondary | ICD-10-CM | POA: Diagnosis not present

## 2022-05-14 DIAGNOSIS — J45909 Unspecified asthma, uncomplicated: Secondary | ICD-10-CM | POA: Diagnosis not present

## 2022-05-14 DIAGNOSIS — R0602 Shortness of breath: Secondary | ICD-10-CM | POA: Diagnosis present

## 2022-05-14 LAB — CBC
HCT: 47.4 % — ABNORMAL HIGH (ref 36.0–46.0)
Hemoglobin: 15.4 g/dL — ABNORMAL HIGH (ref 12.0–15.0)
MCH: 29.4 pg (ref 26.0–34.0)
MCHC: 32.5 g/dL (ref 30.0–36.0)
MCV: 90.5 fL (ref 80.0–100.0)
Platelets: 335 10*3/uL (ref 150–400)
RBC: 5.24 MIL/uL — ABNORMAL HIGH (ref 3.87–5.11)
RDW: 13.2 % (ref 11.5–15.5)
WBC: 9.4 10*3/uL (ref 4.0–10.5)
nRBC: 0 % (ref 0.0–0.2)

## 2022-05-14 LAB — BASIC METABOLIC PANEL
Anion gap: 9 (ref 5–15)
BUN: 12 mg/dL (ref 8–23)
CO2: 28 mmol/L (ref 22–32)
Calcium: 9.5 mg/dL (ref 8.9–10.3)
Chloride: 102 mmol/L (ref 98–111)
Creatinine, Ser: 0.79 mg/dL (ref 0.44–1.00)
GFR, Estimated: >60 mL/min (ref >60)
Glucose, Bld: 98 mg/dL (ref 70–99)
Potassium: 3.8 mmol/L (ref 3.5–5.1)
Sodium: 139 mmol/L (ref 135–145)

## 2022-05-14 LAB — TROPONIN I (HIGH SENSITIVITY)
Troponin I (High Sensitivity): 3 ng/L (ref <18)
Troponin I (High Sensitivity): 3 ng/L (ref <18)

## 2022-05-14 MED ORDER — AZITHROMYCIN 250 MG PO TABS: ORAL_TABLET | ORAL | 0 refills | Status: AC

## 2022-05-14 MED ORDER — AZITHROMYCIN 200 MG/5ML PO SUSR: ORAL | 0 refills | Status: DC

## 2022-05-14 MED ORDER — SODIUM CHLORIDE 0.9 % IV SOLN
1.0000 g | Freq: Once | INTRAVENOUS | Status: AC
  Administered 2022-05-14: 1 g via INTRAVENOUS
  Filled 2022-05-14: qty 10

## 2022-05-14 MED ORDER — DEXAMETHASONE SODIUM PHOSPHATE 10 MG/ML IJ SOLN
10.0000 mg | Freq: Once | INTRAMUSCULAR | Status: DC
  Filled 2022-05-14: qty 1

## 2022-05-14 MED ORDER — BENZONATATE 100 MG PO CAPS: 100.0000 mg | ORAL_CAPSULE | Freq: Three times a day (TID) | ORAL | 0 refills | Status: AC | PRN

## 2022-05-14 MED ORDER — AZITHROMYCIN 250 MG PO TABS: ORAL_TABLET | ORAL | 0 refills | Status: DC

## 2022-05-14 MED ORDER — AMOXICILLIN-POT CLAVULANATE 250-62.5 MG/5ML PO SUSR: 500.0000 mg | Freq: Two times a day (BID) | ORAL | 0 refills | Status: AC

## 2022-05-14 MED ORDER — IPRATROPIUM-ALBUTEROL 0.5-2.5 (3) MG/3ML IN SOLN
3.0000 mL | Freq: Once | RESPIRATORY_TRACT | Status: AC
  Administered 2022-05-14: 3 mL via RESPIRATORY_TRACT
  Filled 2022-05-14: qty 3

## 2022-05-14 MED ORDER — AMOXICILLIN-POT CLAVULANATE 875-125 MG PO TABS: 1.0000 | ORAL_TABLET | Freq: Two times a day (BID) | ORAL | 0 refills | Status: AC

## 2022-05-14 MED ORDER — AMOXICILLIN-POT CLAVULANATE 875-125 MG PO TABS: 1.0000 | ORAL_TABLET | Freq: Two times a day (BID) | ORAL | 0 refills | Status: DC

## 2022-05-14 MED ORDER — IOHEXOL 350 MG/ML SOLN
75.0000 mL | Freq: Once | INTRAVENOUS | Status: AC | PRN
  Administered 2022-05-14: 75 mL via INTRAVENOUS

## 2022-05-14 NOTE — Telephone Encounter (Signed)
Spoke to pt and she stated that she was on her way to ER as we spoke. I told pt I would check back in with her tomorrow to see how she was doing.

## 2022-05-14 NOTE — ED Triage Notes (Signed)
Pt via POV from home. Pt c/o productive cough for the past week states that she now is SOB. Pt has a hx of asthma and tried her nebulizer at home without any relief. Pt is A&OX4 and NAD

## 2022-05-14 NOTE — Progress Notes (Deleted)
  Tomasita Morrow, NP-C Phone: [phone]  Lynn Peters is a 67 y.o. female who presents today for ***  Respiratory illness:  Cough- ***  Congestion- ***   Sinus- ***   Chest- ***  Post nasal drip- ***  Sore throat- ***  Shortness of breath- ***  Fever- ***  Fatigue/Myalgia- *** Headache- *** Nausea/Vomiting- *** Taste disturbance- ***  Smell disturbance- ***  Covid exposure- ***  Covid vaccination- ***  Flu vaccination- ***  Medications- ***   Social History   Tobacco Use  Smoking Status Never   Passive exposure: Current  Smokeless Tobacco Never    Current Outpatient Medications on File Prior to Visit  Medication Sig Dispense Refill   albuterol (VENTOLIN HFA) 108 (90 Base) MCG/ACT inhaler INHALE 2 PUFFS INTO THE LUNGS EVERY 4 (FOUR) HOURS AS NEEDED FOR WHEEZING OR SHORTNESS OF BREATH. 18 g 2   Ascorbic Acid (VITAMIN C) 1000 MG tablet Take 1,000 mg by mouth daily.     Cetirizine HCl (ZYRTEC CHILDRENS ALLERGY PO) Take 5 mg by mouth at bedtime. liquid     Cholecalciferol (VITAMIN D3) 50 MCG (2000 UT) CAPS Take by mouth.     clobetasol (TEMOVATE) 0.05 % external solution Patient to pour solution into jar of CeraVe and mix well. Use twice daily to affected areas. Do not apply to face, groin or underarms. 50 mL 0   clobetasol cream (TEMOVATE) [id number] % Apply 1 application. topically 2 (two) times daily. Not face prn (Patient not taking: Reported on 06/01/2021) 60 g 0   famotidine (PEPCID) 20 MG tablet Take 1 tablet (20 mg total) by mouth daily. 30 tablet 2   fluticasone (FLOVENT HFA) 110 MCG/ACT inhaler Inhale 1 puff into the lungs 2 (two) times daily. 1 each 4   PRESCRIPTION MEDICATION Inhale 2 puffs into the lungs in the morning and at bedtime. Flixotide Inhaler (Patient not taking: Reported on 11/14/2021)     Probiotic Product (SOLUBLE FIBER/PROBIOTICS PO) Take 1 capsule by mouth daily.     TURMERIC PO Take by mouth. 1,000mg  once a day     vitamin B-12 (CYANOCOBALAMIN) 1000 MCG  tablet Take 1,000 mcg by mouth daily.     No current facility-administered medications on file prior to visit.     ROS see history of present illness  Objective  Physical Exam There were no vitals filed for this visit.  BP Readings from Last 3 Encounters:  01/29/22 129/81  11/14/21 124/84  06/01/21 138/70   Wt Readings from Last 3 Encounters:  05/13/22 144 lb (65.3 kg)  11/14/21 145 lb (65.8 kg)  06/01/21 146 lb (66.2 kg)    Physical Exam   Assessment/Plan: Please see individual problem list.  There are no diagnoses linked to this encounter.   Health Maintenance: ***  No follow-ups on file.   Tomasita Morrow, NP-C [place]

## 2022-05-14 NOTE — ED Provider Notes (Signed)
[facility] Provider Note  Patient Contact: 5:25 PM (approximate)   History   Shortness of Breath   HPI  Lynn Peters is a 67 y.o. female with a history of asthma, pneumonia, GERD and anxiety, presents to the emergency department with pleuritic chest discomfort and shortness of breath as well as nonproductive cough for the past week.  Patient became concerned because she typically has relief of her shortness of breath with albuterol inhaler and has not experienced relief since starting medications.  She denies nausea, vomiting or abdominal pain.  No fever or chills at home.      Physical Exam   Triage Vital Signs: ED Triage Vitals [05/14/22 1522]  Enc Vitals Group     BP (!) 164/87     Pulse Rate 72     Resp 20     Temp 97.9 F (36.6 C)     Temp Source Oral     SpO2 93 %     Weight 144 lb (65.3 kg)     Height 5\' 5"  (1.651 m)     Head Circumference      Peak Flow      Pain Score 4     Pain Loc      Pain Edu?      Excl. in [place]?     Most recent vital signs: Vitals:   05/14/22 1522 05/14/22 2004  BP: (!) 164/87 (!) 158/84  Pulse: 72 74  Resp: 20 18  Temp: 97.9 F (36.6 C) 98 F (36.7 C)  SpO2: 93% 94%     General: Alert and in no acute distress. Eyes:  PERRL. EOMI. Head: No acute traumatic findings ENT:      Nose: No congestion/rhinnorhea.      Mouth/Throat: Mucous membranes are moist. Neck: No stridor. No cervical spine tenderness to palpation. Hematological/Lymphatic/Immunilogical: No cervical lymphadenopathy. Cardiovascular:  Good peripheral perfusion Respiratory: Normal respiratory effort without tachypnea or retractions. Lungs CTAB. Good air entry to the bases with no decreased or absent breath sounds. Gastrointestinal: Bowel sounds 4 quadrants. Soft and nontender to palpation. No guarding or rigidity. No palpable masses. No distention. No CVA tenderness. Musculoskeletal: Full range of motion to all extremities.  Neurologic:  No  gross focal neurologic deficits are appreciated.  Skin:   No rash noted    ED Results / Procedures / Treatments   Labs (all labs ordered are listed, but only abnormal results are displayed) Labs Reviewed  CBC - Abnormal; Notable for the following components:      Result Value   RBC 5.24 (*)    Hemoglobin 15.4 (*)    HCT 47.4 (*)    All other components within normal limits  BASIC METABOLIC PANEL  TROPONIN I (HIGH SENSITIVITY)  TROPONIN I (HIGH SENSITIVITY)      RADIOLOGY  I personally viewed and evaluated these images as part of my medical decision making, as well as reviewing the written report by the radiologist.  ED Provider Interpretation: No acute abnormality on chest x-ray.    PROCEDURES:  Critical Care performed: No  Procedures   MEDICATIONS ORDERED IN ED: Medications  dexamethasone (DECADRON) injection 10 mg (has no administration in time range)  ipratropium-albuterol (DUONEB) 0.5-2.5 (3) MG/3ML nebulizer solution 3 mL (3 mLs Nebulization Given 05/14/22 1753)  iohexol (OMNIPAQUE) 350 MG/ML injection 75 mL (75 mLs Intravenous Contrast Given 05/14/22 1815)  cefTRIAXone (ROCEPHIN) 1 g in sodium chloride 0.9 % 100 mL IVPB (0 g Intravenous Stopped 05/14/22 1900)  IMPRESSION / MDM / ASSESSMENT AND PLAN / ED COURSE  I reviewed the triage vital signs and the nursing notes.                              Assessment and plan Shortness of breath 67 year old female presents to the emergency department with shortness of breath and some pleuritic chest pain that became worse this morning but patient has been symptomatic for for the past week.  Patient was hypertensive at triage.  She was tachypneic with O2 sats at 93% on room air.  On exam, patient had no increased work of breathing and was able to speak in complete sentences without perceived breathlessness.  She had no wheezing or other adventitious lung sounds on exam.  Differential diagnosis includes PE, pneumonia,  asthma exacerbation...  Will obtain CTA of the chest and will administer DuoNeb and will reassess.   CTA of the chest shows no evidence of PE but did indicate right-sided bronchopneumonia.  Patient was given IV Rocephin in the emergency department and a dose of Decadron.  Patient was discharged with Augmentin and azithromycin as well as Tessalon Perles.  Return precautions were given to return with new or worsening symptoms.  All patient questions were answered.   FINAL CLINICAL IMPRESSION(S) / ED DIAGNOSES   Final diagnoses:  Bronchopneumonia     Rx / DC Orders   ED Discharge Orders          Ordered    amoxicillin-clavulanate (AUGMENTIN) 875-125 MG tablet  2 times daily,   Status:  Discontinued        05/14/22 1936    azithromycin (ZITHROMAX Z-PAK) 250 MG tablet  Status:  Discontinued        05/14/22 1936    amoxicillin-clavulanate (AUGMENTIN) 875-125 MG tablet  2 times daily        05/14/22 1959    azithromycin (ZITHROMAX Z-PAK) 250 MG tablet        05/14/22 1959    amoxicillin-clavulanate (AUGMENTIN) 250-62.5 MG/5ML suspension  2 times daily        05/14/22 1959    azithromycin (ZITHROMAX) 200 MG/5ML suspension        05/14/22 1959    benzonatate (TESSALON PERLES) 100 MG capsule  3 times daily PRN        05/14/22 1959             Note:  This document was prepared using Dragon voice recognition software and may include unintentional dictation errors.   Karren Cobble 05/14/22 2013    Harvest Dark, MD 05/14/22 2216

## 2022-05-14 NOTE — Discharge Instructions (Signed)
Take Augmentin and azithromycin as directed

## 2022-05-14 NOTE — Telephone Encounter (Signed)
Patient had an appointment scheduled today with Tomasita Morrow, NP, but provider had an emergency and had to leave.  I called patient to reschedule.  Patient originally was being seen for a cough, but patient states she is now experiencing shortness of breath.  Patient states she was getting ready to come to our office.  Patient refused to speak with Triage Nurse, stating, "What can they do for me?  I need to be seen.".  I checked our other [facility] offices and there are no office visits available today.  I relayed this information to patient and let her know she can be seen in an Urgent Care or Emergency Room.  Patient was not happy and states, "Thanks for nothing."

## 2022-05-15 ENCOUNTER — Encounter: Payer: Medicare Other | Admitting: Family

## 2022-05-20 ENCOUNTER — Telehealth: Payer: Self-pay

## 2022-05-20 NOTE — Telephone Encounter (Signed)
        Patient  visited [facility] on 05/14/2022  for shortness of breath.   Telephone encounter attempt :  1st  A HIPAA compliant voice message was left requesting a return call.  Instructed patient to call back at [phone].   Kenna Seward [facility]  [facility] Population Health Community Resource Care Guide   [email or web address]  ?? [phone]   Website: [email or web address]  [email or web address]

## 2022-05-20 NOTE — Telephone Encounter (Signed)
     Patient  visit on 05/14/2022  at [facility] was for shortness of breath.  Have you been able to follow up with your primary care physician? Yes  The patient was or was not able to obtain any needed medicine or equipment. Patient was able to obtain medication.  Are there diet recommendations that you are having difficulty following? No  Patient expresses understanding of discharge instructions and education provided has no other needs at this time. Yes   Lynn Peters [facility]  [facility] Population Health Community Resource Care Guide   [email or web address]  ?? [phone]   Website: [email or web address]  [email or web address]

## 2022-05-24 ENCOUNTER — Encounter: Payer: Self-pay | Admitting: Family

## 2022-05-24 ENCOUNTER — Other Ambulatory Visit: Payer: Self-pay | Admitting: Family

## 2022-05-24 ENCOUNTER — Ambulatory Visit (INDEPENDENT_AMBULATORY_CARE_PROVIDER_SITE_OTHER): Payer: Medicare Other | Admitting: Family

## 2022-05-24 ENCOUNTER — Ambulatory Visit
Admission: RE | Admit: 2022-05-24 | Discharge: 2022-05-24 | Disposition: A | Discharge status: Home / Self Care | Payer: Medicare Other | Source: Ambulatory Visit | Attending: Family | Admitting: Family

## 2022-05-24 VITALS — BP 122/82 | HR 81 | Temp 97.7°F | Ht 65.0 in | Wt 145.0 lb

## 2022-05-24 DIAGNOSIS — R140 Abdominal distension (gaseous): Secondary | ICD-10-CM | POA: Diagnosis present

## 2022-05-24 DIAGNOSIS — R42 Dizziness and giddiness: Secondary | ICD-10-CM | POA: Diagnosis not present

## 2022-05-24 DIAGNOSIS — I3139 Other pericardial effusion (noninflammatory): Secondary | ICD-10-CM | POA: Diagnosis not present

## 2022-05-24 DIAGNOSIS — Z13220 Encounter for screening for lipoid disorders: Secondary | ICD-10-CM | POA: Diagnosis not present

## 2022-05-24 DIAGNOSIS — Z136 Encounter for screening for cardiovascular disorders: Secondary | ICD-10-CM

## 2022-05-24 DIAGNOSIS — R1011 Right upper quadrant pain: Secondary | ICD-10-CM | POA: Diagnosis present

## 2022-05-24 DIAGNOSIS — I951 Orthostatic hypotension: Secondary | ICD-10-CM

## 2022-05-24 DIAGNOSIS — J189 Pneumonia, unspecified organism: Secondary | ICD-10-CM

## 2022-05-24 DIAGNOSIS — K219 Gastro-esophageal reflux disease without esophagitis: Secondary | ICD-10-CM

## 2022-05-24 DIAGNOSIS — R1031 Right lower quadrant pain: Secondary | ICD-10-CM

## 2022-05-24 LAB — COMPREHENSIVE METABOLIC PANEL
ALT: 29 U/L (ref 0–35)
AST: 24 U/L (ref 0–37)
Albumin: 4.2 g/dL (ref 3.5–5.2)
Alkaline Phosphatase: 67 U/L (ref 39–117)
BUN: 10 mg/dL (ref 6–23)
CO2: 28 mEq/L (ref 19–32)
Calcium: 9.4 mg/dL (ref 8.4–10.5)
Chloride: 105 mEq/L (ref 96–112)
Creatinine, Ser: 0.91 mg/dL (ref 0.40–1.20)
GFR: 65.42 mL/min (ref >60.00)
Glucose, Bld: 81 mg/dL (ref 70–99)
Potassium: 3.9 mEq/L (ref 3.5–5.1)
Sodium: 140 mEq/L (ref 135–145)
Total Bilirubin: 0.8 mg/dL (ref 0.2–1.2)
Total Protein: 7 g/dL (ref 6.0–8.3)

## 2022-05-24 LAB — CBC WITH DIFFERENTIAL/PLATELET
Basophils Absolute: 0.1 10*3/uL (ref 0.0–0.1)
Basophils Relative: 0.7 % (ref 0.0–3.0)
Eosinophils Absolute: 0.1 10*3/uL (ref 0.0–0.7)
Eosinophils Relative: 1.2 % (ref 0.0–5.0)
HCT: 44.2 % (ref 36.0–46.0)
Hemoglobin: 14.9 g/dL (ref 12.0–15.0)
Lymphocytes Relative: 29.4 % (ref 12.0–46.0)
Lymphs Abs: 2.1 10*3/uL (ref 0.7–4.0)
MCHC: 33.7 g/dL (ref 30.0–36.0)
MCV: 89.8 fl (ref 78.0–100.0)
Monocytes Absolute: 0.6 10*3/uL (ref 0.1–1.0)
Monocytes Relative: 8.7 % (ref 3.0–12.0)
Neutro Abs: 4.4 10*3/uL (ref 1.4–7.7)
Neutrophils Relative %: 60 % (ref 43.0–77.0)
Platelets: 313 10*3/uL (ref 150.0–400.0)
RBC: 4.92 Mil/uL (ref 3.87–5.11)
RDW: 13.4 % (ref 11.5–15.5)
WBC: 7.3 10*3/uL (ref 4.0–10.5)

## 2022-05-24 LAB — LIPID PANEL
Cholesterol: 177 mg/dL (ref 0–200)
HDL: 60.1 mg/dL (ref >39.00)
LDL Cholesterol: 92 mg/dL (ref 0–99)
NonHDL: 117.32
Total CHOL/HDL Ratio: 3
Triglycerides: 128 mg/dL (ref 0.0–149.0)
VLDL: 25.6 mg/dL (ref 0.0–40.0)

## 2022-05-24 MED ORDER — FLUTICASONE PROPIONATE HFA 44 MCG/ACT IN AERO: 2.0000 | INHALATION_SPRAY | Freq: Two times a day (BID) | RESPIRATORY_TRACT | 12 refills | Status: DC

## 2022-05-24 MED ORDER — IOHEXOL 300 MG/ML  SOLN
85.0000 mL | Freq: Once | INTRAMUSCULAR | Status: AC | PRN
  Administered 2022-05-24: 85 mL via INTRAVENOUS

## 2022-05-24 MED ORDER — IOHEXOL 9 MG/ML PO SOLN
500.0000 mL | ORAL | Status: AC
  Administered 2022-05-24 (×2): 500 mL via ORAL

## 2022-05-24 NOTE — Assessment & Plan Note (Signed)
Suboptimal control.  Advised to resume Pepcid AC nightly

## 2022-05-24 NOTE — Progress Notes (Signed)
Assessment & Plan:  Dizziness Assessment & Plan: Episode last week.  Reviewed EKG in the ED 05/14/2022 and previous to 04/30/2021 without significant changes.  Patient orthostatic on exam.  Pending ultrasound of carotid arteries.  Close follow-up.  Consider Zio monitor if persists.   Orders: -     ECHOCARDIOGRAM COMPLETE; Future -     US Carotid Bilateral; Future  Pericardial effusion -     ECHOCARDIOGRAM COMPLETE; Future  Pneumonia of right lung due to infectious organism, unspecified part of lung Assessment & Plan: Symptomatically feeling better after Augmentin, azithromycin.  Chest x-ray due in 6 weeks time.  Pending echocardiogram due to trace pericardial fluid seen on CTA.  Orders: -     CBC with Differential/Platelet  Right lower quadrant abdominal pain Assessment & Plan: Right-sided.  History of diverticulitis.  History of hysterectomy.  Pending labs including CA125, CT abdomen pelvis to evlauate for appendicitis, diverticulitis and ovarian pathology.  Close follow-up   Orders: -     CA 125 -     Comprehensive metabolic panel -     CT ABDOMEN PELVIS W CONTRAST  Encounter for lipid screening for cardiovascular disease -     Lipid panel  Abdominal distension -     CA 125 -     CT ABDOMEN PELVIS W CONTRAST  Right upper quadrant abdominal pain Assessment & Plan: Right-sided.  History of diverticulitis.  History of hysterectomy.  Pending labs including CA125, CT abdomen pelvis to evlauate for appendicitis, diverticulitis and ovarian pathology.  Close follow-up   Orders: -     CT ABDOMEN PELVIS W CONTRAST  Gastroesophageal reflux disease, unspecified whether esophagitis present Assessment & Plan: Suboptimal control.  Advised to resume Pepcid AC nightly   Orthostatic hypotension Assessment & Plan: Episode last week.  Reviewed EKG in the ED 05/14/2022 and previous to 04/30/2021 without significant changes.  Patient orthostatic on exam.  Pending ultrasound of carotid  arteries.  Close follow-up.  Consider Zio monitor if persists.    Other orders -     Fluticasone Propionate HFA; Inhale 2 puffs into the lungs 2 (two) times daily.  Dispense: 1 each; Refill: 12     Return precautions given.   Risks, benefits, and alternatives of the medications and treatment plan prescribed today were discussed, and patient expressed understanding.   Education regarding symptom management and diagnosis given to patient on AVS either electronically or printed.  Return in about 2 weeks (around 06/07/2022).  Rennie Plowman, FNP  Subjective:    Patient ID: Lynn Peters, female    DOB: [birth year], 67 y.o.   MRN: [id number]  CC: Lynn Peters is a 67 y.o. female who presents today for acute visit.   HPI: Complains of right lower quadrant pain, comes and goes. Symptom started a couple of months ago. More frequent/ Worse after meal. Improves with massage, passage of gas.   Dull ache.  Endorses abdominal distention.  H/o diverticulitis  She has been trying to wean of pepcid  and not sure if related to GERD.  She endorses breakthrough symptoms.  No hematuria, constipation, dyspareunia  One episode of 68 F and tactile warmth last week.  Cough and sob has improved.   She is compliant with flovent daily ( this is a prescription she gets from [place])  Partial hysterectomy;  has cervix and ovaries per patient; done for fibroids, noncancerous  She describes an episode last week after being seen in the hospital where she felt dizzy.  This was  self-limiting.  At the time she noticed some left-sided neck pain.  She has chronic posterior neck pain.  Occasional HA.  No vision loss, syncope.   Recently seen emergency room for/03/2022 and treated for bronchopneumonia with Augmentin, azithromycin, benzonatate. CTA negative for pulmonary embolism, indicated right-sided bronchopneumonia.  Trace pericardial fluid.  Given IV Rocephin in the emergency room and a dose of  Decadron.    Health Maintenance  Topic Date Due   DTaP/Tdap/Td vaccine (1 - Tdap) Never done   COVID-19 Vaccine (3 - Pfizer risk series) 05/29/2022*   Zoster (Shingles) Vaccine (1 of 2) 08/12/2022*   Flu Shot  09/12/2022   Medicare Annual Wellness Visit  05/13/2023   Mammogram  01/11/2024   Colon Cancer Screening  04/28/2026   Pneumonia Vaccine  Completed   DEXA scan (bone density measurement)  Completed   Hepatitis C Screening: USPSTF Recommendation to screen - Ages 43-79 yo.  Completed   HPV Vaccine  Aged Out  *Topic was postponed. The date shown is not the original due date.    ALLERGIES: Pantoprazole sodium, Tetracyclines & related, Breo ellipta [fluticasone furoate-vilanterol], and Flagyl [metronidazole]  Current Outpatient Medications on File Prior to Visit  Medication Sig Dispense Refill   albuterol (VENTOLIN HFA) 108 (90 Base) MCG/ACT inhaler INHALE 2 PUFFS INTO THE LUNGS EVERY 4 (FOUR) HOURS AS NEEDED FOR WHEEZING OR SHORTNESS OF BREATH. 18 g 2   Ascorbic Acid (VITAMIN C) 1000 MG tablet Take 1,000 mg by mouth daily.     Cetirizine HCl (ZYRTEC CHILDRENS ALLERGY PO) Take 5 mg by mouth at bedtime. liquid     Cholecalciferol (VITAMIN D3) 50 MCG (2000 UT) CAPS Take by mouth.     clobetasol (TEMOVATE) 0.05 % external solution Patient to pour solution into jar of CeraVe and mix well. Use twice daily to affected areas. Do not apply to face, groin or underarms. 50 mL 0   famotidine (PEPCID) 20 MG tablet Take 1 tablet (20 mg total) by mouth daily. 30 tablet 2   Probiotic Product (SOLUBLE FIBER/PROBIOTICS PO) Take 1 capsule by mouth daily.     TURMERIC PO Take by mouth. 1,000mg  once a day     vitamin B-12 (CYANOCOBALAMIN) 1000 MCG tablet Take 1,000 mcg by mouth daily.     azithromycin (ZITHROMAX) 200 MG/5ML suspension Take 12-1/2 mL the first day.  Take 6 mL by mouth on days 2 through 5. (Patient not taking: Reported on 05/24/2022) 40 mL 0   clobetasol cream (TEMOVATE) 0.05 % Apply 1  application. topically 2 (two) times daily. Not face prn (Patient not taking: Reported on 05/24/2022) 60 g 0   PRESCRIPTION MEDICATION Inhale 2 puffs into the lungs in the morning and at bedtime. Flixotide Inhaler (Patient not taking: Reported on 11/14/2021)     No current facility-administered medications on file prior to visit.    Review of Systems  Constitutional:  Negative for chills and fever.  Respiratory:  Negative for cough.   Cardiovascular:  Negative for chest pain and palpitations.  Gastrointestinal:  Negative for nausea and vomiting.  Neurological:  Positive for dizziness and headaches. Negative for syncope.      Objective:    BP 122/82   Pulse 81   Temp 97.7 F (36.5 C) (Oral)   Ht  (1.651 m)   Wt 145 lb (65.8 kg)   SpO2 99%   BMI 24.13 kg/m   BP Readings from Last 3 Encounters:  05/24/22 122/82  05/14/22 (!) 158/84  01/29/22  129/81   Wt Readings from Last 3 Encounters:  05/24/22 145 lb (65.8 kg)  05/14/22 144 lb (65.3 kg)  05/13/22 144 lb (65.3 kg)   Orthostatic VS for the past 24 hrs (Last 3 readings):  BP- Lying Pulse- Lying BP- Sitting Pulse- Sitting BP- Standing at 0 minutes Pulse- Standing at 0 minutes  05/24/22 1024 150/82 76 130/78 74 130/80 88    Physical Exam Vitals reviewed.  Constitutional:      Appearance: Normal appearance. She is well-developed.  Eyes:     Conjunctiva/sclera: Conjunctivae normal.  Neck:     Vascular: No carotid bruit.  Cardiovascular:     Rate and Rhythm: Normal rate and regular rhythm.     Pulses: Normal pulses.     Heart sounds: Normal heart sounds.  Pulmonary:     Effort: Pulmonary effort is normal.     Breath sounds: Normal breath sounds. No wheezing, rhonchi or rales.  Abdominal:     General: Bowel sounds are normal. There is no distension.     Palpations: Abdomen is soft. Abdomen is not rigid. There is no fluid wave or mass.     Tenderness: There is abdominal tenderness in the right lower quadrant. There  is no right CVA tenderness, left CVA tenderness, guarding or rebound.    Skin:    General: Skin is warm and dry.  Neurological:     Mental Status: She is alert.  Psychiatric:        Speech: Speech normal.        Behavior: Behavior normal.        Thought Content: Thought content normal.

## 2022-05-24 NOTE — Assessment & Plan Note (Addendum)
Episode last week.  Reviewed EKG in the ED 05/14/2022 and previous to 04/30/2021 without significant changes.  Patient orthostatic on exam.  Pending ultrasound of carotid arteries.  Close follow-up.  Consider Zio monitor if persists.

## 2022-05-24 NOTE — Patient Instructions (Addendum)
Please resume pepcid ac  at night.   You are orthostatic.  Please be very careful when going from sitting to standing.  Please ensure you are drinking plenty of water.  Please let me know how you are doing and certainly if dizziness symptoms persist.  I have ordered ultrasound of your carotids and echocardiogram to evaluate trace pericardial fluid  Let us know if you dont hear back within a week in regards to an appointment being scheduled.   So that you are aware, if you are [facility] MyChart user , please pay attention to your MyChart messages as you may receive a MyChart message with a phone number to call and schedule this test/appointment own your own from our referral coordinator. This is a new process so I do not want you to miss this message.  If you are not a MyChart user, you will receive a phone call.   Orthostatic Hypotension Blood pressure is a measurement of how strongly, or weakly, your circulating blood is pressing against the walls of your arteries. Orthostatic hypotension is a drop in blood pressure that can happen when you change positions, such as when you go from lying down to standing. Arteries are blood vessels that carry blood from your heart throughout your body. When blood pressure is too low, you may not get enough blood to your brain or to the rest of your organs. Orthostatic hypotension can cause light-headedness, sweating, rapid heartbeat, blurred vision, and fainting. These symptoms require further investigation into the cause. What are the causes? Orthostatic hypotension can be caused by many things, including: Sudden changes in posture, such as standing up quickly after you have been sitting or lying down. Loss of blood (anemia) or loss of body fluids (dehydration). Heart problems, neurologic problems, or hormone problems. Pregnancy. Aging. The risk for this condition increases as you get older. Severe infection (sepsis). Certain medicines, such as medicines for  high blood pressure or medicines that make the body lose excess fluids (diuretics). What are the signs or symptoms? Symptoms of this condition may include: Weakness, light-headedness, or dizziness. Sweating. Blurred vision. Tiredness (fatigue). Rapid heartbeat. Fainting, in severe cases. How is this diagnosed? This condition is diagnosed based on: Your symptoms and medical history. Your blood pressure measurements. Your health care provider will check your blood pressure when you are: Lying down. Sitting. Standing. A blood pressure reading is recorded as two numbers, such as "120 over 80" (or 120/80). The first ("top") number is called the systolic pressure. It is a measure of the pressure in your arteries as your heart beats. The second ("bottom") number is called the diastolic pressure. It is a measure of the pressure in your arteries when your heart relaxes between beats. Blood pressure is measured in a unit called mmHg. Healthy blood pressure for most adults is 120/80 mmHg. Orthostatic hypotension is defined as a 20 mmHg drop in systolic pressure or a 10 mmHg drop in diastolic pressure within 3 minutes of standing. Other information or tests that may be used to diagnose orthostatic hypotension include: Your other vital signs, such as your heart rate and temperature. Blood tests. An electrocardiogram (ECG) or echocardiogram. A Holter monitor. This is a device you wear that records your heart rhythm continuously, usually for 24-48 hours. Tilt table test. For this test, you will be safely secured to a table that moves you from a lying position to an upright position. Your heart rhythm and blood pressure will be monitored during the test. How is  this treated? This condition may be treated by: Changing your diet. This may involve eating more salt (sodium) or drinking more water. Changing the dosage of certain medicines you are taking that might be lowering your blood pressure. Correcting  the underlying reason for the orthostatic hypotension. Wearing compression stockings. Taking medicines to raise your blood pressure. Avoiding actions that trigger symptoms. Follow these instructions at home: Medicines Take over-the-counter and prescription medicines only as told by your health care provider. Follow instructions from your health care provider about changing the dosage of your current medicines, if this applies. Do not stop or adjust any of your medicines on your own. Eating and drinking  Drink enough fluid to keep your urine pale yellow. Eat extra salt only as directed. Do not add extra salt to your diet unless advised by your health care provider. Eat frequent, small meals. Avoid standing up suddenly after eating. General instructions  Get up slowly from lying down or sitting positions. This gives your blood pressure a chance to adjust. Avoid hot showers and excessive heat as directed by your health care provider. Engage in regular physical activity as directed by your health care provider. If you have compression stockings, wear them as told. Keep all follow-up visits. This is important. Contact a health care provider if: You have a fever for more than 2-3 days. You feel more thirsty than usual. You feel dizzy or weak. Get help right away if: You have chest pain. You have a fast or irregular heartbeat. You become sweaty or feel light-headed. You feel short of breath. You faint. You have any symptoms of a stroke. "BE FAST" is an easy way to remember the main warning signs of a stroke: B - Balance. Signs are dizziness, sudden trouble walking, or loss of balance. E - Eyes. Signs are trouble seeing or a sudden change in vision. F - Face. Signs are sudden weakness or numbness of the face, or the face or eyelid drooping on one side. A - Arms. Signs are weakness or numbness in an arm. This happens suddenly and usually on one side of the body. S - Speech. Signs are  sudden trouble speaking, slurred speech, or trouble understanding what people say. T - Time. Time to call emergency services. Write down what time symptoms started. You have other signs of a stroke, such as: A sudden, severe headache with no known cause. Nausea or vomiting. Seizure. These symptoms may represent a serious problem that is an emergency. Do not wait to see if the symptoms will go away. Get medical help right away. Call your local emergency services (911 in the [place] Do not drive yourself to the hospital. Summary Orthostatic hypotension is a sudden drop in blood pressure. It can cause light-headedness, sweating, rapid heartbeat, blurred vision, and fainting. Orthostatic hypotension can be diagnosed by having your blood pressure taken while lying down, sitting, and then standing. Treatment may involve changing your diet, wearing compression stockings, sitting up slowly, adjusting your medicines, or correcting the underlying reason for the orthostatic hypotension. Get help right away if you have chest pain, a fast or irregular heartbeat, or symptoms of a stroke. This information is not intended to replace advice given to you by your health care provider. Make sure you discuss any questions you have with your health care provider. Document Revised: 04/13/2020 Document Reviewed: 04/13/2020 Elsevier Patient Education  2023 [employer].

## 2022-05-24 NOTE — Assessment & Plan Note (Signed)
Symptomatically feeling better after Augmentin, azithromycin.  Chest x-ray due in 6 weeks time.  Pending echocardiogram due to trace pericardial fluid seen on CTA.

## 2022-05-24 NOTE — Assessment & Plan Note (Addendum)
Right-sided.  History of diverticulitis.  History of hysterectomy.  Pending labs including CA125, CT abdomen pelvis to evlauate for appendicitis, diverticulitis and ovarian pathology.  Close follow-up

## 2022-05-27 ENCOUNTER — Encounter: Payer: Self-pay | Admitting: Family

## 2022-05-27 LAB — CA 125: CA 125: 19 U/mL (ref <35)

## 2022-05-30 ENCOUNTER — Ambulatory Visit
Admission: RE | Admit: 2022-05-30 | Discharge: 2022-05-30 | Disposition: A | Discharge status: Home / Self Care | Payer: Medicare Other | Source: Ambulatory Visit | Attending: Family | Admitting: Family

## 2022-05-30 DIAGNOSIS — R42 Dizziness and giddiness: Secondary | ICD-10-CM | POA: Insufficient documentation

## 2022-05-30 DIAGNOSIS — R1031 Right lower quadrant pain: Secondary | ICD-10-CM

## 2022-06-06 ENCOUNTER — Telehealth: Payer: Self-pay | Admitting: Family

## 2022-06-06 ENCOUNTER — Telehealth: Payer: Self-pay

## 2022-06-06 ENCOUNTER — Ambulatory Visit (INDEPENDENT_AMBULATORY_CARE_PROVIDER_SITE_OTHER): Payer: Medicare Other | Admitting: Family

## 2022-06-06 ENCOUNTER — Encounter: Payer: Self-pay | Admitting: Family

## 2022-06-06 VITALS — BP 138/78 | HR 70 | Temp 97.8°F | Ht 65.5 in | Wt 144.2 lb

## 2022-06-06 DIAGNOSIS — I700 Atherosclerosis of aorta: Secondary | ICD-10-CM | POA: Diagnosis not present

## 2022-06-06 DIAGNOSIS — I951 Orthostatic hypotension: Secondary | ICD-10-CM | POA: Diagnosis not present

## 2022-06-06 DIAGNOSIS — R1031 Right lower quadrant pain: Secondary | ICD-10-CM

## 2022-06-06 LAB — URINALYSIS, ROUTINE W REFLEX MICROSCOPIC
Bilirubin Urine: NEGATIVE
Hgb urine dipstick: NEGATIVE
Ketones, ur: NEGATIVE
Leukocytes,Ua: NEGATIVE
Nitrite: NEGATIVE
RBC / HPF: NONE SEEN (ref 0–?)
Specific Gravity, Urine: <=1.005 — AB (ref 1.000–1.030)
Total Protein, Urine: NEGATIVE
Urine Glucose: NEGATIVE
Urobilinogen, UA: 0.2 (ref 0.0–1.0)
WBC, UA: NONE SEEN (ref 0–?)
pH: 5.5 (ref 5.0–8.0)

## 2022-06-06 NOTE — Telephone Encounter (Signed)
Patient returned referrals phone call. 

## 2022-06-06 NOTE — Telephone Encounter (Signed)
At check-out today for patient's appointment, check-out instructions included scheduling an x-ray for patient when Thurmond Butts, CMA, will be available.  We do not currently know Lynn Peters's availability for x-ray.  Patient may need to be scheduled at the hospital.  No appointment has been scheduled for x-ray.

## 2022-06-06 NOTE — Progress Notes (Signed)
Assessment & Plan:  Orthostatic hypotension Assessment & Plan: Pending MRI of the brain due to family history of TIA and prior episode of dizziness.  Fortunately episode has not recurred  Orders: -     MR BRAIN W WO CONTRAST; Future  Atherosclerosis of aorta [facility]) Assessment & Plan: She is not particularly interested in statin therapy at this time. LDL with significant improvement 154 to 92.  Discussed alternative measures to starting statin therapy; I was able to find preliminary articles reflecting antithrombotic properties of nattokinase.  Advised patient did not have experience with this supplement and advised second opinion in regard to further restratification with cardiology.  I placed referral today.    Orders: -     Ambulatory referral to Gastroenterology -     Ambulatory referral to Cardiology  Right lower quadrant abdominal pain Assessment & Plan: Reassuring low back and hip exam today.  Discussed if arthritic changes incidentally seen on CT abdomen/pelvis could be playing a role. interestingly, symptoms seems to improve after bowel movement. GERD appears to be playing a role as well.  Advised patient to start Pepcid AC 20 mg  qpm and she may increase to pepcid 20mg  BID.  Pending lumbar x-ray, bilateral hip x-ray.  Referral to gastroenterology for second opinion in regard to etiology of pain.   Orders: -     DG HIPS BILAT W OR W/O PELVIS 3-4 VIEWS; Future -     DG Lumbar Spine Complete; Future -     Urinalysis, Routine w reflex microscopic     Return precautions given.   Risks, benefits, and alternatives of the medications and treatment plan prescribed today were discussed, and patient expressed understanding.   Education regarding symptom management and diagnosis given to patient on AVS either electronically or printed.  Return in about 2 months (around 08/06/2022).  Rennie Plowman, FNP  Subjective:    Patient ID: Lynn Peters, female    DOB: 1956/01/12, 67 y.o.    MRN: [id number]  CC: Lynn Peters is a 67 y.o. female who presents today for follow up.   HPI: She is concerned regarding athersclerosis. She is interested in nattokinase.    She continues to have RLQ pain, episodic.  Worse after tomato sauce, orange. Worse with ASA.She experiences some relief with BM.  She takes pepcid 10mg  qpm.   She describes normal formed and daily BM.  Pysllium causes constipation.   No recurrence of dizziness. Her mother had TIA which is of concern for her.    Allergies: Pantoprazole sodium, Tetracyclines & related, Breo ellipta [fluticasone furoate-vilanterol], and Flagyl [metronidazole] Current Outpatient Medications on File Prior to Visit  Medication Sig Dispense Refill   albuterol (VENTOLIN HFA) 108 (90 Base) MCG/ACT inhaler INHALE 2 PUFFS INTO THE LUNGS EVERY 4 (FOUR) HOURS AS NEEDED FOR WHEEZING OR SHORTNESS OF BREATH. 18 g 2   Ascorbic Acid (VITAMIN C) 1000 MG tablet Take 1,000 mg by mouth daily.     Cetirizine HCl (ZYRTEC CHILDRENS ALLERGY PO) Take 5 mg by mouth at bedtime. liquid     Cholecalciferol (VITAMIN D3) 50 MCG (2000 UT) CAPS Take by mouth.     clobetasol (TEMOVATE) 0.05 % external solution Patient to pour solution into jar of CeraVe and mix well. Use twice daily to affected areas. Do not apply to face, groin or underarms. 50 mL 0   famotidine (PEPCID) 20 MG tablet Take 1 tablet (20 mg total) by mouth daily. 30 tablet 2   fluticasone (FLOVENT HFA) 44  MCG/ACT inhaler Inhale 2 puffs into the lungs 2 (two) times daily. 1 each 12   Probiotic Product (SOLUBLE FIBER/PROBIOTICS PO) Take 1 capsule by mouth daily.     TURMERIC PO Take by mouth. 1,000mg  once a day     vitamin B-12 (CYANOCOBALAMIN) 1000 MCG tablet Take 1,000 mcg by mouth daily.     No current facility-administered medications on file prior to visit.    Review of Systems  Constitutional:  Negative for chills and fever.  Respiratory:  Negative for cough.   Cardiovascular:  Negative for  chest pain and palpitations.  Gastrointestinal:  Positive for abdominal pain. Negative for abdominal distention, constipation, nausea and vomiting.      Objective:    BP 138/78   Pulse 70   Temp 97.8 F (36.6 C) (Oral)   Ht 5' 5.5" (1.664 m)   Wt 144 lb 3.2 oz (65.4 kg)   SpO2 98%   BMI 23.63 kg/m  BP Readings from Last 3 Encounters:  06/06/22 138/78  05/24/22 122/82  05/14/22 (!) 158/84   Wt Readings from Last 3 Encounters:  06/06/22 144 lb 3.2 oz (65.4 kg)  05/24/22 145 lb (65.8 kg)  05/14/22 144 lb (65.3 kg)    Physical Exam Vitals reviewed.  Constitutional:      Appearance: She is well-developed.  Eyes:     Conjunctiva/sclera: Conjunctivae normal.  Cardiovascular:     Rate and Rhythm: Normal rate and regular rhythm.     Pulses: Normal pulses.     Heart sounds: Normal heart sounds.  Pulmonary:     Effort: Pulmonary effort is normal.     Breath sounds: Normal breath sounds. No wheezing, rhonchi or rales.  Musculoskeletal:     Lumbar back: No swelling, edema, spasms, tenderness or bony tenderness. Normal range of motion.     Comments: Full range of motion with flexion, tension, lateral side bends. No bony tenderness. No pain, numbness, tingling elicited with single leg raise bilaterally.  No limp or waddling gait  Right and left Hip: . Full ROM with flexion and hip rotation in flexion.    No pain of lateral hip with  (flexion-abduction-external rotation) test bilaterally.   No pain with deep palpation of greater trochanter bilaterally.      Skin:    General: Skin is warm and dry.  Neurological:     Mental Status: She is alert.     Sensory: No sensory deficit.     Deep Tendon Reflexes:     Reflex Scores:      Patellar reflexes are 2+ on the right side and 2+ on the left side.    Comments: Sensation and strength intact bilateral lower extremities.  Psychiatric:        Speech: Speech normal.        Behavior: Behavior normal.        Thought Content:  Thought content normal.

## 2022-06-06 NOTE — Patient Instructions (Addendum)
Increase pepcid ac to 20mg  twice daily.   Resume probiotics.  Referral to cardiology, gastroenterology and MRI brain  Let us know if you dont hear back within a week in regards to an appointment being scheduled.   So that you are aware, if you are [facility] MyChart user , please pay attention to your MyChart messages as you may receive a MyChart message with a phone number to call and schedule this test/appointment own your own from our referral coordinator. This is a new process so I do not want you to miss this message.  If you are not a MyChart user, you will receive a phone call.

## 2022-06-06 NOTE — Telephone Encounter (Signed)
Lft pt vm to call ofc to sch MRI. thanks 

## 2022-06-10 NOTE — Assessment & Plan Note (Addendum)
She is not particularly interested in statin therapy at this time. LDL with significant improvement 154 to 92.  Discussed alternative measures to starting statin therapy; I was able to find preliminary articles reflecting antithrombotic properties of nattokinase.  Advised patient did not have experience with this supplement and advised second opinion in regard to further restratification with cardiology.  I placed referral today.

## 2022-06-10 NOTE — Telephone Encounter (Signed)
Spoke to pt in office and discussed Xray

## 2022-06-10 NOTE — Assessment & Plan Note (Addendum)
Reassuring low back and hip exam today.  Discussed if arthritic changes incidentally seen on CT abdomen/pelvis could be playing a role. interestingly, symptoms seems to improve after bowel movement. GERD appears to be playing a role as well.  Advised patient to start Pepcid AC 20 mg  qpm and she may increase to pepcid 20mg  BID.  Pending lumbar x-ray, bilateral hip x-ray.  Referral to gastroenterology for second opinion in regard to etiology of pain.

## 2022-06-10 NOTE — Assessment & Plan Note (Signed)
Pending MRI of the brain due to family history of TIA and prior episode of dizziness.  Fortunately episode has not recurred

## 2022-06-11 ENCOUNTER — Telehealth: Payer: Self-pay | Admitting: Cardiology

## 2022-06-11 ENCOUNTER — Encounter: Payer: Self-pay | Admitting: Cardiology

## 2022-06-11 ENCOUNTER — Encounter: Payer: Self-pay | Admitting: Family

## 2022-06-11 ENCOUNTER — Other Ambulatory Visit: Payer: Self-pay

## 2022-06-11 DIAGNOSIS — J4530 Mild persistent asthma, uncomplicated: Secondary | ICD-10-CM

## 2022-06-11 MED ORDER — ALBUTEROL SULFATE HFA 108 (90 BASE) MCG/ACT IN AERS
INHALATION_SPRAY | RESPIRATORY_TRACT | 2 refills | Status: DC
Start: 2022-06-11 — End: 2023-01-13

## 2022-06-11 NOTE — Telephone Encounter (Signed)
Patient is requesting to switch from Dr. Azucena Cecil to Dr. Kirke Corin. She states she would like to see the same doctor as her husband.

## 2022-06-12 NOTE — Telephone Encounter (Signed)
Fine with me

## 2022-06-13 ENCOUNTER — Ambulatory Visit
Admission: RE | Admit: 2022-06-13 | Discharge: 2022-06-13 | Disposition: A | Discharge status: Home / Self Care | Payer: Medicare Other | Source: Ambulatory Visit | Attending: Family | Admitting: Family

## 2022-06-13 DIAGNOSIS — I951 Orthostatic hypotension: Secondary | ICD-10-CM

## 2022-06-13 MED ORDER — GADOBUTROL 1 MMOL/ML IV SOLN
6.0000 mL | Freq: Once | INTRAVENOUS | Status: AC | PRN
  Administered 2022-06-13: 6 mL via INTRAVENOUS

## 2022-06-13 NOTE — Telephone Encounter (Signed)
Error

## 2022-06-21 ENCOUNTER — Ambulatory Visit
Admission: RE | Admit: 2022-06-21 | Discharge: 2022-06-21 | Disposition: A | Discharge status: Home / Self Care | Payer: Medicare Other | Source: Ambulatory Visit | Attending: Family | Admitting: Family

## 2022-06-21 DIAGNOSIS — R42 Dizziness and giddiness: Secondary | ICD-10-CM

## 2022-06-21 DIAGNOSIS — F419 Anxiety disorder, unspecified: Secondary | ICD-10-CM | POA: Diagnosis not present

## 2022-06-21 DIAGNOSIS — I3139 Other pericardial effusion (noninflammatory): Secondary | ICD-10-CM | POA: Diagnosis present

## 2022-06-21 DIAGNOSIS — K219 Gastro-esophageal reflux disease without esophagitis: Secondary | ICD-10-CM | POA: Insufficient documentation

## 2022-06-21 DIAGNOSIS — I081 Rheumatic disorders of both mitral and tricuspid valves: Secondary | ICD-10-CM | POA: Insufficient documentation

## 2022-06-21 LAB — ECHOCARDIOGRAM COMPLETE
AR max vel: 2.33 cm2
AV Area VTI: 2.37 cm2
AV Area mean vel: 2.13 cm2
AV Mean grad: 3 mmHg
AV Peak grad: 6.3 mmHg
Ao pk vel: 1.25 m/s
Area-P 1/2: 3.77 cm2
MV VTI: 2.71 cm2
S' Lateral: 2.7 cm

## 2022-06-21 NOTE — Progress Notes (Signed)
*  PRELIMINARY RESULTS* Echocardiogram 2D Echocardiogram has been performed.  Cristela Blue 06/21/2022, 10:57 AM

## 2022-06-24 ENCOUNTER — Encounter: Payer: Self-pay | Admitting: Physician Assistant

## 2022-06-24 ENCOUNTER — Ambulatory Visit (INDEPENDENT_AMBULATORY_CARE_PROVIDER_SITE_OTHER): Payer: Medicare Other | Admitting: Physician Assistant

## 2022-06-24 VITALS — BP 106/70 | HR 67 | Temp 97.7°F | Ht 65.5 in | Wt 145.0 lb

## 2022-06-24 DIAGNOSIS — K219 Gastro-esophageal reflux disease without esophagitis: Secondary | ICD-10-CM | POA: Diagnosis not present

## 2022-06-24 DIAGNOSIS — K589 Irritable bowel syndrome without diarrhea: Secondary | ICD-10-CM | POA: Diagnosis not present

## 2022-06-24 NOTE — Progress Notes (Signed)
Celso Amy, PA-C [address]  [address]  [place], [place] [address]  Main: [phone]  Fax: [phone]   Gastroenterology Consultation  Referring Provider:     Allegra Grana, FNP Primary Care Physician:  Allegra Grana, FNP Primary Gastroenterologist:  Dr. Lannette Donath  Reason for Consultation:     RLQ Abdominal Pain, GERD        HPI:   Lynn Peters is a 67 y.o. y/o female referred for consultation & management  by Allegra Grana, FNP.    CT abdomen pelvis with contrast done 05/24/22, to evaluate RLQ pain, showed no acute abnormality.  Normal appendix.  Incidental diverticulosis with no diverticulitis.  Recent pelvic ultrasound was also normal.  Screening colonoscopy done 04/2019 by Dr. Allegra Lai showed 1 diminutive tubular adenoma polyp removed from descending colon.  Sigmoid diverticulosis.  Colon biopsies negative for microscopic colitis.  EGD done 04/2019 was normal.  Biopsies were negative for celiac.  She has had intermittent right lower quadrant abdominal pain for several weeks.  She always has loose stools.  She denies diarrhea, constipation, rectal bleeding, or unintentional weight loss.  She eats a lot of vegetables.  She recently started taking OTC probiotic daily which has helped.  Her RLQ pain has greatly improved.  She has history of acid reflux.  Is currently taking OTC famotidine 20mg  once daily.  She refuses to take PPIs due to concerns about adverse side effects.  She admits to occasional breakthrough GERD on famotidine.  Past Medical History:  Diagnosis Date   Allergy    Anxiety    Arthritis    Asthma    Chest pain    Chicken pox    Complication of anesthesia    Difficulty breathing after GYN procedure   Diverticulitis 2009   GERD (gastroesophageal reflux disease)    Pneumonia     Past Surgical History:  Procedure Laterality Date   ABDOMINAL HYSTERECTOMY  2004   partial; has cervix and ovaries per patient; done for fibroids,  noncancerous   ANTERIOR CRUCIATE LIGAMENT REPAIR Right 2000   COLONOSCOPY WITH PROPOFOL N/A 04/28/2019   Procedure: COLONOSCOPY WITH PROPOFOL;  Surgeon: Toney Reil, MD;  Location: [facility] ENDOSCOPY;  Service: Gastroenterology;  Laterality: N/A;   ESOPHAGOGASTRODUODENOSCOPY (EGD) WITH PROPOFOL N/A 04/28/2019   Procedure: ESOPHAGOGASTRODUODENOSCOPY (EGD) WITH PROPOFOL;  Surgeon: Toney Reil, MD;  Location: [facility] ENDOSCOPY;  Service: Gastroenterology;  Laterality: N/A;   LAPAROSCOPY     TOTAL KNEE ARTHROPLASTY Right 06/02/2020   Procedure: RIGHT TOTAL KNEE ARTHROPLASTY;  Surgeon: Kathryne Hitch, MD;  Location: [facility] ORS;  Service: Orthopedics;  Laterality: Right;  Needs RNFA   UTERINE FIBROID SURGERY      Prior to Admission medications   Medication Sig Start Date End Date Taking? Authorizing Provider  albuterol (VENTOLIN HFA) 108 (90 Base) MCG/ACT inhaler INHALE 2 PUFFS INTO THE LUNGS EVERY 4 (FOUR) HOURS AS NEEDED FOR WHEEZING OR SHORTNESS OF BREATH. 06/11/22  Yes Arnett, Lyn Records, FNP  Ascorbic Acid (VITAMIN C) 1000 MG tablet Take 1,000 mg by mouth daily.   Yes [provider]  Cetirizine HCl (ZYRTEC CHILDRENS ALLERGY PO) Take 5 mg by mouth at bedtime. liquid   Yes [provider]  Cholecalciferol (VITAMIN D3) 50 MCG (2000 UT) CAPS Take by mouth.   Yes [provider]  clobetasol (TEMOVATE) 0.05 % external solution Patient to pour solution into jar of CeraVe and mix well. Use twice daily to affected areas. Do not  apply to face, groin or underarms. 06/06/21  Yes Willeen Niece, MD  famotidine (PEPCID) 20 MG tablet Take 1 tablet (20 mg total) by mouth daily. 02/14/17  Yes Sherlene Shams, MD  fluticasone (FLOVENT HFA) 44 MCG/ACT inhaler Inhale 2 puffs into the lungs 2 (two) times daily. 05/24/22  Yes Allegra Grana, FNP  Probiotic Product (SOLUBLE FIBER/PROBIOTICS PO) Take 1 capsule by mouth daily.   Yes [provider]  TURMERIC PO Take by  mouth. 1,000mg  once a day   Yes [provider]  vitamin B-12 (CYANOCOBALAMIN) 1000 MCG tablet Take 1,000 mcg by mouth daily.   Yes [provider]    Family History  Problem Relation Age of Onset   Alzheimer's disease Mother    Asthma Mother    Diabetes Mother    Hyperlipidemia Mother    Hypertension Mother    Stroke Mother 73   Heart disease Father        CHF   Diabetes Father    Hyperlipidemia Father    Asthma Sister    Diabetes Sister    Alcohol abuse Brother    Heart disease Brother    Breast cancer Neg Hx      Social History   Tobacco Use   Smoking status: Never    Passive exposure: Current   Smokeless tobacco: Never  Vaping Use   Vaping Use: Never used  Substance Use Topics   Alcohol use: Yes    Alcohol/week: 0.0 standard drinks of alcohol    Comment: rare   Drug use: No    Allergies as of 06/24/2022 - Review Complete 06/24/2022  Allergen Reaction Noted   Pantoprazole sodium Shortness Of Breath 08/29/2017   Tetracyclines & related  08/05/2016   Breo ellipta [fluticasone furoate-vilanterol] Palpitations 03/20/2016   Flagyl [metronidazole] Other (See Comments) 01/22/2016    Review of Systems:    All systems reviewed and negative except where noted in HPI.   Physical Exam:  BP 106/70   Pulse 67   Temp 97.7 F (36.5 C)   Ht 5' 5.5" (1.664 m)   Wt 145 lb (65.8 kg)   BMI 23.76 kg/m  No LMP recorded. Patient has had a hysterectomy. Psych:  Alert and cooperative. Normal mood and affect. General:   Alert,  Well-developed, well-nourished, pleasant and cooperative in NAD Head:  Normocephalic and atraumatic. Eyes:  Sclera clear, no icterus.   Conjunctiva pink. Ears:  Normal auditory acuity. Neck:  Supple; no masses or thyromegaly. Lungs:  Respirations even and unlabored.  Clear throughout to auscultation.   No wheezes, crackles, or rhonchi. No acute distress. Heart:  Regular rate and rhythm; no murmurs, clicks, rubs, or gallops. Abdomen:   Normal bowel sounds.  No bruits.  Soft, non-tender and non-distended without masses, hepatosplenomegaly or hernias noted.  No guarding or rebound tenderness.    Neurologic:  Alert and oriented x3;  grossly normal neurologically. Psych:  Alert and cooperative. Normal mood and affect.    Assessment and Plan:   Lynn Peters is a 67 y.o. y/o female has been referred for RLQ pain and GERD.  Recent abdominal pelvic CT and pelvic ultrasound were normal.  She started probiotic and her RLQ pain has greatly improved.  GERD is not controlled on famotidine 20 Mg once daily.  RLQ abdominal Pain - Suspect IBS / Colon Spasm  Reassurance Regarding recent normal CT and pelvic ultrasound.  I offered prescription for dicyclomine 10 Mg 1 tablet 3 times daily as needed and  patient declined.  She will let me know if she wants to try dicyclomine in the future.  Recommend OTC align probiotic daily.  2.   GERD  Increase famotidine to 20 mg twice daily.  Continue acid reflux diet, avoid food and drink triggers.  Normal EGD 04/2019.  3.   Hx Colon Polyp  Last colonoscopy 04/2019 showed 1 diminutive polyp removed.  Repeat in 7 years (04/2026).  Follow up as needed if GI symptoms worsen or return.  Celso Amy, PA-C

## 2022-07-14 ENCOUNTER — Encounter: Payer: Self-pay | Admitting: Family

## 2022-07-15 NOTE — Telephone Encounter (Signed)
LVM to call back to office  

## 2022-07-16 ENCOUNTER — Other Ambulatory Visit: Payer: Self-pay | Admitting: Family

## 2022-07-29 NOTE — Telephone Encounter (Signed)
Not sure how to note this because pharmacy is in [place]?

## 2022-07-30 NOTE — Telephone Encounter (Signed)
Spoke to pt and she only wants her Inhalers to go to [facility] rx and all other medications to go to [facility]

## 2022-08-07 ENCOUNTER — Ambulatory Visit: Payer: Medicare Other | Admitting: Family

## 2022-08-23 ENCOUNTER — Ambulatory Visit: Payer: Medicare Other | Admitting: Cardiovascular Disease

## 2022-08-27 ENCOUNTER — Ambulatory Visit: Payer: Medicare Other | Attending: Cardiovascular Disease | Admitting: Cardiovascular Disease

## 2022-08-27 ENCOUNTER — Encounter: Payer: Self-pay | Admitting: Cardiovascular Disease

## 2022-08-27 VITALS — BP 110/60 | HR 70 | Ht 65.5 in | Wt 147.0 lb

## 2022-08-27 DIAGNOSIS — E785 Hyperlipidemia, unspecified: Secondary | ICD-10-CM | POA: Insufficient documentation

## 2022-08-27 DIAGNOSIS — R002 Palpitations: Secondary | ICD-10-CM | POA: Insufficient documentation

## 2022-08-27 NOTE — Patient Instructions (Signed)
Medication Instructions:  No changes *If you need a refill on your cardiac medications before your next appointment, please call your pharmacy*   Lab Work: None ordered If you have labs (blood work) drawn today and your tests are completely normal, you will receive your results only by: MyChart Message (if you have MyChart) OR A paper copy in the mail If you have any lab test that is abnormal or we need to change your treatment, we will call you to review the results.   Testing/Procedures: None ordered   Follow-Up: At [facility], you and your health needs are our priority.  As part of our continuing mission to provide you with exceptional heart care, we have created designated Provider Care Teams.  These Care Teams include your primary Cardiologist (physician) and Advanced Practice Providers (APPs -  Physician Assistants and Nurse Practitioners) who all work together to provide you with the care you need, when you need it.  We recommend signing up for the patient portal called "MyChart".  Sign up information is provided on this After Visit Summary.  MyChart is used to connect with patients for Virtual Visits (Telemedicine).  Patients are able to view lab/test results, encounter notes, upcoming appointments, etc.  Non-urgent messages can be sent to your provider as well.   To learn more about what you can do with MyChart, go to [email or web address].    Your next appointment:   12 month(s)  Provider:   You may see Dr. Arida or one of the following Advanced Practice Providers on your designated Care Team:   Christopher Berge, NP Ryan Dunn, PA-C Cadence Furth, PA-C Sheri Hammock, NP    

## 2022-08-27 NOTE — Progress Notes (Signed)
Cardiology Office Note   Date:  08/27/2022   ID:  Lynn Peters, DOB 1955-04-18, MRN [id number]  PCP:  Allegra Grana, FNP  Cardiologist:   Lorine Bears, MD   Chief Complaint  Patient presents with   Follow-up    Arthrosclerosis of the aorta pt would like to discuss Statin drugs. Meds reviewed verbally with pt.      History of Present Illness: Lynn Peters is a 67 y.o. female who presents to establish cardiovascular care.  She saw Dr. Azucena Cecil last year for palpitations and was found to have short SVTs and rare premature beats.  Symptoms were not severe enough to require medication. She is concerned about her cardiovascular risk.  She had carotid Doppler done in April of this year which showed minimal eccentric plaque involving the right coronary artery.Echocardiogram in May of this year showed normal LV systolic function with no significant valvular abnormalities.  Previous CT imaging did show mild aortic atherosclerosis.  She has been doing well with no recent chest pain, shortness of breath or palpitations.  She has been taking a supplement called Nattokinase and had some questions about this.    Past Medical History:  Diagnosis Date   Allergy    Anxiety    Arthritis    Asthma    Chest pain    Chicken pox    Complication of anesthesia    Difficulty breathing after GYN procedure   Diverticulitis 2009   GERD (gastroesophageal reflux disease)    Pneumonia     Past Surgical History:  Procedure Laterality Date   ABDOMINAL HYSTERECTOMY  2004   partial; has cervix and ovaries per patient; done for fibroids, noncancerous   ANTERIOR CRUCIATE LIGAMENT REPAIR Right 2000   COLONOSCOPY WITH PROPOFOL N/A 04/28/2019   Procedure: COLONOSCOPY WITH PROPOFOL;  Surgeon: Toney Reil, MD;  Location: [facility] ENDOSCOPY;  Service: Gastroenterology;  Laterality: N/A;   ESOPHAGOGASTRODUODENOSCOPY (EGD) WITH PROPOFOL N/A 04/28/2019   Procedure: ESOPHAGOGASTRODUODENOSCOPY (EGD) WITH  PROPOFOL;  Surgeon: Toney Reil, MD;  Location[facility] ENDOSCOPY;  Service: Gastroenterology;  Laterality: N/A;   LAPAROSCOPY     TOTAL KNEE ARTHROPLASTY Right 06/02/2020   Procedure: RIGHT TOTAL KNEE ARTHROPLASTY;  Surgeon: Kathryne Hitch, MD;  Location: [facility] ORS;  Service: Orthopedics;  Laterality: Right;  Needs RNFA   UTERINE FIBROID SURGERY       Current Outpatient Medications  Medication Sig Dispense Refill   albuterol (VENTOLIN HFA) 108 (90 Base) MCG/ACT inhaler INHALE 2 PUFFS INTO THE LUNGS EVERY 4 (FOUR) HOURS AS NEEDED FOR WHEEZING OR SHORTNESS OF BREATH. 18 g 2   Ascorbic Acid (VITAMIN C) 1000 MG tablet Take 1,000 mg by mouth daily.     Cetirizine HCl (ZYRTEC CHILDRENS ALLERGY PO) Take 5 mg by mouth at bedtime. liquid     Cholecalciferol (VITAMIN D3) 50 MCG (2000 UT) CAPS Take by mouth.     clobetasol (TEMOVATE) 0.05 % external solution Patient to pour solution into jar of CeraVe and mix well. Use twice daily to affected areas. Do not apply to face, groin or underarms. 50 mL 0   famotidine (PEPCID) 20 MG tablet Take 1 tablet (20 mg total) by mouth daily. 30 tablet 2   fluticasone (FLOVENT HFA) 44 MCG/ACT inhaler Inhale 2 puffs into the lungs 2 (two) times daily. 1 each 12   Probiotic Product (SOLUBLE FIBER/PROBIOTICS PO) Take 1 capsule by mouth daily.     TURMERIC PO Take by mouth. 1,000mg  once a day  vitamin B-12 (CYANOCOBALAMIN) 1000 MCG tablet Take 1,000 mcg by mouth daily.     No current facility-administered medications for this visit.    Allergies:   Pantoprazole sodium, Tetracyclines & related, Breo ellipta [fluticasone furoate-vilanterol], and Flagyl [metronidazole]    Social History:  The patient  reports that she has never smoked. She has been exposed to tobacco smoke. She has never used smokeless tobacco. She reports current alcohol use. She reports that she does not use drugs.   Family History:  The patient's family history includes Alcohol abuse in  her brother; Alzheimer's disease in her mother; Asthma in her mother and sister; Diabetes in her father, mother, and sister; Heart disease in her brother and father; Hyperlipidemia in her father and mother; Hypertension in her mother; Stroke (age of onset: 108) in her mother.    ROS:  Please see the history of present illness.   Otherwise, review of systems are positive for none.   All other systems are reviewed and negative.    PHYSICAL EXAM: VS:  BP 110/60 (BP Location: Left Arm, Patient Position: Sitting, Cuff Size: Normal)   Pulse 70   Ht 5' 5.5" (1.664 m)   Wt 147 lb (66.7 kg)   SpO2 99%   BMI 24.09 kg/m  , BMI Body mass index is 24.09 kg/m. GEN: Well nourished, well developed, in no acute distress  HEENT: normal  Neck: no JVD, carotid bruits, or masses Cardiac: RRR; no murmurs, rubs, or gallops,no edema  Respiratory:  clear to auscultation bilaterally, normal work of breathing GI: soft, nontender, nondistended, + BS MS: no deformity or atrophy  Skin: warm and dry, no rash Neuro:  Strength and sensation are intact Psych: euthymic mood, full affect   EKG:  EKG is not ordered today.    Recent Labs: 05/24/2022: ALT 29; BUN 10; Creatinine, Ser 0.91; Hemoglobin 14.9; Platelets 313.0; Potassium 3.9; Sodium 140    Lipid Panel    Component Value Date/Time   CHOL 177 05/24/2022 1022   CHOL 185 03/19/2021 0859   TRIG 128.0 05/24/2022 1022   HDL 60.10 05/24/2022 1022   HDL 56 03/19/2021 0859   CHOLHDL 3 05/24/2022 1022   VLDL 25.6 05/24/2022 1022   LDLCALC 92 05/24/2022 1022   LDLCALC 113 (H) 03/19/2021 0859      Wt Readings from Last 3 Encounters:  08/27/22 147 lb (66.7 kg)  06/24/22 145 lb (65.8 kg)  06/06/22 144 lb 3.2 oz (65.4 kg)           No data to display            ASSESSMENT AND PLAN:  1.  Palpitations: Symptoms are resolved since last year with no evidence of significant arrhythmia.  2.  Hyperlipidemia: I reviewed the results of most recent  lipid profile which was done in April 2024.  Her LDL improved significantly to 92 with healthy lifestyle changes.  Her 10-year cardiovascular risk is 4.8% which is below baseline and not significant enough to justify treatment with a statin at this time.  Her previous imaging showed only minimal plaque buildup. Prolonged discussion with the patient about healthy lifestyle changes. Based on the reading about Nattokinase, it appears that that supplement has effects on blood thinning properties and likely no effect on atherosclerosis.    Disposition:   FU with me in 1 year  Signed,  Lorine Bears, MD  08/27/2022 2:01 PM    [facility]

## 2022-09-26 ENCOUNTER — Encounter (INDEPENDENT_AMBULATORY_CARE_PROVIDER_SITE_OTHER): Payer: Self-pay

## 2022-10-23 ENCOUNTER — Ambulatory Visit (INDEPENDENT_AMBULATORY_CARE_PROVIDER_SITE_OTHER): Payer: Medicare Other | Admitting: Family Medicine

## 2022-10-23 ENCOUNTER — Encounter: Payer: Self-pay | Admitting: Family Medicine

## 2022-10-23 VITALS — BP 112/62 | HR 81 | Temp 97.5°F | Ht 65.5 in | Wt 144.0 lb

## 2022-10-23 DIAGNOSIS — R197 Diarrhea, unspecified: Secondary | ICD-10-CM

## 2022-10-23 NOTE — Assessment & Plan Note (Signed)
Recent onset issue.  Discussed concern for infectious cause.  She will be given a stool collection kit and she will take her stool sample to the lab at the hospital.  Orders placed as outlined.  Discussed treatment options would depend on stool testing results.  Advised against use of Imodium or other antispasmodics until we have testing results.

## 2022-10-23 NOTE — Progress Notes (Signed)
Marikay Alar, MD Phone: [phone]  Lynn Peters is a 67 y.o. female who presents today for same-day visit.  Diarrhea: Patient notes for the last 10 days she has had issues with her stomach.  She has bloating and cramping and loose very smelly stools.  She is felt fatigued.  Some days she will not have a bowel movement though other days she will have multiple.  She notes some nausea.  No vomiting.  No blood in her stool.  She has been eating more soup with cooked vegetables.  She has traveled to surrounding states though no other travel.  Has not been drinking out of wells or rivers.  No sick contacts.  Social History   Tobacco Use  Smoking Status Never   Passive exposure: Current  Smokeless Tobacco Never    Current Outpatient Medications on File Prior to Visit  Medication Sig Dispense Refill   albuterol (VENTOLIN HFA) 108 (90 Base) MCG/ACT inhaler INHALE 2 PUFFS INTO THE LUNGS EVERY 4 (FOUR) HOURS AS NEEDED FOR WHEEZING OR SHORTNESS OF BREATH. 18 g 2   Ascorbic Acid (VITAMIN C) 1000 MG tablet Take 1,000 mg by mouth daily.     Cetirizine HCl (ZYRTEC CHILDRENS ALLERGY PO) Take 5 mg by mouth at bedtime. liquid     Cholecalciferol (VITAMIN D3) 50 MCG (2000 UT) CAPS Take by mouth.     clobetasol (TEMOVATE) 0.05 % external solution Patient to pour solution into jar of CeraVe and mix well. Use twice daily to affected areas. Do not apply to face, groin or underarms. 50 mL 0   famotidine (PEPCID) 20 MG tablet Take 1 tablet (20 mg total) by mouth daily. 30 tablet 2   fluticasone (FLOVENT HFA) 44 MCG/ACT inhaler Inhale 2 puffs into the lungs 2 (two) times daily. 1 each 12   Probiotic Product (SOLUBLE FIBER/PROBIOTICS PO) Take 1 capsule by mouth daily.     TURMERIC PO Take by mouth. 1,000mg  once a day     vitamin B-12 (CYANOCOBALAMIN) 1000 MCG tablet Take 1,000 mcg by mouth daily.     No current facility-administered medications on file prior to visit.     ROS see history of present  illness  Objective  Physical Exam Vitals:   10/23/22 1517  BP: 112/62  Pulse: 81  Temp: (!) 97.5 F (36.4 C)  SpO2: 98%    BP Readings from Last 3 Encounters:  10/23/22 112/62  08/27/22 110/60  06/24/22 106/70   Wt Readings from Last 3 Encounters:  10/23/22 144 lb (65.3 kg)  08/27/22 147 lb (66.7 kg)  06/24/22 145 lb (65.8 kg)    Physical Exam Constitutional:      General: She is not in acute distress.    Appearance: She is not diaphoretic.  Cardiovascular:     Rate and Rhythm: Normal rate and regular rhythm.     Heart sounds: Normal heart sounds.  Pulmonary:     Effort: Pulmonary effort is normal.  Abdominal:     General: Bowel sounds are normal. There is no distension.     Palpations: Abdomen is soft.     Comments: Slight discomfort to palpation in her lower abdomen  Skin:    General: Skin is warm and dry.  Neurological:     Mental Status: She is alert.      Assessment/Plan: Please see individual problem list.  Diarrhea of presumed infectious origin Assessment & Plan: Recent onset issue.  Discussed concern for infectious cause.  She will be given a stool  collection kit and she will take her stool sample to the lab at the hospital.  Orders placed as outlined.  Discussed treatment options would depend on stool testing results.  Advised against use of Imodium or other antispasmodics until we have testing results.  Orders: -     C Difficile Quick Screen w PCR reflex; Future -     Stool culture; Future -     Ova and parasite examination; Future     Return if symptoms worsen or fail to improve.   Marikay Alar, MD [facility]

## 2022-10-24 ENCOUNTER — Other Ambulatory Visit
Admission: RE | Admit: 2022-10-24 | Discharge: 2022-10-24 | Disposition: A | Discharge status: Home / Self Care | Payer: Medicare Other | Source: Ambulatory Visit | Attending: Family Medicine | Admitting: Family Medicine

## 2022-10-24 DIAGNOSIS — R197 Diarrhea, unspecified: Secondary | ICD-10-CM | POA: Insufficient documentation

## 2022-10-24 LAB — C DIFFICILE QUICK SCREEN W PCR REFLEX
C Diff antigen: NEGATIVE
C Diff interpretation: NOT DETECTED
C Diff toxin: NEGATIVE

## 2022-10-28 ENCOUNTER — Ambulatory Visit: Payer: Medicare Other | Admitting: Family

## 2022-10-28 ENCOUNTER — Encounter: Payer: Self-pay | Admitting: Family

## 2022-10-28 ENCOUNTER — Ambulatory Visit (INDEPENDENT_AMBULATORY_CARE_PROVIDER_SITE_OTHER): Payer: Medicare Other | Admitting: Family

## 2022-10-28 VITALS — BP 118/78 | HR 72 | Temp 97.6°F | Ht 65.5 in | Wt 144.1 lb

## 2022-10-28 DIAGNOSIS — Z1231 Encounter for screening mammogram for malignant neoplasm of breast: Secondary | ICD-10-CM

## 2022-10-28 DIAGNOSIS — M542 Cervicalgia: Secondary | ICD-10-CM | POA: Insufficient documentation

## 2022-10-28 NOTE — Progress Notes (Signed)
Assessment & Plan:  Neck pain Assessment & Plan: Neck pain with radicular symptoms.  Concern for cervicogenic headache.  Discussed pending MRI cervical spine, left shoulder x-ray.  Symptoms improved with aspirin, ice/heat and massage.  Will continue conservative management and await results of MRI.  Consider orthopedic referral, emg study.   Orders: -     DG Shoulder Left; Future -     MR CERVICAL SPINE WO CONTRAST; Future  Encounter for screening mammogram for malignant neoplasm of breast -     3D Screening Mammogram, Left and Right; Future     Return precautions given.   Risks, benefits, and alternatives of the medications and treatment plan prescribed today were discussed, and patient expressed understanding.   Education regarding symptom management and diagnosis given to patient on AVS either electronically or printed.  Return in about 2 months (around 12/28/2022).  Rennie Plowman, FNP  Subjective:    Patient ID: Lynn Peters, female    DOB: 05/26/1955, 67 y.o.   MRN: [id number]  CC: Lynn Peters is a 67 y.o. female who presents today for follow up.   HPI: Complains of posterior neck pain and left scapular pain.   She has been building a siphon for a pond /over flow of water, which includes carrying pipes.   She is using ASA 81mg  with relief  Massage and heat/ice with temporary relief.   She recalls injuries while playing volleyball in her youth.   Endorses left arm numbness. She describes HA associated with neck pain.   Stool is more formed.   She eats a lot of salad.   Bloating has resolved.   No fever, N, V, vision loss.       Seen 5 days ago for diarrhea, Dr Birdie Sons  C. difficile ,Salmonella, shigella and ecoli negative  Still pending others  Follow-up Dr Kirke Corin hyperlipidemia.  Below baseline, not significant to justify treatment with statin  Right sided abdominal pain has resolved after she starting pepcid 10 mg BID.     Allergies:  Pantoprazole sodium, Tetracyclines & related, Breo ellipta [fluticasone furoate-vilanterol], and Flagyl [metronidazole] Current Outpatient Medications on File Prior to Visit  Medication Sig Dispense Refill   albuterol (VENTOLIN HFA) 108 (90 Base) MCG/ACT inhaler INHALE 2 PUFFS INTO THE LUNGS EVERY 4 (FOUR) HOURS AS NEEDED FOR WHEEZING OR SHORTNESS OF BREATH. 18 g 2   Ascorbic Acid (VITAMIN C) 1000 MG tablet Take 1,000 mg by mouth daily.     Cetirizine HCl (ZYRTEC CHILDRENS ALLERGY PO) Take 5 mg by mouth at bedtime. liquid     Cholecalciferol (VITAMIN D3) 50 MCG (2000 UT) CAPS Take by mouth.     clobetasol (TEMOVATE) 0.05 % external solution Patient to pour solution into jar of CeraVe and mix well. Use twice daily to affected areas. Do not apply to face, groin or underarms. 50 mL 0   famotidine (PEPCID) 20 MG tablet Take 1 tablet (20 mg total) by mouth daily. 30 tablet 2   fluticasone (FLOVENT HFA) 44 MCG/ACT inhaler Inhale 2 puffs into the lungs 2 (two) times daily. 1 each 12   Probiotic Product (SOLUBLE FIBER/PROBIOTICS PO) Take 1 capsule by mouth daily.     TURMERIC PO Take by mouth. 1,000mg  once a day     vitamin B-12 (CYANOCOBALAMIN) 1000 MCG tablet Take 1,000 mcg by mouth daily.     No current facility-administered medications on file prior to visit.    Review of Systems  Constitutional:  Negative for chills and fever.  Eyes:  Negative for visual disturbance.  Respiratory:  Negative for cough.   Cardiovascular:  Negative for chest pain and palpitations.  Gastrointestinal:  Negative for nausea and vomiting.  Musculoskeletal:  Positive for back pain.  Neurological:  Positive for numbness and headaches.      Objective:    BP 118/78   Pulse 72   Temp 97.6 F (36.4 C) (Oral)   Ht 5' 5.5" (1.664 m)   Wt 144 lb 1.6 oz (65.4 kg)   BMI 23.61 kg/m  BP Readings from Last 3 Encounters:  10/28/22 118/78  10/23/22 112/62  08/27/22 110/60   Wt Readings from Last 3 Encounters:   10/28/22 144 lb 1.6 oz (65.4 kg)  10/23/22 144 lb (65.3 kg)  08/27/22 147 lb (66.7 kg)    Physical Exam Vitals reviewed.  Constitutional:      Appearance: She is well-developed.  Eyes:     Conjunctiva/sclera: Conjunctivae normal.  Neck:   Cardiovascular:     Rate and Rhythm: Normal rate and regular rhythm.     Pulses: Normal pulses.     Heart sounds: Normal heart sounds.  Pulmonary:     Effort: Pulmonary effort is normal.     Breath sounds: Normal breath sounds. No wheezing, rhonchi or rales.  Musculoskeletal:     Left shoulder: No swelling or tenderness. Normal range of motion.     Cervical back: Muscular tenderness present. No spinous process tenderness.     Comments: Left Shoulder:   No asymmetry of shoulders when comparing right and left.No pain with palpation over glenohumeral joint lines, [place] joint, AC joint, or bicipital groove. No pain with internal and external rotation. No pain with  lateral extension .    Skin:    General: Skin is warm and dry.  Neurological:     Mental Status: She is alert.     Comments: Strength 5 out of 5 bilateral upper extremities.  Psychiatric:        Speech: Speech normal.        Behavior: Behavior normal.        Thought Content: Thought content normal.

## 2022-10-28 NOTE — Patient Instructions (Signed)
   Let us know if you dont hear back within a week in regards to an appointment being scheduled.   So that you are aware, if you are [facility] MyChart user , please pay attention to your MyChart messages as you may receive a MyChart message with a phone number to call and schedule this test/appointment own your own from our referral coordinator. This is a new process so I do not want you to miss this message.  If you are not a MyChart user, you will receive a phone call.

## 2022-10-29 ENCOUNTER — Telehealth: Payer: Self-pay | Admitting: Family

## 2022-10-29 ENCOUNTER — Encounter: Payer: Medicare Other | Admitting: Dermatology

## 2022-10-29 LAB — STOOL CULTURE: E coli, Shiga toxin Assay: NEGATIVE

## 2022-10-29 LAB — STOOL CULTURE REFLEX - RSASHR

## 2022-10-29 LAB — STOOL CULTURE REFLEX - CMPCXR

## 2022-10-29 NOTE — Telephone Encounter (Signed)
Just xrays no labs

## 2022-10-29 NOTE — Telephone Encounter (Signed)
Patient need lab orders.

## 2022-10-30 NOTE — Assessment & Plan Note (Signed)
Neck pain with radicular symptoms.  Concern for cervicogenic headache.  Discussed pending MRI cervical spine, left shoulder x-ray.  Symptoms improved with aspirin, ice/heat and massage.  Will continue conservative management and await results of MRI.  Consider orthopedic referral, emg study.

## 2022-11-04 ENCOUNTER — Ambulatory Visit: Payer: Medicare Other

## 2022-11-04 ENCOUNTER — Other Ambulatory Visit: Payer: Medicare Other

## 2022-11-04 DIAGNOSIS — M542 Cervicalgia: Secondary | ICD-10-CM

## 2022-11-06 ENCOUNTER — Ambulatory Visit
Admission: RE | Admit: 2022-11-06 | Discharge: 2022-11-06 | Disposition: A | Discharge status: Home / Self Care | Payer: Medicare Other | Source: Ambulatory Visit | Attending: Family | Admitting: Family

## 2022-11-06 DIAGNOSIS — M542 Cervicalgia: Secondary | ICD-10-CM | POA: Insufficient documentation

## 2022-11-11 ENCOUNTER — Encounter: Payer: Self-pay | Admitting: Family

## 2022-11-15 ENCOUNTER — Ambulatory Visit: Payer: Medicare Other | Admitting: Oncology

## 2022-11-15 ENCOUNTER — Other Ambulatory Visit: Payer: Medicare Other

## 2022-12-20 ENCOUNTER — Encounter: Payer: Self-pay | Admitting: Family

## 2022-12-27 ENCOUNTER — Other Ambulatory Visit: Payer: Self-pay | Admitting: Family

## 2022-12-27 DIAGNOSIS — R9089 Other abnormal findings on diagnostic imaging of central nervous system: Secondary | ICD-10-CM

## 2022-12-27 NOTE — Progress Notes (Signed)
close

## 2022-12-31 ENCOUNTER — Encounter: Payer: Self-pay | Admitting: Family

## 2023-01-11 ENCOUNTER — Encounter: Payer: Self-pay | Admitting: Family

## 2023-01-11 DIAGNOSIS — J4530 Mild persistent asthma, uncomplicated: Secondary | ICD-10-CM

## 2023-01-13 ENCOUNTER — Other Ambulatory Visit: Payer: Self-pay | Admitting: Family

## 2023-01-13 ENCOUNTER — Other Ambulatory Visit: Payer: Self-pay

## 2023-01-13 MED ORDER — FLUTICASONE PROPIONATE HFA 44 MCG/ACT IN AERO: 2.0000 | INHALATION_SPRAY | Freq: Two times a day (BID) | RESPIRATORY_TRACT | 3 refills | Status: DC

## 2023-01-14 ENCOUNTER — Other Ambulatory Visit: Payer: Self-pay | Admitting: Family

## 2023-01-14 MED ORDER — ALBUTEROL SULFATE HFA 108 (90 BASE) MCG/ACT IN AERS: INHALATION_SPRAY | RESPIRATORY_TRACT | 2 refills | Status: AC

## 2023-01-14 NOTE — Telephone Encounter (Signed)
Pt called stating she needs rx to be sent to [facility] - [place], [place] - [address] not [facility] [address]. Pt states she needs rx sent asap so she won't be without her meds. Pt states [facility] should be sending our office a fax to verify the rx. Pt requested a call back. Call back # [phone]

## 2023-01-15 ENCOUNTER — Telehealth: Payer: Self-pay

## 2023-01-15 MED ORDER — FLUTICASONE PROPIONATE HFA 44 MCG/ACT IN AERO: 2.0000 | INHALATION_SPRAY | Freq: Two times a day (BID) | RESPIRATORY_TRACT | 3 refills | Status: DC

## 2023-01-15 NOTE — Telephone Encounter (Signed)
Rx Flovent sent to [facility]

## 2023-01-15 NOTE — Telephone Encounter (Signed)
LVM to call back to see which specific medications  she needs sent to the [facility]

## 2023-01-17 NOTE — Telephone Encounter (Signed)
SEE previous note in chart referring to [facility]!

## 2023-01-17 NOTE — Telephone Encounter (Signed)
Patient states that she is following up on her previous messages.  I let patient know that our system shows receipt confirmed by pharmacy on 01/15/2023 at 9:25am.  Patient states she just wants to be sure that they have her prescription because this medication has to come from [place] and she doesn't want to run out.

## 2023-01-17 NOTE — Telephone Encounter (Signed)
Called  [facility] pharmacy  at the number given by patient ([phone])  Spoke to [place] and she explained that they have a off site  pharmacy that they partner with so all E- scripts go to that particular pharmacy that  we knew nothing about but I gave verbal order over the phone so pt is able to get her medication before she runs out.Pt has been notified as well

## 2023-01-17 NOTE — Telephone Encounter (Signed)
Pt called wanting to give a number to [facility]  [phone]

## 2023-01-30 ENCOUNTER — Other Ambulatory Visit: Payer: Self-pay | Admitting: Family

## 2023-01-30 ENCOUNTER — Encounter: Payer: Self-pay | Admitting: Family

## 2023-01-30 DIAGNOSIS — R519 Headache, unspecified: Secondary | ICD-10-CM

## 2023-01-31 ENCOUNTER — Telehealth: Payer: Self-pay | Admitting: Family

## 2023-01-31 NOTE — Telephone Encounter (Signed)
PATIENT IS CALLING IN TO MAKE SURE THAT SHE CAN GET HER LIME DISEASE ANTIBODIES TEST DONE AT THE OFFICE I HAVE SCHEDULED THE PATIENT A APPT SHE WOULD LIKE A CALL BACK REGARDING THIS

## 2023-01-31 NOTE — Telephone Encounter (Signed)
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

## 2023-02-03 ENCOUNTER — Other Ambulatory Visit: Payer: Medicare Other

## 2023-02-03 ENCOUNTER — Other Ambulatory Visit (INDEPENDENT_AMBULATORY_CARE_PROVIDER_SITE_OTHER): Payer: Medicare Other

## 2023-02-03 DIAGNOSIS — R519 Headache, unspecified: Secondary | ICD-10-CM

## 2023-02-03 NOTE — Telephone Encounter (Signed)
Spoke to ot and she stated that she will schedule appt following her lab appt today

## 2023-02-04 LAB — LYME DISEASE SEROLOGY W/REFLEX: Lyme Total Antibody EIA: NEGATIVE

## 2023-02-13 ENCOUNTER — Ambulatory Visit: Payer: Medicare Other | Admitting: Family

## 2023-02-13 ENCOUNTER — Encounter: Payer: Self-pay | Admitting: Family

## 2023-02-13 VITALS — BP 126/78 | HR 78 | Temp 98.0°F | Ht 65.0 in | Wt 146.0 lb

## 2023-02-13 DIAGNOSIS — R519 Headache, unspecified: Secondary | ICD-10-CM

## 2023-02-13 NOTE — Progress Notes (Signed)
 Assessment & Plan:  Acute nonintractable headache, unspecified headache type Assessment & Plan: Headache frequency has improved.  Response to aspirin  81 mg as needed.  Neck pain improved with acupuncture and advised to continue acupuncture, physical therapy.  Continue magnesium glycinate Discussed visual changes several months ago.  Strongly reiterated the importance of continued follow-up with [facility] and to ensure ophthalmologist is aware of visual complaints, headache.  We reviewed MRI brain 06/13/22 without evidence of orbital mass, sinus disease. Additionally discussed use of propranolol for headache prevention, anxiety.  We also discussed nortriptyline.  Patient may consider these options in future if headache frequency were to increase.       Return precautions given.   Risks, benefits, and alternatives of the medications and treatment plan prescribed today were discussed, and patient expressed understanding.   Education regarding symptom management and diagnosis given to patient on AVS either electronically or printed.  No follow-ups on file.  Rollene Northern, FNP  Subjective:    Patient ID: Lynn Peters, female    DOB: 1955-10-17, 68 y.o.   MRN: [id number]  CC: Lynn Peters is a 68 y.o. female who presents today for HA follow up.   HPI: HA are improved. HA had been daily for a month and she notes she had had several days without a HA. Last night she had a small glass of wine which is unusual for her and notes slight HA today.   Magnesium glycinate has been helpful.      Headaches typically present posterior neck.  She has been focused on ergonomics as she is studying for school and sitting at a computer.  She has been working to better manage her stress.  She meditates daily.   several months ago she did have left eye vision changes with floaters while filming her husband. No vision loss. She has had intermittent left eye pain since.   She has been doing PT and dry  needling. She has felt acupressure has been the most helpful. Peters 81mg  prn with relief.   Consult neurology 01/02/2023 for suspected migraine with aura.  Seen by physiatry 12/24/2022 for left upper back and neck pain.  Advised to continue occasional use of aspirin .  Gabapentin is not tolerated.  Referral to physical therapy.  Following with [facility]; seen in the last year with dilated eye exam.    Allergies: Pantoprazole  sodium, Tetracyclines & related, Breo ellipta [fluticasone  furoate-vilanterol], and Flagyl  [metronidazole ] Current Outpatient Medications on File Prior to Visit  Medication Sig Dispense Refill   albuterol  (VENTOLIN  HFA) 108 (90 Base) MCG/ACT inhaler INHALE 2 PUFFS INTO THE LUNGS EVERY 4 (FOUR) HOURS AS NEEDED FOR WHEEZING OR SHORTNESS OF BREATH. 18 g 2   Cetirizine HCl (ZYRTEC CHILDRENS ALLERGY PO) Take 5 mg by mouth at bedtime. liquid     Cholecalciferol (VITAMIN D3) 50 MCG (2000 UT) CAPS Take by mouth.     famotidine  (PEPCID ) 20 MG tablet Take 1 tablet (20 mg total) by mouth daily. 30 tablet 2   fluticasone  (FLOVENT  HFA) 44 MCG/ACT inhaler Inhale 2 puffs into the lungs 2 (two) times daily. 10.6 g 3   Probiotic Product (SOLUBLE FIBER/PROBIOTICS PO) Take 1 capsule by mouth daily.     vitamin B-12 (CYANOCOBALAMIN) 1000 MCG tablet Take 1,000 mcg by mouth daily.     Ascorbic Acid (VITAMIN C) 1000 MG tablet Take 1,000 mg by mouth daily. (Patient not taking: Reported on 02/13/2023)     clobetasol  (TEMOVATE ) 0.05 % external solution Patient to  pour solution into jar of CeraVe and mix well. Use twice daily to affected areas. Do not apply to face, groin or underarms. (Patient not taking: Reported on 02/13/2023) 50 mL 0   TURMERIC PO Take by mouth. 1,000mg  once a day (Patient not taking: Reported on 02/13/2023)     No current facility-administered medications on file prior to visit.    Review of Systems  Constitutional:  Negative for chills and fever.  HENT:  Negative for  congestion.   Eyes:  Positive for visual disturbance. Negative for photophobia and redness.  Respiratory:  Negative for cough.   Cardiovascular:  Negative for chest pain and palpitations.  Gastrointestinal:  Negative for nausea and vomiting.  Neurological:  Positive for headaches. Negative for dizziness.      Objective:    BP 126/78   Pulse 78   Temp 98 F (36.7 C) (Oral)   Ht 5' 5 (1.651 m)   Wt 146 lb (66.2 kg)   SpO2 99%   BMI 24.30 kg/m  BP Readings from Last 3 Encounters:  02/13/23 126/78  10/28/22 118/78  10/23/22 112/62   Wt Readings from Last 3 Encounters:  02/13/23 146 lb (66.2 kg)  10/28/22 144 lb 1.6 oz (65.4 kg)  10/23/22 144 lb (65.3 kg)    Physical Exam Vitals reviewed.  Constitutional:      Appearance: She is well-developed.  Eyes:     Conjunctiva/sclera: Conjunctivae normal.  Cardiovascular:     Rate and Rhythm: Normal rate and regular rhythm.     Pulses: Normal pulses.     Heart sounds: Normal heart sounds.  Pulmonary:     Effort: Pulmonary effort is normal.     Breath sounds: Normal breath sounds. No wheezing, rhonchi or rales.  Skin:    General: Skin is warm and dry.  Neurological:     Mental Status: She is alert.  Psychiatric:        Speech: Speech normal.        Behavior: Behavior normal.        Thought Content: Thought content normal.

## 2023-02-13 NOTE — Patient Instructions (Addendum)
 Would consider propranolol.   Nortriptyline may also can be considered.    So glad headaches are better.   I would speak to ophthalmologist left eye pain, associated with floaters. I would notify them MRI brain and that you have been seeing neurology for suspect migraine with aura.

## 2023-02-13 NOTE — Assessment & Plan Note (Signed)
 Headache frequency has improved.  Response to aspirin  81 mg as needed.  Neck pain improved with acupuncture and advised to continue acupuncture, physical therapy.  Continue magnesium glycinate Discussed visual changes several months ago.  Strongly reiterated the importance of continued follow-up with [facility] and to ensure ophthalmologist is aware of visual complaints, headache.  We reviewed MRI brain 06/13/22 without evidence of orbital mass, sinus disease. Additionally discussed use of propranolol for headache prevention, anxiety.  We also discussed nortriptyline.  Patient may consider these options in future if headache frequency were to increase.

## 2023-02-20 ENCOUNTER — Ambulatory Visit: Payer: Medicare Other | Admitting: Family

## 2023-04-15 ENCOUNTER — Ambulatory Visit: Payer: Medicare Other | Admitting: Dermatology

## 2023-04-15 DIAGNOSIS — W908XXA Exposure to other nonionizing radiation, initial encounter: Secondary | ICD-10-CM | POA: Diagnosis not present

## 2023-04-15 DIAGNOSIS — D229 Melanocytic nevi, unspecified: Secondary | ICD-10-CM

## 2023-04-15 DIAGNOSIS — L821 Other seborrheic keratosis: Secondary | ICD-10-CM

## 2023-04-15 DIAGNOSIS — D2271 Melanocytic nevi of right lower limb, including hip: Secondary | ICD-10-CM

## 2023-04-15 DIAGNOSIS — Z1283 Encounter for screening for malignant neoplasm of skin: Secondary | ICD-10-CM | POA: Diagnosis not present

## 2023-04-15 DIAGNOSIS — L814 Other melanin hyperpigmentation: Secondary | ICD-10-CM | POA: Diagnosis not present

## 2023-04-15 DIAGNOSIS — D2372 Other benign neoplasm of skin of left lower limb, including hip: Secondary | ICD-10-CM

## 2023-04-15 DIAGNOSIS — D2262 Melanocytic nevi of left upper limb, including shoulder: Secondary | ICD-10-CM

## 2023-04-15 DIAGNOSIS — L578 Other skin changes due to chronic exposure to nonionizing radiation: Secondary | ICD-10-CM | POA: Diagnosis not present

## 2023-04-15 DIAGNOSIS — R202 Paresthesia of skin: Secondary | ICD-10-CM

## 2023-04-15 DIAGNOSIS — D1801 Hemangioma of skin and subcutaneous tissue: Secondary | ICD-10-CM

## 2023-04-15 DIAGNOSIS — D225 Melanocytic nevi of trunk: Secondary | ICD-10-CM

## 2023-04-15 DIAGNOSIS — Q825 Congenital non-neoplastic nevus: Secondary | ICD-10-CM

## 2023-04-15 DIAGNOSIS — D239 Other benign neoplasm of skin, unspecified: Secondary | ICD-10-CM

## 2023-04-15 NOTE — Progress Notes (Signed)
 Follow-Up Visit   Subjective  Lynn Peters is a 68 y.o. female who presents for the following: Skin Cancer Screening and Full Body Skin Exam  The patient presents for Total-Body Skin Exam (TBSE) for skin cancer screening and mole check. The patient has spots, moles and lesions to be evaluated, some may be new or changing and the patient may have concern these could be cancer.  No hx skin cancer.   The following portions of the chart were reviewed this encounter and updated as appropriate: medications, allergies, medical history  Review of Systems:  No other skin or systemic complaints except as noted in HPI or Assessment and Plan.  Objective  Well appearing patient in no apparent distress; mood and affect are within normal limits.  A full examination was performed including scalp, head, eyes, ears, nose, lips, neck, chest, axillae, abdomen, back, buttocks, bilateral upper extremities, bilateral lower extremities, hands, feet, fingers, toes, fingernails, and toenails. All findings within normal limits unless otherwise noted below.   Relevant physical exam findings are noted in the Assessment and Plan.    Assessment & Plan   SKIN CANCER SCREENING PERFORMED TODAY.  ACTINIC DAMAGE - Chronic condition, secondary to cumulative UV/sun exposure - diffuse scaly erythematous macules with underlying dyspigmentation - Recommend daily broad spectrum sunscreen SPF 30+ to sun-exposed areas, reapply every 2 hours as needed.  - Staying in the shade or wearing long sleeves, sun glasses (UVA+UVB protection) and wide brim hats (4-inch brim around the entire circumference of the hat) are also recommended for sun protection.  - Call for new or changing lesions.  LENTIGINES, SEBORRHEIC KERATOSES (including right wrist), HEMANGIOMAS - Benign normal skin lesions - Benign-appearing - Call for any changes  MELANOCYTIC NEVI - Tan-brown and/or pink-flesh-colored symmetric macules and papules - right  lateral thigh 3 x 2 mm medium brown macule  - left upper abdomen- 4 mm brown macule  - left shoulder- 3 mm brown macule  - Benign appearing on exam today - Observation - Call clinic for new or changing moles - Recommend daily use of broad spectrum spf 30+ sunscreen to sun-exposed areas.   DERMATOFIBROMA Exam: Firm pink/brown papulenodule with dimple sign, left ant thigh. Treatment Plan: A dermatofibroma is a benign growth possibly related to trauma, such as an insect bite, cut from shaving, or inflamed acne-type bump.  Treatment options to remove include shave or excision with resulting scar and risk of recurrence.  Since benign-appearing and not bothersome, will observe for now.   Congenital non-neoplastic nevus Right Lower Back  10 x 6 mm medium dark brown speckled macule- pt states she has had this since childhood, no changes when compared to baseline photo 10/23/21.    Benign features under dermoscopy. Stable compared to previous photo. Observation. Call clinic for new or changing lesions.  Recommend daily use of broad spectrum spf 30+ sunscreen to sun-exposed areas.   NOTALGIA PARESTHETICA Exam: Hyperpigmented patch, mild scaling at right spinal mid back.  Chronic and persistent condition with duration or expected duration over one year. Condition is symptomatic/ bothersome to patient. Not currently at goal.   Chronic condition without cure secondary to pinched nerve along spine causing itching or sensation changes in an area of skin. Chronic rubbing or scratching causes darkening of the skin.  Treatment Plan:  May use Clobetasol/CeraVe mix twice daily as needed for itch. Pt has at home- will call for rfs. Avoid applying to face, groin, and axilla. Use as directed. Long-term use can cause thinning  of the skin.  OTC treatments which can help with itch include numbing creams like pramoxine or lidocaine which temporarily reduce itch or Capsaicin-containing creams which cause a burning  sensation but which sometimes over time will reset the nerves to stop producing itch.  If you choose to use Capsaicin cream, it is recommended to use it 5 times daily for 1 week followed by 3 times daily for 3-6 weeks. You may have to continue using it long-term.  If not doing well with OTC options, could consider [facility] compounded prescription anti-itch cream with Amitriptyline 5% / Lidocaine 5% / Pramoxine 1% or Amitriptyline 5% / Gabapentin 10% / Lidocaine 5% Cream or other prescription cream or pill options.    Return in about 1 year (around 04/14/2024) for TBSE.  ICherlyn Labella, CMA, am acting as scribe for Willeen Niece, MD .   Documentation: I have reviewed the above documentation for accuracy and completeness, and I agree with the above.  Willeen Niece, MD

## 2023-04-15 NOTE — Patient Instructions (Addendum)
 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 This is usually accompanied by chronic rubbing or scratching often leaving the area of skin discolored and thickened. There is no cure, but there are some treatments which may help control the itch.   Clobetasol/CeraVe mix - Apply to itchy area on back once to twice daily until improved. Avoid applying to face, groin, and axilla. Use as directed. Long-term use can cause thinning of the skin.  Over the counter (non-prescription) treatments for notalgia paresthetica include numbing creams like pramoxine or lidocaine which temporarily reduce itch or Capsaicin-containing creams which cause a burning sensation but which sometimes over time will reset the nerves to stop producing itch.  If you choose to use Capsaicin cream, it is recommended to use it 5 times daily for 1 week followed by 3 times daily for 3-6 weeks. You may have to continue using it long-term. - If not doing well with OTC options, could consider [employer] compounded prescription anti-itch cream with Amitriptyline 5% / Lidocaine 5% / Pramoxine 1% or Amitriptyline 5% / Gabapentin 10% / Lidocaine 5% Cream. For severe cases, there are some prescription cream or pill options which may help. Other treatment options include: - Transcutaneous Electrical Nerve Stimulation (TENS) - Gabapentin 300-900 mg daily po - Amitriptyline orally - Paravertebral local anesthetic block - intralesional Botulinum toxin A  Due to recent changes in healthcare laws, you may see results of your pathology and/or laboratory studies on MyChart before the doctors have had a chance to review them. We understand that in some cases there may be results that are confusing or concerning to you. Please understand that not all results are received at the same time and often the doctors may need to  interpret multiple results in order to provide you with the best plan of care or course of treatment. Therefore, we ask that you please give [place] 2 business days to thoroughly review all your results before contacting the office for clarification. Should we see a critical lab result, you will be contacted sooner.   If You Need Anything After Your Visit  If you have any questions or concerns for your doctor, please call our main line at [phone] and press option 4 to reach your doctor's medical assistant. If no one answers, please leave a voicemail as directed and we will return your call as soon as possible. Messages left after 4 pm will be answered the following business day.   You may also send [place] a message via MyChart. We typically respond to MyChart messages within 1-2 business days.  For prescription refills, please ask your pharmacy to contact our office. Our fax number is [phone].  If you have an urgent issue when the clinic is closed that cannot wait until the next business day, you can page your doctor at the number below.    Please note that while we do our best to be available for urgent issues outside of office hours, we are not available 24/7.   If you have an urgent issue and are unable to reach [place], you may choose to seek medical care at your doctor's office, retail clinic, urgent care center, or emergency room.  If you have a medical emergency, please immediately call 911 or go to the emergency department.  Pager Numbers  - Dr. Gwen Pounds: [phone]  - Dr. Roseanne Reno: [phone]  -  Dr. Katrinka Blazing: [phone]   In the event of inclement weather, please call our main line at [phone] for an update on the status of any delays or closures.  Dermatology Medication Tips: Please keep the boxes that topical medications come in in order to help keep track of the instructions about where and how to use these. Pharmacies typically print the medication instructions only on  the boxes and not directly on the medication tubes.   If your medication is too expensive, please contact our office at [phone] option 4 or send [place] a message through MyChart.   We are unable to tell what your co-pay for medications will be in advance as this is different depending on your insurance coverage. However, we may be able to find a substitute medication at lower cost or fill out paperwork to get insurance to cover a needed medication.   If a prior authorization is required to get your medication covered by your insurance company, please allow [place] 1-2 business days to complete this process.  Drug prices often vary depending on where the prescription is filled and some pharmacies may offer cheaper prices.  The website www.goodrx.com contains coupons for medications through different pharmacies. The prices here do not account for what the cost may be with help from insurance (it may be cheaper with your insurance), but the website can give you the price if you did not use any insurance.  - You can print the associated coupon and take it with your prescription to the pharmacy.  - You may also stop by our office during regular business hours and pick up a GoodRx coupon card.  - If you need your prescription sent electronically to a different pharmacy, notify our office through [facility] or by phone at [phone] option 4.     Si Usted Necesita Algo Despus de Su Visita  Tambin puede enviarnos un mensaje a travs de Clinical cytogeneticist. Por lo general respondemos a los mensajes de MyChart en el transcurso de 1 a 2 das hbiles.  Para renovar recetas, por favor pida a su farmacia que se ponga en contacto con nuestra oficina. Annie Sable de fax es [place] [phone].  Si tiene un asunto urgente cuando la clnica est cerrada y que no puede esperar hasta el siguiente da hbil, puede llamar/localizar a su doctor(a) al nmero que aparece a continuacin.   Por favor, tenga en cuenta que  aunque hacemos todo lo posible para estar disponibles para asuntos urgentes fuera del horario de [place], no estamos disponibles las 24 horas del da, los 7 [address]  [address] de la [place].   Si tiene un problema urgente y no puede comunicarse con nosotros, puede optar por buscar atencin mdica  en el consultorio de su doctor(a), en una clnica privada, en un centro de atencin urgente o en una sala de emergencias.  Si tiene Engineer, drilling, por favor llame inmediatamente al 911 o vaya a la sala de emergencias.  Nmeros de bper  - Dr. Gwen Pounds: [phone]  - Dra. Roseanne Reno: [phone]  - Dr. Katrinka Blazing: [phone]   En caso de inclemencias del tiempo, por favor llame a Lacy Duverney principal al [phone] para una actualizacin sobre el [place] de cualquier retraso o cierre.  Consejos para la medicacin en dermatologa: Por favor, guarde las cajas en las que vienen los medicamentos de uso tpico para ayudarle a seguir las instrucciones sobre dnde y cmo usarlos. Las farmacias generalmente imprimen las instrucciones del medicamento slo en las cajas y no directamente en  los tubos del medicamento.   Si su medicamento es muy caro, por favor, pngase en contacto con Rolm Gala llamando al [phone] y presione la opcin 4 o envenos un mensaje a travs de Clinical cytogeneticist.   No podemos decirle cul ser su copago por los medicamentos por adelantado ya que esto es diferente dependiendo de la cobertura de su seguro. Sin embargo, es posible que podamos encontrar un medicamento sustituto a Audiological scientist un formulario para que el seguro cubra el medicamento que se considera necesario.   Si se requiere una autorizacin previa para que su compaa de seguros [place] su medicamento, por favor permtanos de 1 a 2 das hbiles para completar [address]  Los precios de los medicamentos varan con frecuencia dependiendo del Environmental consultant de dnde se surte la receta y alguna farmacias pueden ofrecer precios ms  baratos.  El sitio web www.goodrx.com tiene cupones para medicamentos de Health and safety inspector. Los precios aqu no tienen en cuenta lo que podra costar con la ayuda del seguro (puede ser ms barato con su seguro), pero el sitio web puede darle el precio si no utiliz Tourist information centre manager.  - Puede imprimir el cupn correspondiente y llevarlo con su receta a la farmacia.  - Tambin puede pasar por nuestra oficina durante el horario de atencin regular y Education officer, museum una tarjeta de cupones de GoodRx.  - Si necesita que su receta se enve electrnicamente a una farmacia diferente, informe a nuestra oficina a travs de MyChart de [place] o por telfono llamando al [phone] y presione la opcin 4.

## 2023-06-09 ENCOUNTER — Telehealth: Payer: Self-pay | Admitting: Family

## 2023-06-09 NOTE — Telephone Encounter (Signed)
 Called pt to reschedule their 08/14/23 visit, Lynn Peters will not be in the office that day. No answer, unable to leave message. Sent pt MyChart message to call and reschedule, or to reschedule via MyChart.  E2C2, please reschedule the pt's 7/3 visit when they call back. [facility]

## 2023-08-13 ENCOUNTER — Ambulatory Visit (INDEPENDENT_AMBULATORY_CARE_PROVIDER_SITE_OTHER): Admitting: Dermatology

## 2023-08-13 DIAGNOSIS — W908XXA Exposure to other nonionizing radiation, initial encounter: Secondary | ICD-10-CM

## 2023-08-13 DIAGNOSIS — L821 Other seborrheic keratosis: Secondary | ICD-10-CM | POA: Diagnosis not present

## 2023-08-13 DIAGNOSIS — L578 Other skin changes due to chronic exposure to nonionizing radiation: Secondary | ICD-10-CM | POA: Diagnosis not present

## 2023-08-13 DIAGNOSIS — I781 Nevus, non-neoplastic: Secondary | ICD-10-CM

## 2023-08-13 NOTE — Patient Instructions (Addendum)

## 2023-08-13 NOTE — Progress Notes (Signed)
   Follow-Up Visit   Subjective  Lynn Peters is a 68 y.o. female who presents for the following: bumps on the left cheek x ~6 mos, one area is crusted.   The following portions of the chart were reviewed this encounter and updated as appropriate: medications, allergies, medical history  Review of Systems:  No other skin or systemic complaints except as noted in HPI or Assessment and Plan.  Objective  Well appearing patient in no apparent distress; mood and affect are within normal limits.  A focused examination was performed of the following areas: Face  Relevant physical exam findings are noted in the Assessment and Plan.    Assessment & Plan   ACTINIC DAMAGE - chronic, secondary to cumulative UV radiation exposure/sun exposure over time - diffuse scaly erythematous macules with underlying dyspigmentation - Recommend daily broad spectrum sunscreen SPF 30+ to sun-exposed areas, reapply every 2 hours as needed.  - Recommend staying in the shade or wearing long sleeves, sun glasses (UVA+UVB protection) and wide brim hats (4-inch brim around the entire circumference of the hat). - Call for new or changing lesions.  SEBORRHEIC KERATOSIS - Stuck-on, waxy, tan-brown papules on face, including L malar cheek - Benign-appearing - Discussed benign etiology and prognosis. 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   Treatment Plan: Benign appearing on exam Call for changes   Return as scheduled, for TBSE.  IAndrea Kerns, CMA, am acting as scribe for Rexene Rattler, MD .   Documentation: I have reviewed the above documentation for accuracy and completeness, and I agree with the above.  Rexene Rattler, MD

## 2023-08-14 ENCOUNTER — Ambulatory Visit: Admitting: Family

## 2023-08-21 ENCOUNTER — Encounter: Payer: Self-pay | Admitting: Family

## 2023-08-21 ENCOUNTER — Ambulatory Visit (INDEPENDENT_AMBULATORY_CARE_PROVIDER_SITE_OTHER): Admitting: Family

## 2023-08-21 VITALS — BP 128/72 | HR 75 | Temp 97.0°F | Ht 65.5 in | Wt 146.0 lb

## 2023-08-21 DIAGNOSIS — Z87898 Personal history of other specified conditions: Secondary | ICD-10-CM | POA: Diagnosis not present

## 2023-08-21 DIAGNOSIS — R7303 Prediabetes: Secondary | ICD-10-CM

## 2023-08-21 DIAGNOSIS — R7309 Other abnormal glucose: Secondary | ICD-10-CM

## 2023-08-21 DIAGNOSIS — R519 Headache, unspecified: Secondary | ICD-10-CM

## 2023-08-21 DIAGNOSIS — I493 Ventricular premature depolarization: Secondary | ICD-10-CM

## 2023-08-21 DIAGNOSIS — L680 Hirsutism: Secondary | ICD-10-CM | POA: Diagnosis not present

## 2023-08-21 DIAGNOSIS — J4530 Mild persistent asthma, uncomplicated: Secondary | ICD-10-CM

## 2023-08-21 DIAGNOSIS — I700 Atherosclerosis of aorta: Secondary | ICD-10-CM | POA: Diagnosis not present

## 2023-08-21 DIAGNOSIS — K219 Gastro-esophageal reflux disease without esophagitis: Secondary | ICD-10-CM

## 2023-08-21 NOTE — Assessment & Plan Note (Signed)
 Fortunately resolved with acupuncture, magnesium glycinate. Will monitor.

## 2023-08-21 NOTE — Progress Notes (Signed)
 Assessment & Plan:  Hirsutism -     CBC with Differential/Platelet; Future -     TSH; Future -     Testosterone , Free and Total; Future  Atherosclerosis of aorta (HCC) -     CBC with Differential/Platelet; Future -     Comprehensive metabolic panel with GFR; Future -     Lipid panel; Future -     TSH; Future -     Testosterone , Free and Total; Future  History of prediabetes -     Hemoglobin A1c; Future -     TSH; Future  Elevated glucose level -     Hemoglobin A1c; Future  PVC (premature ventricular contraction) -     TSH; Future  Prediabetes -     TSH; Future  Gastroesophageal reflux disease, unspecified whether esophagitis present Assessment & Plan: Chronic, stable. Continue pepcid  1-2 tablets daily scheduled or as needed for symptoms.      Mild persistent asthma without complication Assessment & Plan: Chronic, symptomatically stable at this time.  Continue Flovent  1 puff twice daily, albuterol  as needed.    Acute nonintractable headache, unspecified headache type Assessment & Plan: Fortunately resolved with acupuncture, magnesium glycinate. Will monitor.       Return precautions given.   Risks, benefits, and alternatives of the medications and treatment plan prescribed today were discussed, and patient expressed understanding.   Education regarding symptom management and diagnosis given to patient on AVS either electronically or printed.  Return in about 6 months (around 02/21/2024) for Fasting labs in 2-3 weeks.  Rollene Northern, FNP  Subjective:    Patient ID: Lynn Peters, female    DOB: 1955-10-15, 68 y.o.   MRN: [id number]  CC: Lynn Peters is a 68 y.o. female who presents today for follow up.   HPI: HA has resolved with acupuncture  She continues to take magnesium glycinate.  Overall feels well in regards to her health. She is staying active walking, occassional strength excercises   She would lab baseline labs checked She had seen endocrine  08/2020 and would like testosterone rechecked in setting of erythrocytosis and prior history of elevated testosterone.  She uses albuterol  prn with relief. Compliant with flovent  daily. She takes pepcid  1-1.5 tablet with relief of epigastric pain.   She states she will schedule mammogram when ready. Mammogram is overdue Allergies: Pantoprazole  sodium, Tetracyclines & related, Breo ellipta [fluticasone  furoate-vilanterol], and Flagyl  [metronidazole ] Current Outpatient Medications on File Prior to Visit  Medication Sig Dispense Refill   albuterol  (VENTOLIN  HFA) 108 (90 Base) MCG/ACT inhaler INHALE 2 PUFFS INTO THE LUNGS EVERY 4 (FOUR) HOURS AS NEEDED FOR WHEEZING OR SHORTNESS OF BREATH. 18 g 2   Cetirizine HCl (ZYRTEC CHILDRENS ALLERGY PO) Take 5 mg by mouth at bedtime. liquid     Cholecalciferol (VITAMIN D3) 50 MCG (2000 UT) CAPS Take by mouth.     famotidine  (PEPCID ) 20 MG tablet Take 1 tablet (20 mg total) by mouth daily. 30 tablet 2   fluticasone  (FLOVENT  HFA) 44 MCG/ACT inhaler Inhale 2 puffs into the lungs 2 (two) times daily. 10.6 g 3   Probiotic Product (SOLUBLE FIBER/PROBIOTICS PO) Take 1 capsule by mouth daily.     vitamin B-12 (CYANOCOBALAMIN) 1000 MCG tablet Take 1,000 mcg by mouth daily.     No current facility-administered medications on file prior to visit.    Review of Systems  Constitutional:  Negative for chills and fever.  Respiratory:  Negative for cough.   Cardiovascular:  Negative for chest pain and palpitations.  Gastrointestinal:  Negative for nausea and vomiting.      Objective:    BP 128/72   Pulse 75   Temp (!) 97 F (36.1 C) (Oral)   Ht 5' 5.5 (1.664 m)   Wt 146 lb (66.2 kg)   BMI 23.93 kg/m  BP Readings from Last 3 Encounters:  08/21/23 128/72  02/13/23 126/78  10/28/22 118/78   Wt Readings from Last 3 Encounters:  08/21/23 146 lb (66.2 kg)  02/13/23 146 lb (66.2 kg)  10/28/22 144 lb 1.6 oz (65.4 kg)    Physical Exam Vitals reviewed.   Constitutional:      Appearance: She is well-developed.  Eyes:     Conjunctiva/sclera: Conjunctivae normal.  Cardiovascular:     Rate and Rhythm: Normal rate and regular rhythm.     Pulses: Normal pulses.     Heart sounds: Normal heart sounds.  Pulmonary:     Effort: Pulmonary effort is normal.     Breath sounds: Normal breath sounds. No wheezing, rhonchi or rales.  Skin:    General: Skin is warm and dry.  Neurological:     Mental Status: She is alert.  Psychiatric:        Speech: Speech normal.        Behavior: Behavior normal.        Thought Content: Thought content normal.

## 2023-08-21 NOTE — Assessment & Plan Note (Signed)
 Chronic, stable. Continue pepcid  1-2 tablets daily scheduled or as needed for symptoms.

## 2023-08-21 NOTE — Assessment & Plan Note (Signed)
 Chronic, symptomatically stable at this time.  Continue Flovent  1 puff twice daily, albuterol  as needed.

## 2023-08-21 NOTE — Patient Instructions (Signed)
Nice to see you!   

## 2023-08-25 ENCOUNTER — Telehealth: Payer: Self-pay | Admitting: Family

## 2023-08-25 ENCOUNTER — Other Ambulatory Visit (INDEPENDENT_AMBULATORY_CARE_PROVIDER_SITE_OTHER)

## 2023-08-25 DIAGNOSIS — Z87898 Personal history of other specified conditions: Secondary | ICD-10-CM | POA: Diagnosis not present

## 2023-08-25 DIAGNOSIS — L680 Hirsutism: Secondary | ICD-10-CM

## 2023-08-25 DIAGNOSIS — R7303 Prediabetes: Secondary | ICD-10-CM

## 2023-08-25 DIAGNOSIS — I493 Ventricular premature depolarization: Secondary | ICD-10-CM | POA: Diagnosis not present

## 2023-08-25 DIAGNOSIS — R7309 Other abnormal glucose: Secondary | ICD-10-CM | POA: Diagnosis not present

## 2023-08-25 DIAGNOSIS — I700 Atherosclerosis of aorta: Secondary | ICD-10-CM | POA: Diagnosis not present

## 2023-08-25 LAB — CBC WITH DIFFERENTIAL/PLATELET
Basophils Absolute: 0.1 K/uL (ref 0.0–0.1)
Basophils Relative: 1 % (ref 0.0–3.0)
Eosinophils Absolute: 0.1 K/uL (ref 0.0–0.7)
Eosinophils Relative: 1.3 % (ref 0.0–5.0)
HCT: 46.4 % — ABNORMAL HIGH (ref 36.0–46.0)
Hemoglobin: 15.1 g/dL — ABNORMAL HIGH (ref 12.0–15.0)
Lymphocytes Relative: 26.8 % (ref 12.0–46.0)
Lymphs Abs: 1.7 K/uL (ref 0.7–4.0)
MCHC: 32.6 g/dL (ref 30.0–36.0)
MCV: 89.4 fl (ref 78.0–100.0)
Monocytes Absolute: 0.4 K/uL (ref 0.1–1.0)
Monocytes Relative: 7 % (ref 3.0–12.0)
Neutro Abs: 4 K/uL (ref 1.4–7.7)
Neutrophils Relative %: 63.9 % (ref 43.0–77.0)
Platelets: 280 K/uL (ref 150.0–400.0)
RBC: 5.19 Mil/uL — ABNORMAL HIGH (ref 3.87–5.11)
RDW: 13.1 % (ref 11.5–15.5)
WBC: 6.3 K/uL (ref 4.0–10.5)

## 2023-08-25 LAB — LIPID PANEL
Cholesterol: 171 mg/dL (ref 0–200)
HDL: 52.7 mg/dL (ref >39.00)
LDL Cholesterol: 101 mg/dL — ABNORMAL HIGH (ref 0–99)
NonHDL: 118.74
Total CHOL/HDL Ratio: 3
Triglycerides: 89 mg/dL (ref 0.0–149.0)
VLDL: 17.8 mg/dL (ref 0.0–40.0)

## 2023-08-25 LAB — COMPREHENSIVE METABOLIC PANEL WITH GFR
ALT: 13 U/L (ref 0–35)
AST: 18 U/L (ref 0–37)
Albumin: 4.2 g/dL (ref 3.5–5.2)
Alkaline Phosphatase: 50 U/L (ref 39–117)
BUN: 14 mg/dL (ref 6–23)
CO2: 18 meq/L — ABNORMAL LOW (ref 19–32)
Calcium: 9.7 mg/dL (ref 8.4–10.5)
Chloride: 106 meq/L (ref 96–112)
Creatinine, Ser: 0.86 mg/dL (ref 0.40–1.20)
GFR: 69.4 mL/min (ref >60.00)
Glucose, Bld: 92 mg/dL (ref 70–99)
Potassium: 4.2 meq/L (ref 3.5–5.1)
Sodium: 137 meq/L (ref 135–145)
Total Bilirubin: 0.7 mg/dL (ref 0.2–1.2)
Total Protein: 7.1 g/dL (ref 6.0–8.3)

## 2023-08-25 LAB — HEMOGLOBIN A1C: Hgb A1c MFr Bld: 5.6 % (ref 4.6–6.5)

## 2023-08-25 LAB — TSH: TSH: 1.96 u[IU]/mL (ref 0.35–5.50)

## 2023-08-25 MED ORDER — FLUTICASONE PROPIONATE HFA 44 MCG/ACT IN AERO: 2.0000 | INHALATION_SPRAY | Freq: Two times a day (BID) | RESPIRATORY_TRACT | 3 refills | Status: AC

## 2023-08-25 NOTE — Telephone Encounter (Signed)
 Copied from CRM [id number]. Topic: Clinical - Medication Refill >> Aug 25, 2023  2:54 PM [place] J wrote: Medication: fluticasone  (FLOVENT  HFA) 44 MCG/ACT inhaler  Has the patient contacted their pharmacy? Yes (Agent: If no, request that the patient contact the pharmacy for the refill. If patient does not wish to contact the pharmacy document the reason why and proceed with request.) (Agent: If yes, when and what did the pharmacy advise?) Pharmacy is calling for refill request. Representative stated that they initially faxed the request.  This is the patient's preferred pharmacy:  [facility] [place], [place] [address] [place] [address] Phone: [phone] Fax: [phone]  Is this the correct pharmacy for this prescription? Yes If no, delete pharmacy and type the correct one.   Has the prescription been filled recently? No  Is the patient out of the medication? Yes  Has the patient been seen for an appointment in the last year OR does the patient have an upcoming appointment? Yes  Can we respond through MyChart? Yes  Agent: Please be advised that Rx refills may take up to 3 business days. We ask that you follow-up with your pharmacy.

## 2023-08-30 LAB — TESTOSTERONE, FREE & TOTAL
Free Testosterone: 14.4 pg/mL — ABNORMAL HIGH (ref 0.1–6.4)
Testosterone, Total, LC-MS-MS: 134 ng/dL — ABNORMAL HIGH (ref 2–45)

## 2023-09-01 ENCOUNTER — Ambulatory Visit: Payer: Self-pay | Admitting: Family

## 2023-09-01 DIAGNOSIS — D751 Secondary polycythemia: Secondary | ICD-10-CM

## 2023-09-01 DIAGNOSIS — R7989 Other specified abnormal findings of blood chemistry: Secondary | ICD-10-CM

## 2023-09-03 ENCOUNTER — Encounter: Payer: Self-pay | Admitting: Family

## 2023-09-04 ENCOUNTER — Other Ambulatory Visit: Payer: Self-pay | Admitting: Family

## 2023-09-04 DIAGNOSIS — D751 Secondary polycythemia: Secondary | ICD-10-CM

## 2023-09-04 DIAGNOSIS — R899 Unspecified abnormal finding in specimens from other organs, systems and tissues: Secondary | ICD-10-CM

## 2023-09-04 NOTE — Telephone Encounter (Signed)
 Pt would like update on referral to Endo

## 2023-09-09 ENCOUNTER — Other Ambulatory Visit: Payer: Self-pay | Admitting: Family

## 2023-09-09 ENCOUNTER — Encounter: Payer: Self-pay | Admitting: Family

## 2023-09-09 DIAGNOSIS — I700 Atherosclerosis of aorta: Secondary | ICD-10-CM

## 2023-09-10 ENCOUNTER — Ambulatory Visit
Admission: RE | Admit: 2023-09-10 | Discharge: 2023-09-10 | Disposition: A | Discharge status: Home / Self Care | Payer: Self-pay | Source: Ambulatory Visit | Attending: Family | Admitting: Family

## 2023-09-10 DIAGNOSIS — I700 Atherosclerosis of aorta: Secondary | ICD-10-CM | POA: Insufficient documentation

## 2023-09-15 ENCOUNTER — Ambulatory Visit: Payer: Self-pay | Admitting: Family

## 2023-09-15 NOTE — Telephone Encounter (Signed)
 Copied from CRM [id number]. Topic: Clinical - Lab/Test Results >> Sep 15, 2023 10:58 AM [place] E wrote: Reason for CRM: Patient called in and would like to go over her CT cardiac scoring results. Callback number [phone].

## 2023-09-25 ENCOUNTER — Encounter: Payer: Self-pay | Admitting: Family

## 2023-09-30 ENCOUNTER — Ambulatory Visit: Admitting: Family

## 2023-10-01 IMAGING — DX DG LUMBAR SPINE COMPLETE 4+V
5 series · 5 of 5 positions shown · non-contrast
Comparison: None.

CLINICAL DATA: Left-sided low back pain.

EXAM:
LUMBAR SPINE - COMPLETE 4+ VIEW

[lumbar spine ap]
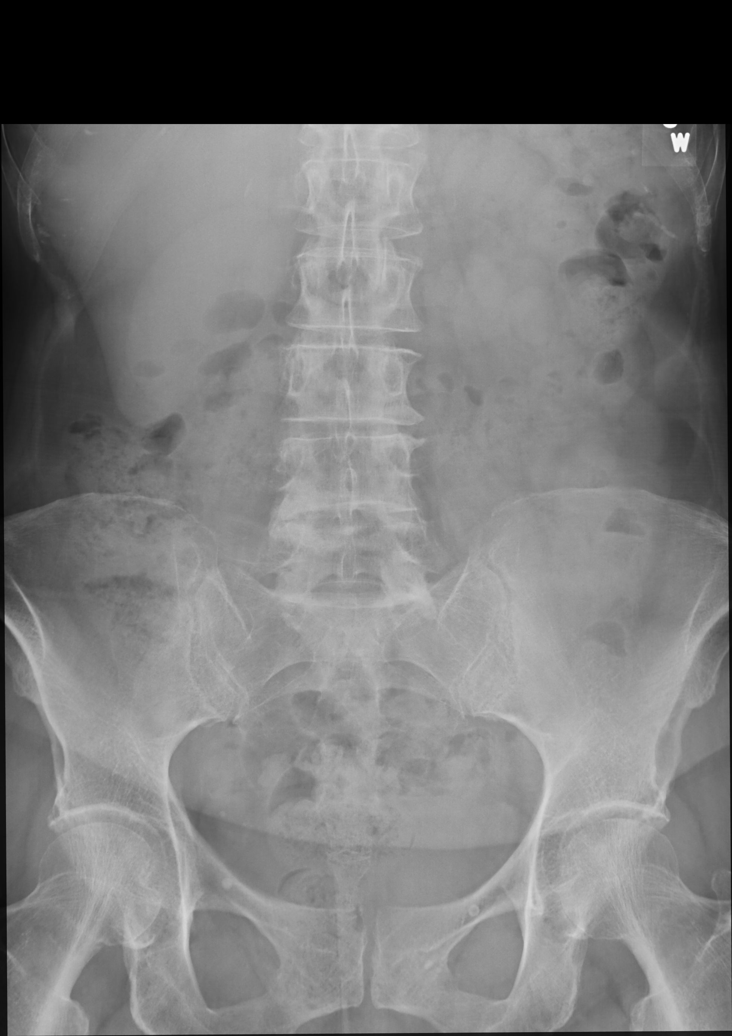

[lumbar spine obl (oblique) (1 of 2)]
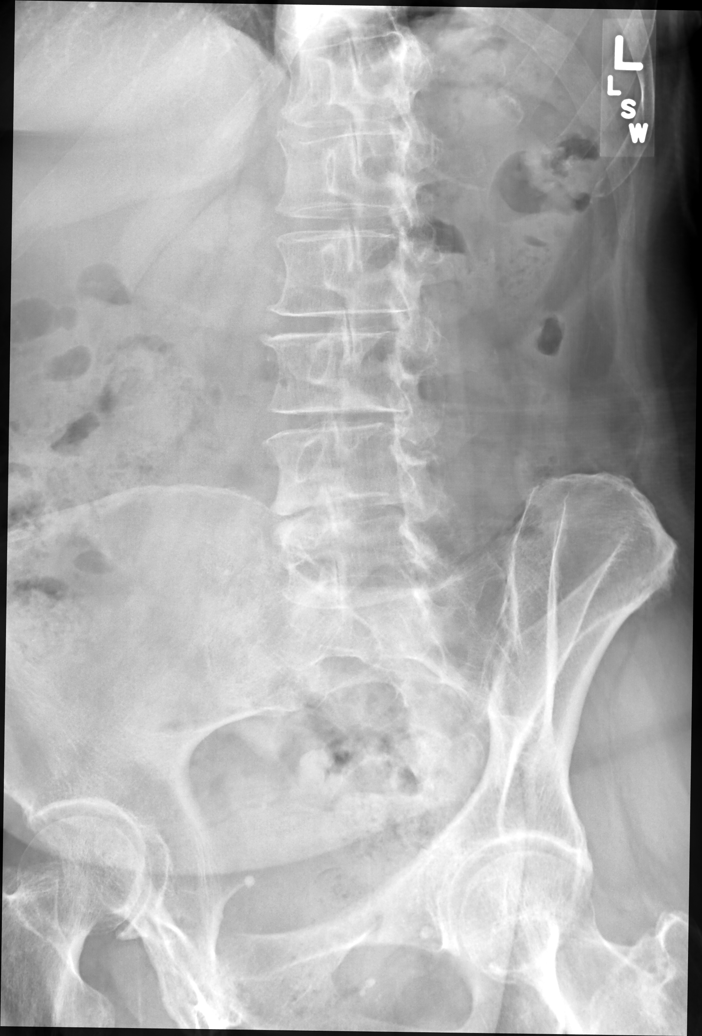

[lumbar spine obl (oblique) (2 of 2)]
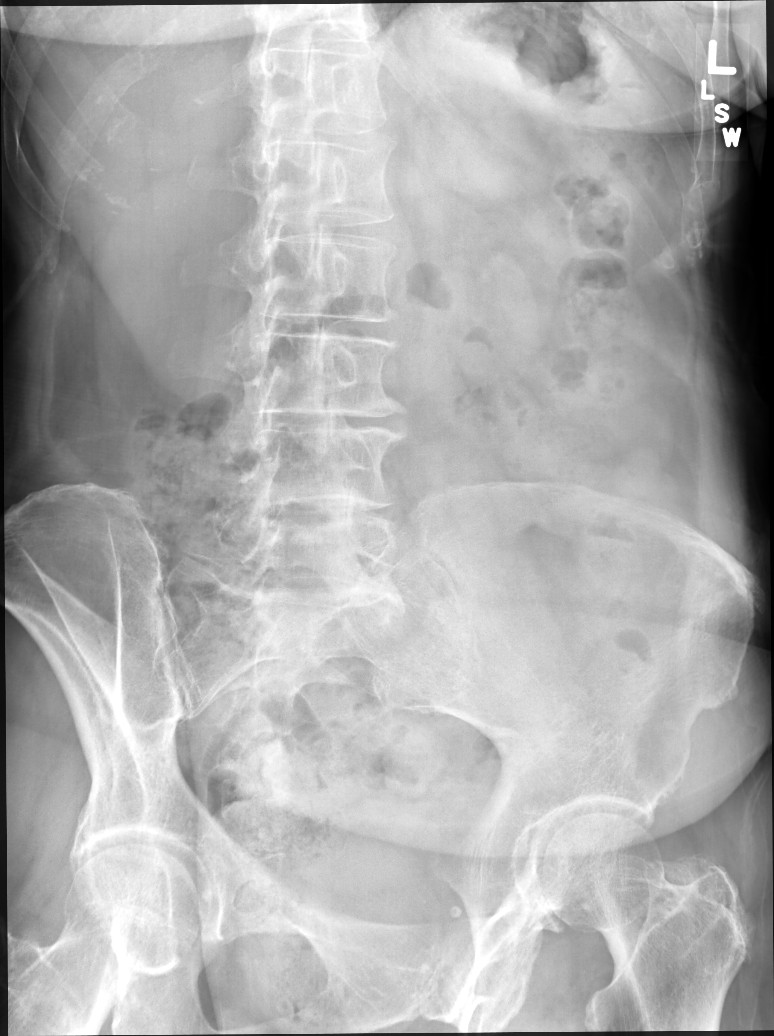

[lumbar spine lat]
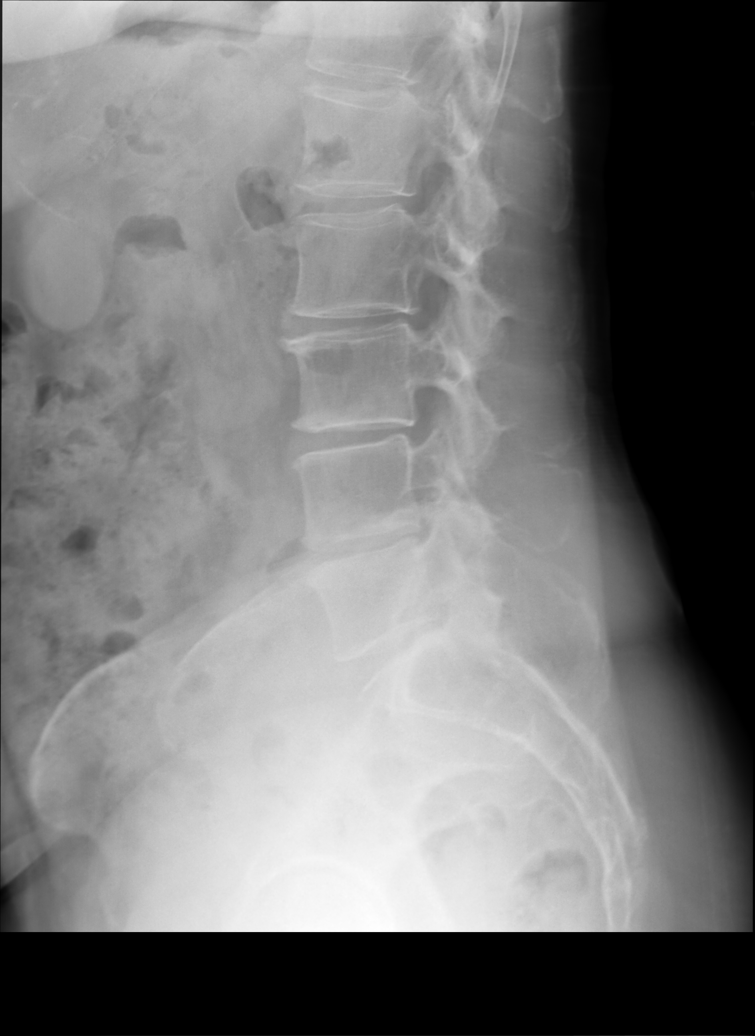

[lumbar spot lat]
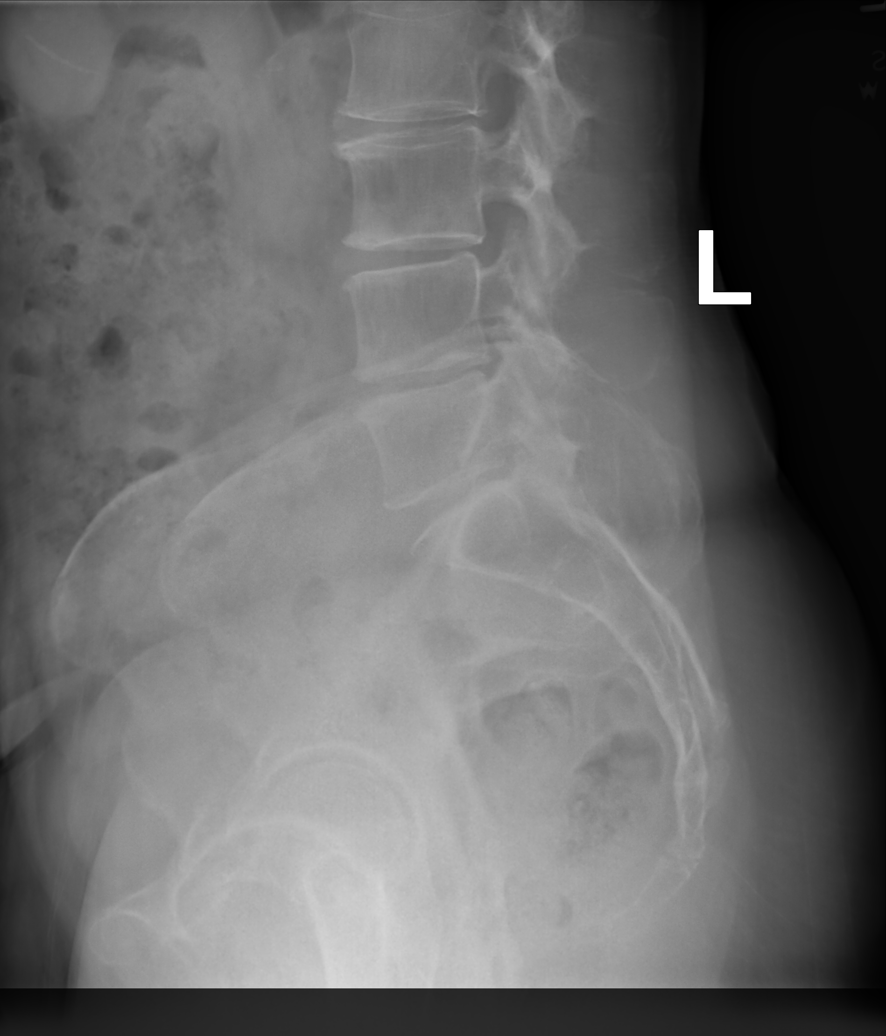

[5 of 5 positions shown; findings below may reference images not displayed]

FINDINGS: Minimal grade 1 retrolisthesis of L3-4 is noted secondary to
moderate degenerative disc disease at this level. Moderate
degenerative disc disease is noted at L2-3 and L4-5. No fracture is
noted.
IMPRESSION: Moderate multilevel degenerative disc disease. No acute abnormality
is noted.

## 2023-10-01 IMAGING — DX DG ANKLE COMPLETE 3+V*L*
4 series · 4 of 4 positions shown · non-contrast
Comparison: None.

CLINICAL DATA: Left ankle swelling after injury.

EXAM:
LEFT ANKLE COMPLETE - 3+ VIEW

[ankle ap]
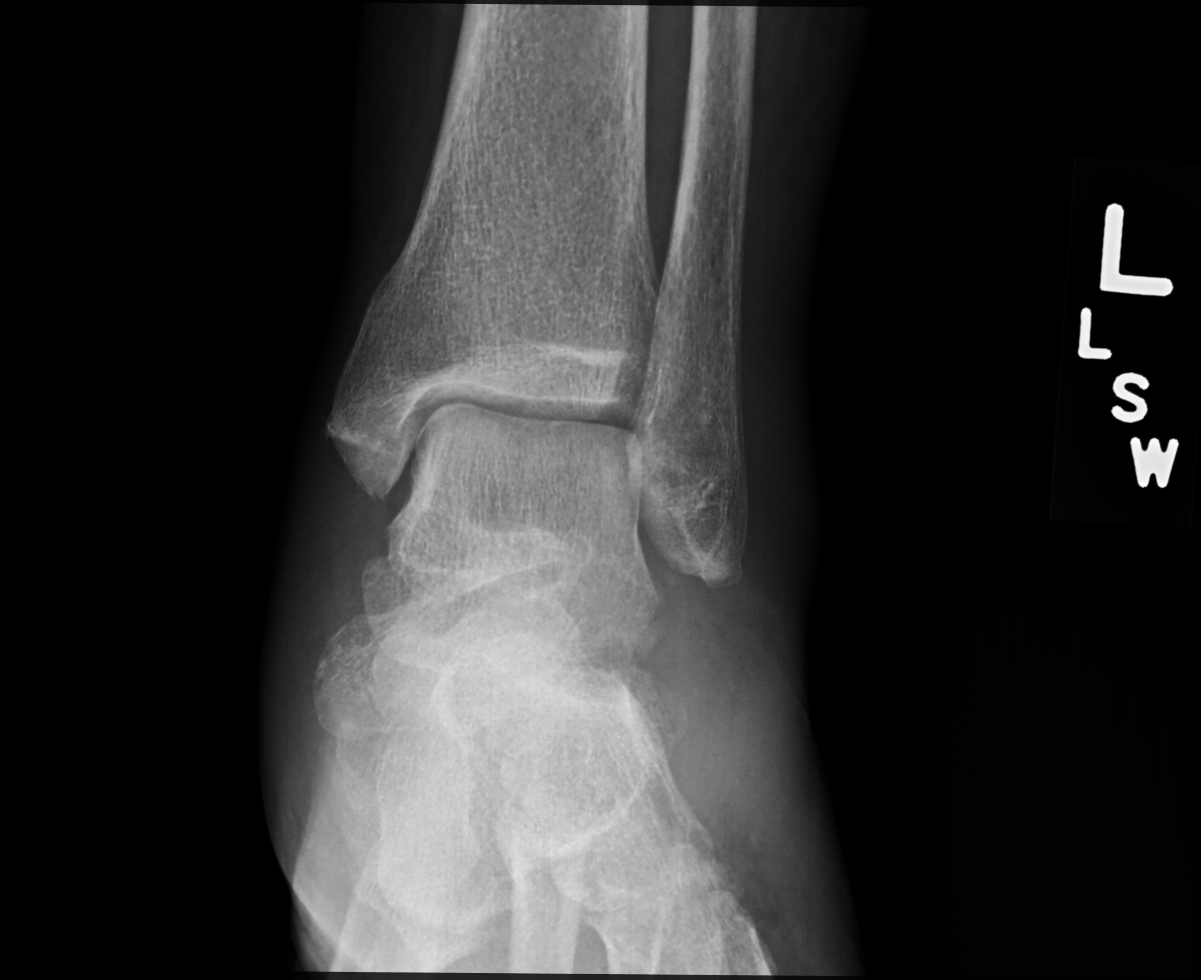

[ankle obl (oblique) (1 of 2)]
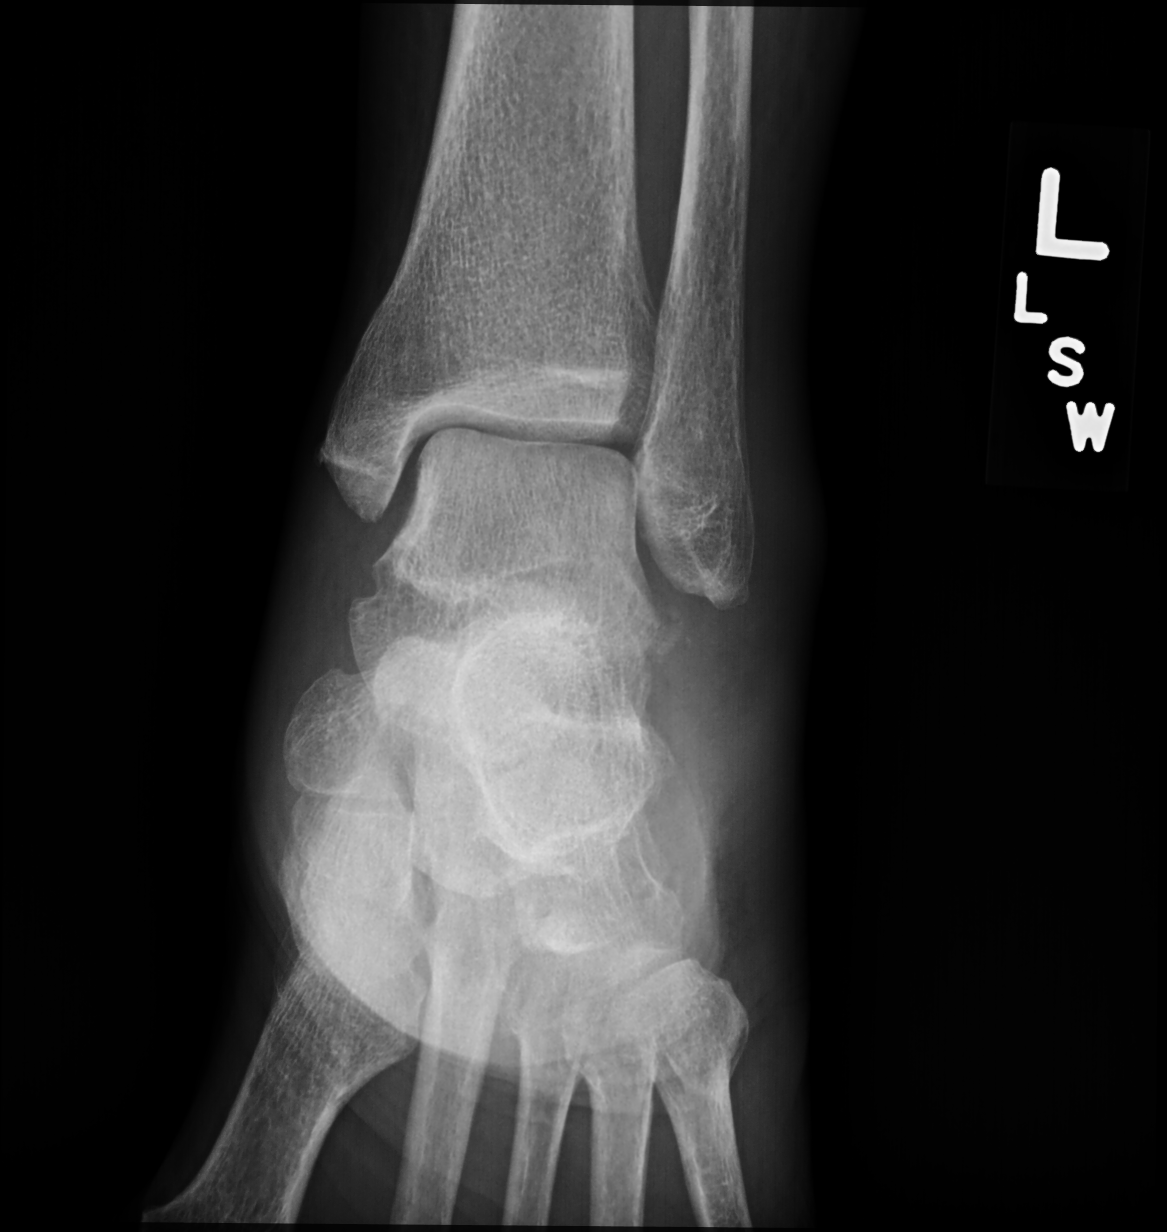

[ankle obl (oblique) (2 of 2)]
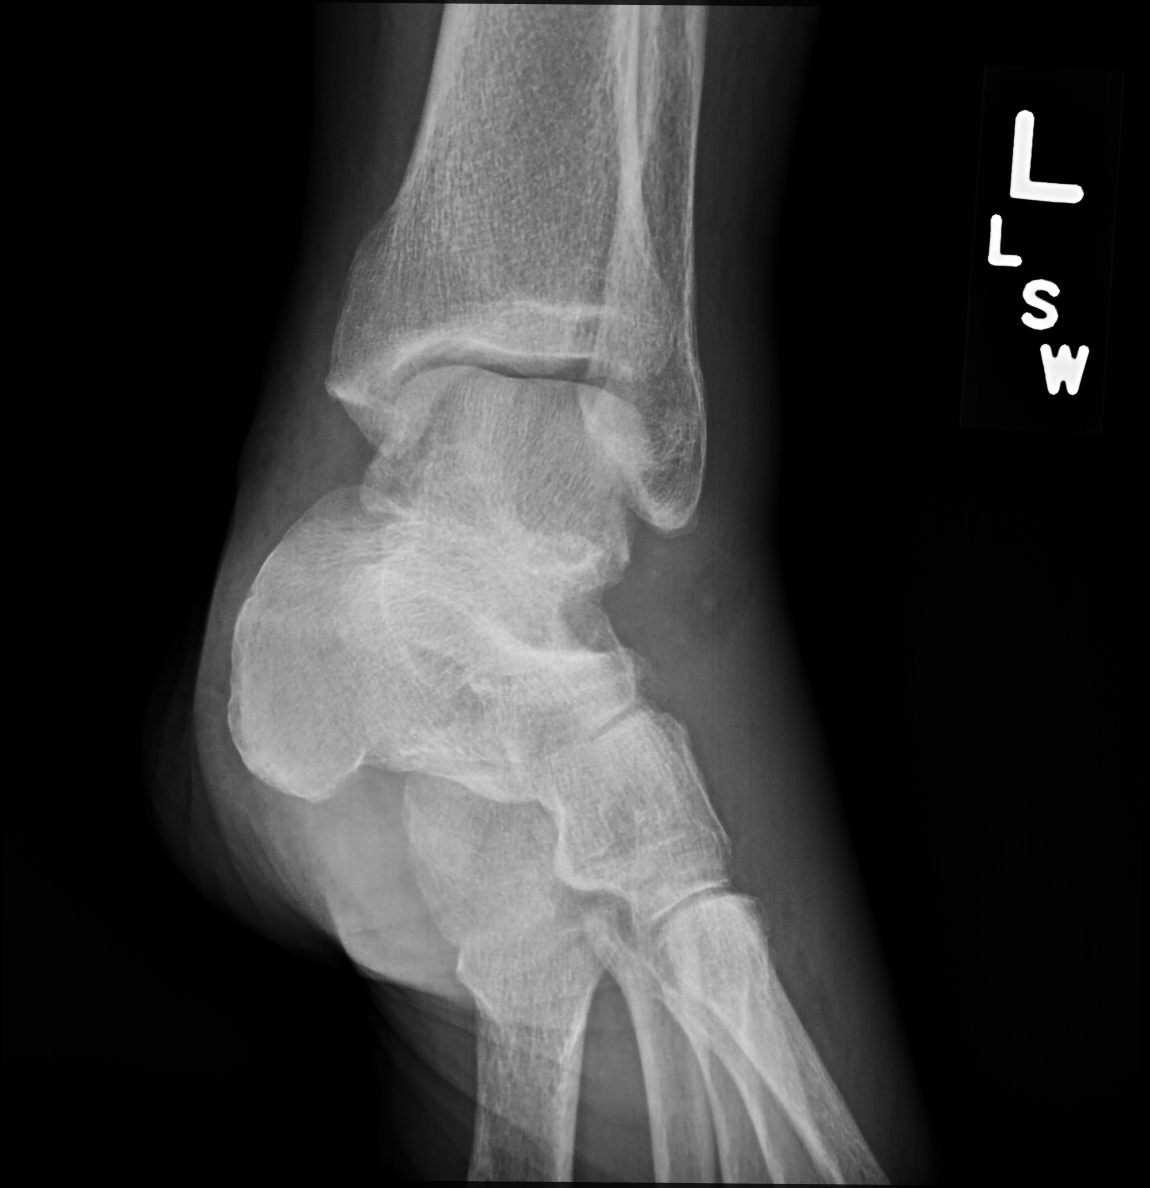

[ankle lat]
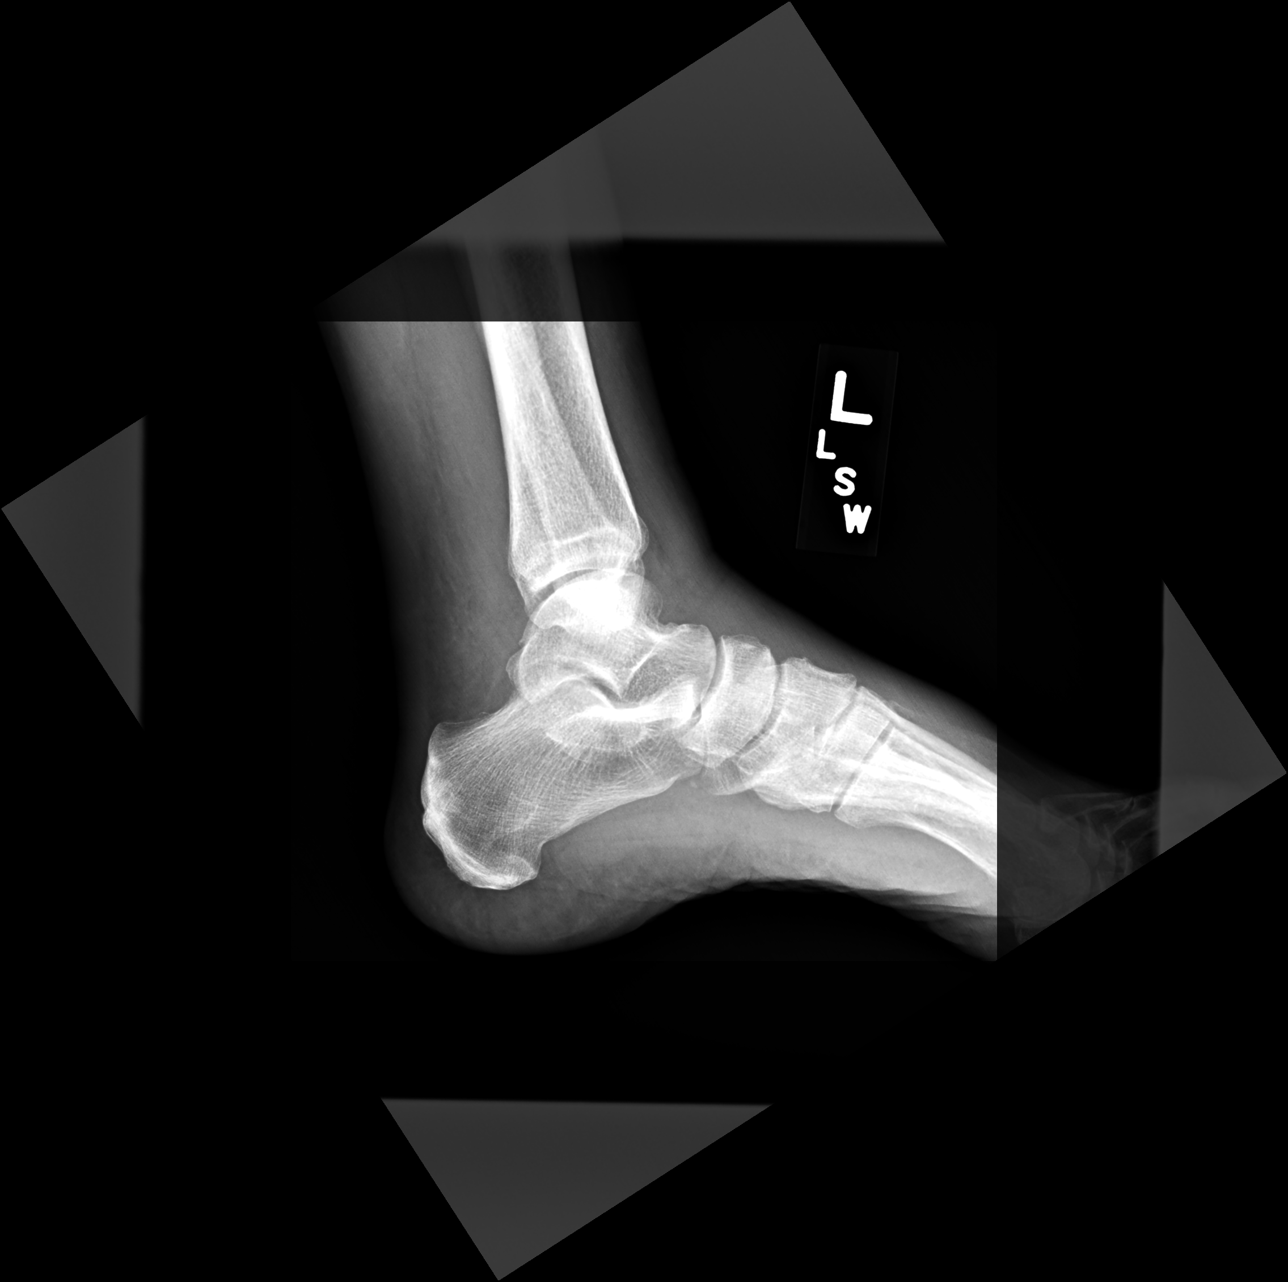

[4 of 4 positions shown; findings below may reference images not displayed]

FINDINGS: There is no evidence of fracture, dislocation, or joint effusion.
There is no evidence of arthropathy or other focal bone abnormality.
Soft tissue swelling over lateral malleolus is noted.
IMPRESSION: No fracture or dislocation is noted. Lateral soft tissue swelling is
noted.

## 2023-10-02 ENCOUNTER — Encounter: Payer: Self-pay | Admitting: Family

## 2023-10-02 NOTE — Telephone Encounter (Signed)
 Reviewed pt's chart Dr. Darron left message for [facility]. Notified pt of his recommendations

## 2023-10-02 NOTE — Telephone Encounter (Deleted)
 Called pt and advised her that Rollene was out of office until next week, but we would send the message to her. I advised her that someone would call her next week once we hear back from provider.

## 2023-10-20 ENCOUNTER — Encounter: Payer: Self-pay | Admitting: Family

## 2023-10-21 ENCOUNTER — Ambulatory Visit: Payer: Self-pay | Admitting: Family

## 2023-10-21 ENCOUNTER — Other Ambulatory Visit (INDEPENDENT_AMBULATORY_CARE_PROVIDER_SITE_OTHER)

## 2023-10-21 DIAGNOSIS — D751 Secondary polycythemia: Secondary | ICD-10-CM | POA: Diagnosis not present

## 2023-10-21 LAB — CBC WITH DIFFERENTIAL/PLATELET
Basophils Absolute: 0.1 K/uL (ref 0.0–0.1)
Basophils Relative: 0.8 % (ref 0.0–3.0)
Eosinophils Absolute: 0.2 K/uL (ref 0.0–0.7)
Eosinophils Relative: 2.4 % (ref 0.0–5.0)
HCT: 45.5 % (ref 36.0–46.0)
Hemoglobin: 15.1 g/dL — ABNORMAL HIGH (ref 12.0–15.0)
Lymphocytes Relative: 33.4 % (ref 12.0–46.0)
Lymphs Abs: 2.5 K/uL (ref 0.7–4.0)
MCHC: 33.1 g/dL (ref 30.0–36.0)
MCV: 88.3 fl (ref 78.0–100.0)
Monocytes Absolute: 0.7 K/uL (ref 0.1–1.0)
Monocytes Relative: 9 % (ref 3.0–12.0)
Neutro Abs: 4.1 K/uL (ref 1.4–7.7)
Neutrophils Relative %: 54.4 % (ref 43.0–77.0)
Platelets: 279 K/uL (ref 150.0–400.0)
RBC: 5.16 Mil/uL — ABNORMAL HIGH (ref 3.87–5.11)
RDW: 13.1 % (ref 11.5–15.5)
WBC: 7.5 K/uL (ref 4.0–10.5)

## 2023-10-27 LAB — TESTOSTERONE, FREE & TOTAL
Free Testosterone: 20 pg/mL — ABNORMAL HIGH (ref 0.1–6.4)
Testosterone, Total, LC-MS-MS: 149 ng/dL — ABNORMAL HIGH (ref 2–45)

## 2023-10-31 NOTE — Progress Notes (Signed)
 Noted.  Thank you for the referral

## 2023-11-12 ENCOUNTER — Encounter: Payer: Self-pay | Admitting: Internal Medicine

## 2023-11-12 ENCOUNTER — Ambulatory Visit (INDEPENDENT_AMBULATORY_CARE_PROVIDER_SITE_OTHER): Admitting: Internal Medicine

## 2023-11-12 VITALS — BP 100/70 | HR 74 | Ht 65.5 in | Wt 147.0 lb

## 2023-11-12 DIAGNOSIS — E288 Other ovarian dysfunction: Secondary | ICD-10-CM | POA: Diagnosis not present

## 2023-11-12 NOTE — Progress Notes (Unsigned)
 Name: Lynn Peters  MRN/ DOB: [id number], [birth year]    Age/ Sex: 68 y.o., female    PCP: Dineen Rollene MATSU, FNP   Reason for Endocrinology Evaluation: Elevated testosterone      Date of Initial Endocrinology Evaluation: 11/12/2023     HPI: [name] is a 68 y.o. female with a past medical history of elevated testosterone , prediabetes. The patient presented for initial endocrinology clinic visit on 11/12/2023 for consultative assistance with her elevated testosterone .   Patient has been noted with elevated testosterone  in July, 2022 at 95 NG/DL, testosterone  max 149 on 10/21/2023 .  This was noted during evaluation for hirsutism.  It was believed that her erythrocytosis is related to elevated testosterone   Patient with low DHEA-S and  normal 17-hydroxyprogesterone in July, 2022 at 43 NG/DL TFTs have been normal  Abdominal CT in April, 2024 showed adrenals to be unremarkable, uterus not seen  Pelvic ultrasound showed normal-appearing ovaries bilaterally  Per hematology note she has been tested negative for JAK2 V617F mutations. She did have a sleep study in 2019 which showed no significant OSA  The patient has noted excessive hair growth under the chin for over 3 decades, this has been worsening in nature, no extra hair on chest/nipples No prior history of testosterone  use  Menarche age 89, patient has history of regular menstruations.  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 She recently started chinese herbs  Has developed headaches and seeing accupuncterist [employer] with husband who is a Technical sales engineer  No change in voice  Has noted increase facial hair growth  Weight fluctuates since knee replacement  Bedtime 11pm-1 pm , sleeps 8-9 hrs a night, wakes up refreshed in the  morning  No snoring  Has GERD symptoms which wakes her up at night  She has lifelong asthma and uses steroidal inhalers but no oral      HISTORY:  Past Medical History:  Past Medical History:  Diagnosis Date   Allergy    Anxiety    Arthritis    Asthma    Chest pain    Chicken pox    Complication of anesthesia    Difficulty breathing after GYN procedure   Diverticulitis 2009   GERD (gastroesophageal reflux disease)    Pneumonia    Past Surgical History:  Past Surgical History:  Procedure Laterality Date   ABDOMINAL HYSTERECTOMY  2004   partial; has cervix and ovaries per patient; done for fibroids, noncancerous   ANTERIOR CRUCIATE LIGAMENT REPAIR Right 2000   COLONOSCOPY WITH PROPOFOL  N/A 04/28/2019   Procedure: COLONOSCOPY WITH PROPOFOL ;  Surgeon: Unk Corinn Skiff, MD;  Location: [facility] END[facility];  Service: Gastroenterology;  Laterality: N/A;   ESOPHAGOGASTRODUODENOSCOPY (EGD) WITH PROPOFOL  N/A 04/28/2019   Procedure: ESOPHAGOGASTRODUODENOSCOPY (EGD) WITH PROPOFOL ;  Surgeon: Unk Corinn Skiff, MD;  Location: [facility] ENDOSCOPY;  Service: Gastroenterology;  Laterality: N/A;   LAPAROSCOPY     TOTAL KNEE ARTHROPLASTY Right 06/02/2020   Procedure: RIGHT TOTAL KNEE ARTHROPLASTY;  Surgeon: Vernetta Lonni GRADE, MD;  Location: [facility];  Service: Orthopedics;  Laterality: Right;  Needs RNFA   UTERINE FIBROID SURGERY      Social History:  reports that she has never smoked. She has been exposed to tobacco smoke. She has never used smokeless tobacco. She  reports current alcohol use. She reports that she does not use drugs. Family History: family history includes Alcohol abuse in her brother; Alzheimer's disease in her mother; Asthma in her mother and sister; Diabetes in her father, mother, and sister; Heart disease in her brother and father; Hyperlipidemia in her father and mother; Hypertension in her mother; Stroke (age of onset: 35) in her mother.   HOME MEDICATIONS: Allergies as  of 11/12/2023       Reactions   Pantoprazole  Sodium Shortness Of Breath   Tetracyclines & Related    Breo Ellipta [fluticasone  Furoate-vilanterol] Palpitations   Flagyl  [metronidazole ] Other (See Comments)   Neck pain, spasm        Medication List        Accurate as of November 12, 2023 11:24 AM. If you have any questions, ask your nurse or doctor.          albuterol  108 (90 Base) MCG/ACT inhaler Commonly known as: Ventolin  HFA INHALE 2 PUFFS INTO THE LUNGS EVERY 4 (FOUR) HOURS AS NEEDED FOR WHEEZING OR SHORTNESS OF BREATH.   cyanocobalamin 1000 MCG tablet Commonly known as: VITAMIN B12 Take 1,000 mcg by mouth daily.   famotidine  20 MG tablet Commonly known as: PEPCID  Take 1 tablet (20 mg total) by mouth daily.   fluticasone  44 MCG/ACT inhaler Commonly known as: Flovent  HFA Inhale 2 puffs into the lungs 2 (two) times daily.   GNP Biotin 2500 MCG Chew Generic drug: Biotin Chew 1 tablet by mouth daily at 6 (six) AM.   MAGNESIUM GLYCINATE PO Take 1 tablet by mouth daily at 6 (six) AM.   QC TUMERIC COMPLEX PO Take 450 mg by mouth daily at 6 (six) AM.   SOLUBLE FIBER/PROBIOTICS PO Take 1 capsule by mouth daily.   vitamin D3 50 MCG (2000 UT) Caps Take by mouth.   ZYRTEC CHILDRENS ALLERGY PO Take 5 mg by mouth at bedtime. liquid          REVIEW OF SYSTEMS: A comprehensive ROS was conducted with the patient and is negative except as per HPI and below:  ROS     OBJECTIVE:  VS: BP 100/70 (BP Location: Left Arm, Patient Position: Sitting, Cuff Size: Large)   Pulse 74   Ht 5' 5.5 (1.664 m)   Wt 147 lb (66.7 kg) Comment: patient reported  SpO2 98%   BMI 24.09 kg/m    Wt Readings from Last 3 Encounters:  11/12/23 147 lb (66.7 kg)  08/21/23 146 lb (66.2 kg)  02/13/23 146 lb (66.2 kg)     EXAM: General: Pt appears well and is in NAD  Eyes: External eye exam normal without stare, lid lag or exophthalmos.  EOM intact.  PERRL.  Neck: General: Supple  without adenopathy. Thyroid : Thyroid  size normal.  No goiter or nodules appreciated. No thyroid  bruit.  Lungs: Clear with good BS bilat   Heart: Auscultation: RRR.  Abdomen: Soft, nontender  Extremities:  BL LE: No pretibial edema   Mental Status: Judgment, insight: Intact Orientation: Oriented to time, place, and person Mood and affect: No depression, anxiety, or agitation     DATA REVIEWED: ***     Latest Reference Range & Units 08/25/23 11:10  Sodium 135 - 145 mEq/L 137  Potassium 3.5 - 5.1 mEq/L 4.2  Chloride 96 - 112 mEq/L 106  CO2 19 - 32 mEq/L 18 (L)  Glucose 70 - 99 mg/dL 92  BUN 6 - 23 mg/dL 14  Creatinine 9.59 - 8.79 mg/dL 9.13  Calcium 8.4 - 10.5 mg/dL 9.7  Alkaline Phosphatase 39 - 117 U/L 50  Albumin 3.5 - 5.2 g/dL 4.2  AST 0 - 37 U/L 18  ALT 0 - 35 U/L 13  Total Protein 6.0 - 8.3 g/dL 7.1  Total Bilirubin 0.2 - 1.2 mg/dL 0.7  GFR >39.99 mL/min 69.40  (L): Data is abnormally low   ASSESSMENT/PLAN/RECOMMENDATIONS:   Elevated testosterone     Medications :  Signed electronically by: Stefano Redgie Butts, MD  [facility] Endocrinology  [facility] [address] [place] [place], [place] [address] Phone: [phone] FAX: [phone]   CC: Dineen Rollene MATSU, FNP [address] [place] [address] Phone: [phone] Fax: [phone]   Return to Endocrinology clinic as below: Future Appointments  Date Time Provider Department Center  02/23/2024 12:00 PM Dineen Rollene MATSU, FNP [facility] [id number]  04/13/2024  3:15 PM Jackquline Sawyer, MD [facility] None

## 2023-11-12 NOTE — Patient Instructions (Signed)
 24-Hour Urine Collection   You will be collecting your urine for a 24-hour period of time.  Your timer starts with your first urine of the morning (For example - If you first pee at 9AM, your timer will start at 9AM)  Throw away your first urine of the morning  Collect your urine every time you pee for the next 24 hours STOP your urine collection 24 hours after you started the collection (For example - You would stop at 9AM the day after you started)

## 2023-11-14 ENCOUNTER — Ambulatory Visit: Payer: Self-pay | Admitting: Internal Medicine

## 2023-11-14 ENCOUNTER — Other Ambulatory Visit

## 2023-11-15 ENCOUNTER — Encounter: Payer: Self-pay | Admitting: Family

## 2023-11-21 LAB — CORTISOL, URINE, 24 HOUR
24 Hour urine volume (VMAHVA): 2300 mL
CREATININE, URINE: 0.85 g/(24.h) (ref 0.50–2.15)
Cortisol (Ur), Free: 21.6 ug/(24.h) (ref 4.0–50.0)

## 2023-11-22 LAB — PROLACTIN: Prolactin: 6.7 ng/mL

## 2023-11-22 LAB — INSULIN-LIKE GROWTH FACTOR
IGF-I, LC/MS: 163 ng/mL (ref 41–279)
Z-Score (Female): 0.8 {STDV} (ref ?–2.0)

## 2023-11-22 LAB — ESTRADIOL: Estradiol: 40 pg/mL

## 2023-11-22 LAB — 17-HYDROXYPROGESTERONE: 17-OH-Progesterone, LC/MS/MS: 68 ng/dL

## 2023-11-22 LAB — DHEA-SULFATE: DHEA-SO4: 10 ug/dL (ref 9–118)

## 2023-11-22 LAB — ACTH: C206 ACTH: 23 pg/mL (ref 6–50)

## 2023-11-22 LAB — LUTEINIZING HORMONE: LH: 35 m[IU]/mL

## 2023-11-22 LAB — ANDROSTENEDIONE: Androstenedione: 220 ng/dL

## 2023-11-22 LAB — FOLLICLE STIMULATING HORMONE: FSH: 61.5 m[IU]/mL

## 2023-11-22 LAB — CORTISOL: Cortisol, Plasma: 7.5 ug/dL

## 2023-11-25 ENCOUNTER — Other Ambulatory Visit: Payer: Self-pay | Admitting: Family

## 2023-11-25 DIAGNOSIS — R899 Unspecified abnormal finding in specimens from other organs, systems and tissues: Secondary | ICD-10-CM

## 2023-12-02 ENCOUNTER — Encounter: Payer: Self-pay | Admitting: Internal Medicine

## 2023-12-09 ENCOUNTER — Ambulatory Visit: Admitting: Infectious Diseases

## 2023-12-11 ENCOUNTER — Ambulatory Visit: Admitting: Infectious Diseases

## 2023-12-11 ENCOUNTER — Encounter: Payer: Self-pay | Admitting: Family

## 2023-12-11 ENCOUNTER — Other Ambulatory Visit: Payer: Self-pay | Admitting: Family

## 2023-12-11 DIAGNOSIS — R7989 Other specified abnormal findings of blood chemistry: Secondary | ICD-10-CM

## 2024-01-27 ENCOUNTER — Encounter: Payer: Self-pay | Admitting: Family

## 2024-02-02 ENCOUNTER — Encounter: Payer: Self-pay | Admitting: Internal Medicine

## 2024-02-17 ENCOUNTER — Other Ambulatory Visit

## 2024-02-17 ENCOUNTER — Encounter: Payer: Self-pay | Admitting: Internal Medicine

## 2024-02-17 ENCOUNTER — Ambulatory Visit: Admitting: Internal Medicine

## 2024-02-17 VITALS — BP 128/80 | HR 79 | Ht 65.5 in | Wt 151.0 lb

## 2024-02-17 DIAGNOSIS — E288 Other ovarian dysfunction: Secondary | ICD-10-CM | POA: Diagnosis not present

## 2024-02-17 MED ORDER — SPIRONOLACTONE 25 MG PO TABS: 25.0000 mg | ORAL_TABLET | Freq: Every day | ORAL | 2 refills | Status: DC

## 2024-02-17 MED ORDER — SPIRONOLACTONE 25 MG PO TABS: 25.0000 mg | ORAL_TABLET | Freq: Every day | ORAL | 2 refills | Status: AC

## 2024-02-17 NOTE — Progress Notes (Unsigned)
 "   Name: Lynn Peters  MRN/ DOB: [id number], [birth year]    Age/ Sex: 69 y.o., female    PCP: Dineen Rollene MATSU, FNP   Reason for Endocrinology Evaluation: Elevated testosterone      Date of Initial Endocrinology Evaluation: 11/12/2023    HPI: [name] is a 68 y.o. female with a past medical history of elevated testosterone , prediabetes. The patient presented for initial endocrinology clinic visit on 11/12/2023 for consultative assistance with her elevated testosterone .   Patient has been noted with elevated testosterone  in July, 2022 at 95 NG/DL, testosterone  max 149 on 10/21/2023 .  This was noted during evaluation for hirsutism.  It was believed that her erythrocytosis is related to elevated testosterone   Patient with low DHEA-S and  normal 17-hydroxyprogesterone in July, 2022 at 43 NG/DL TFTs have been normal  Abdominal CT in April, 2024 showed adrenals to be unremarkable, uterus not seen  Pelvic ultrasound showed normal-appearing ovaries bilaterally  Per hematology note she has been tested negative for JAK2 V617F mutations. She did have a sleep study in 2019 which showed no significant OSA  The patient has noted excessive hair growth under the chin for over 3 decades, this has been worsening in nature, no extra hair on chest/nipples No prior history of testosterone  use  Menarche age 65, patient has history of regular menstruations.  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 She is of [place] and black descent No family history of PCOS   She is not seeing a gynecologist anymore   NO testosterone  intake  Husband  is a musician   Bedtime 11pm-1 pm , sleeps 8-9 hrs a night, wakes up refreshed in the morning  No snoring  She has lifelong asthma and uses steroidal inhalers but no oral   LH and FSH elevated consistent with postmenopausal status, prolactin normal, with normal IGF-I, androstenedione , DHEA-S and  17-hydroxyprogesterone  Testosterone  remains elevated at 149 NG/DL  75-ynlm urinary cortisol normal 21.6 11/2023 SUBJECTIVE:    Today (02/17/2024): Lynn Peters is here for 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   She self diagnosed with gout since her last visit here that she attributes to elevated testosterone , first episode was ~ 2 weeks ago  She has joint pain of the hands   Patient has been noted with weight gain since her last visit here Palpitations - no  Hirsutism -worsening around the chin , plucks daily  Acne- no  Voice changes - no  Continues with GERD symptoms  She saw dermatology ~ 5 months ago for skin bumps which was diagnosed with keratitis   She has opted to try herbal in the beginning which caused constipation     HISTORY:  Past Medical History:  Past Medical History:  Diagnosis Date   Allergy    Anxiety    Arthritis    Asthma    Chest pain    Chicken pox    Complication of anesthesia    Difficulty breathing after GYN procedure   Diverticulitis 2009   GERD (gastroesophageal reflux disease)    Pneumonia    Past Surgical History:  Past Surgical History:  Procedure Laterality Date   ABDOMINAL HYSTERECTOMY  2004   partial; has cervix and ovaries per patient; done for fibroids, noncancerous   ANTERIOR CRUCIATE LIGAMENT REPAIR Right 2000   COLONOSCOPY WITH PROPOFOL  N/A 04/28/2019   Procedure: COLONOSCOPY WITH PROPOFOL ;  Surgeon: Unk Corinn Skiff, MD;  [facility]: [facility]  ENDOSCOPY;  Service: Gastroenterology;  Laterality: N/A;   ESOPHAGOGASTRODUODENOSCOPY (EGD) WITH PROPOFOL  N/A 04/28/2019   Procedure: ESOPHAGOGASTRODUODENOSCOPY (EGD) WITH PROPOFOL ;  Surgeon: Unk Corinn Skiff, MD;  Location: [facility] ENDOSCOPY;  Service: Gastroenterology;  Laterality: N/A;   LAPAROSCOPY     TOTAL KNEE ARTHROPLASTY Right 06/02/2020   Procedure: RIGHT TOTAL KNEE ARTHROPLASTY;  Surgeon: Vernetta Lonni GRADE, MD;  Location: [facility];  Service: Orthopedics;  Laterality: Right;  Needs RNFA    UTERINE FIBROID SURGERY      Social History:  reports that she has never smoked. She has been exposed to tobacco smoke. She has never used smokeless tobacco. She reports current alcohol use. She reports that she does not use drugs. Family History: family history includes Alcohol abuse in her brother; Alzheimer's disease in her mother; Asthma in her mother and sister; Diabetes in her father, mother, and sister; Heart disease in her brother and father; Hyperlipidemia in her father and mother; Hypertension in her mother; Stroke (age of onset: 53) in her mother.   HOME MEDICATIONS: Allergies as of 02/17/2024       Reactions   Pantoprazole  Sodium Shortness Of Breath   Tetracyclines & Related    Breo Ellipta [fluticasone  Furoate-vilanterol] Palpitations   Flagyl  [metronidazole ] Other (See Comments)   Neck pain, spasm        Medication List        Accurate as of February 17, 2024 11:13 AM. If you have any questions, ask your nurse or doctor.          albuterol  108 (90 Base) MCG/ACT inhaler Commonly known as: Ventolin  HFA INHALE 2 PUFFS INTO THE LUNGS EVERY 4 (FOUR) HOURS AS NEEDED FOR WHEEZING OR SHORTNESS OF BREATH.   cyanocobalamin 1000 MCG tablet Commonly known as: VITAMIN B12 Take 1,000 mcg by mouth daily.   famotidine  20 MG tablet Commonly known as: PEPCID  Take 1 tablet (20 mg total) by mouth daily.   fluticasone  44 MCG/ACT inhaler Commonly known as: Flovent  HFA Inhale 2 puffs into the lungs 2 (two) times daily.   GNP Biotin 2500 MCG Chew Generic drug: Biotin Chew 1 tablet by mouth daily at 6 (six) AM.   MAGNESIUM GLYCINATE PO Take 1 tablet by mouth daily at 6 (six) AM.   QC TUMERIC COMPLEX PO Take 450 mg by mouth daily at 6 (six) AM.   SOLUBLE FIBER/PROBIOTICS PO Take 1 capsule by mouth daily.   vitamin D3 50 MCG (2000 UT) Caps Take by mouth.   ZYRTEC CHILDRENS ALLERGY PO Take 5 mg by mouth at bedtime. liquid          REVIEW OF SYSTEMS: A  comprehensive ROS was conducted with the patient and is negative except as per HPI    OBJECTIVE:  VS: BP 128/80   Pulse 79   Ht 5' 5.5 (1.664 m)   Wt 151 lb (68.5 kg)   SpO2 99%   BMI 24.75 kg/m     Filed Weights   02/17/24 1106  Weight: 151 lb (68.5 kg)    EXAM: General: Pt appears well and is in NAD  Lungs: Clear with good BS bilat   Heart: Auscultation: RRR.  Abdomen: Soft, nontender  Extremities:  BL LE: No pretibial edema   Mental Status: Judgment, insight: Intact Orientation: Oriented to time, place, and person Mood and affect: No depression, anxiety, or agitation     DATA REVIEWED:  Latest Reference Range & Units 10/21/23 09:14 11/12/23 12:15  C206 ACTH  6 - 50 pg/mL  23  DHEA-SO4 9 - 118 mcg/dL  10  Cortisol, Plasma mcg/dL  7.5  LH mIU/mL  64.9  FSH mIU/mL  61.5  Prolactin ng/mL  6.7  ANDROSTENEDIONE ng/dL  779  Estradiol  pg/mL  40  Free Testosterone  0.1 - 6.4 pg/mL 20 (H)   Testosterone , Total, LC-MS-MS 2 - 45 ng/dL 850 (H)   82-NY-Emnhzduzmnwz, LC/MS/MS  ng/dL  68      Old records , labs and images have been reviewed.    ASSESSMENT/PLAN/RECOMMENDATIONS:   Hyperandrogenism:  -The patient has no clinical symptoms of hyperandrogenism or virilization except for limited hirsutism around the chin area.  She has a self diagnosis of gout?  That she attributed to high testosterone , she also has migraine headaches that she is trying to link to a high testosterone .  I did explain to the patient that this should be treated separately and I am not sure how much of that is due to testosterone  - Patient with erythrocytosis that has been attributed to elevated testosterone , in reviewing CBC over the past few years, though H/H and RBCs have been fluctuating between normal to high - In the past she has had normal adrenal and ovarian imaging - Patient has had normal 24-hour urinary cortisol, ACTH , serum cortisol, IGF-I, androstenedione , 17-hydroxyprogesterone,  prolactin - I did recommend spironolactone , I did explain to the patient this is diuretic, we did discuss the risk of dehydration, dizziness, and hyperkalemia  -I did encourage the patient to avoid any weight gain as that could also increase testosterone  levels - If spironolactone  does not help with appropriate dose, as the patient opted to start a small dose, we will consider GnRH agonist testing  Medication  Start spironolactone  25 mg daily   Signed electronically by: Stefano Redgie Butts, MD  [facility] Endocrinology  [facility] [address] [place] [place], [place] [address] Phone: [phone] FAX: [phone]   CC: Dineen Rollene MATSU, FNP [address] [place] [address] Phone: [phone] Fax: [phone]   Return to Endocrinology clinic as below: Future Appointments  Date Time Provider Department Center  02/17/2024 11:30 AM Matt Delpizzo, Donell Redgie, MD [facility] None  02/24/2024  2:40 PM Darron Deatrice LABOR, MD [facility] None  04/13/2024  3:15 PM Jackquline Sawyer, MD [facility] None         "

## 2024-02-18 LAB — SEX HORMONE BINDING GLOBULIN: Sex Hormone Binding: 47 nmol/L (ref 14–73)

## 2024-02-19 ENCOUNTER — Encounter: Payer: Self-pay | Admitting: Family

## 2024-02-20 ENCOUNTER — Ambulatory Visit: Payer: Self-pay | Admitting: Internal Medicine

## 2024-02-20 LAB — TESTOSTERONE, TOTAL, LC/MS/MS: Testosterone, Total, LC-MS-MS: 128 ng/dL — ABNORMAL HIGH (ref 2–45)

## 2024-02-23 ENCOUNTER — Ambulatory Visit: Admitting: Family

## 2024-02-24 ENCOUNTER — Ambulatory Visit: Attending: Cardiovascular Disease | Admitting: Cardiovascular Disease

## 2024-02-24 VITALS — BP 138/70 | HR 65 | Ht 65.0 in | Wt 150.0 lb

## 2024-02-24 DIAGNOSIS — E785 Hyperlipidemia, unspecified: Secondary | ICD-10-CM | POA: Diagnosis not present

## 2024-02-24 DIAGNOSIS — I493 Ventricular premature depolarization: Secondary | ICD-10-CM | POA: Diagnosis not present

## 2024-02-24 NOTE — Patient Instructions (Signed)
 Medication Instructions:   Your physician recommends that you continue on your current medications as directed. Please refer to the Current Medication list given to you today.    *If you need a refill on your cardiac medications before your next appointment, please call your pharmacy*  Lab Work:  None ordered at this time   If you have labs (blood work) drawn today and your tests are completely normal, you will receive your results only by:  MyChart Message (if you have MyChart) OR  A paper copy in the mail If you have any lab test that is abnormal or we need to change your treatment, we will call you to review the results.  Testing/Procedures:  None ordered at this time   Referrals:  None ordered at this time   Follow-Up:  At [facility], you and your health needs are our priority.  As part of our continuing mission to provide you with exceptional heart care, our providers are all part of one team.  This team includes your primary Cardiologist (physician) and Advanced Practice Providers or APPs (Physician Assistants and Nurse Practitioners) who all work together to provide you with the care you need, when you need it.  Your next appointment:   1 year(s)  Provider:    You may see Dr. Darron or one of the following Advanced Practice Providers on your designated Care Team:   Lonni Meager, NP Lesley Maffucci, PA-C Bernardino Bring, PA-C Cadence Terral, PA-C Tylene Lunch, NP Barnie Hila, NP    We recommend signing up for the patient portal called MyChart.  Sign up information is provided on this After Visit Summary.  MyChart is used to connect with patients for Virtual Visits (Telemedicine).  Patients are able to view lab/test results, encounter notes, upcoming appointments, etc.  Non-urgent messages can be sent to your provider as well.   To learn more about what you can do with MyChart, go to [email or web address].

## 2024-02-24 NOTE — Progress Notes (Signed)
 "    Cardiology Office Note   Date:  02/24/2024   ID:  Lynn Peters, DOB December 08, 1955, MRN [id number]  PCP:  Dineen Rollene MATSU, FNP  Cardiologist:   Deatrice Cage, MD   Chief Complaint  Patient presents with   Follow-up      History of Present Illness: Lynn Peters is a 69 y.o. female who is here today for a follow-up visit regarding palpitations and short runs of SVT and premature beats.   She had carotid Doppler done in April 2024 which showed minimal eccentric plaque involving the right carotid artery.  Echocardiogram and May 2024 showed normal LV systolic function with no significant valvular abnormalities.  Previous CT imaging did show mild aortic atherosclerosis. CT calcium score in July 2025 was 0.  There was mention of small pericardial effusion but it was very small overall. She has been doing very well with no chest pain, shortness of breath or palpitations.  She has been dealing with elevated testosterone  level and this is managed by endocrinology.   Past Medical History:  Diagnosis Date   Allergy    Anxiety    Arthritis    Asthma    Chest pain    Chicken pox    Complication of anesthesia    Difficulty breathing after GYN procedure   Diverticulitis 2009   GERD (gastroesophageal reflux disease)    Pneumonia     Past Surgical History:  Procedure Laterality Date   ABDOMINAL HYSTERECTOMY  2004   partial; has cervix and ovaries per patient; done for fibroids, noncancerous   ANTERIOR CRUCIATE LIGAMENT REPAIR Right 2000   COLONOSCOPY WITH PROPOFOL  N/A 04/28/2019   Procedure: COLONOSCOPY WITH PROPOFOL ;  Surgeon: Unk Corinn Skiff, MD;  Location: [facility];  Service: Gastroenterology;  Laterality: N/A;   ESOPHAGOGASTRODUODENOSCOPY (EGD) WITH PROPOFOL  N/A 04/28/2019   Procedure: ESOPHAGOGASTRODUODENOSCOPY (EGD) WITH PROPOFOL ;  Surgeon: Unk Corinn Skiff, MD;  Location: [facility];  Service: Gastroenterology;  Laterality: N/A;   LAPAROSCOPY     TOTAL KNEE  ARTHROPLASTY Right 06/02/2020   Procedure: RIGHT TOTAL KNEE ARTHROPLASTY;  Surgeon: Vernetta Lonni GRADE, MD;  Location: [facility];  Service: Orthopedics;  Laterality: Right;  Needs RNFA   UTERINE FIBROID SURGERY       Current Outpatient Medications  Medication Sig Dispense Refill   albuterol  (VENTOLIN  HFA) 108 (90 Base) MCG/ACT inhaler INHALE 2 PUFFS INTO THE LUNGS EVERY 4 (FOUR) HOURS AS NEEDED FOR WHEEZING OR SHORTNESS OF BREATH. 18 g 2   Biotin (GNP BIOTIN) 2500 MCG CHEW Chew 1 tablet by mouth daily at 6 (six) AM.     Cetirizine HCl (ZYRTEC CHILDRENS ALLERGY PO) Take 5 mg by mouth at bedtime. liquid     Cholecalciferol (VITAMIN D3) 50 MCG (2000 UT) CAPS Take by mouth.     famotidine  (PEPCID ) 20 MG tablet Take 1 tablet (20 mg total) by mouth daily. 30 tablet 2   fluticasone  (FLOVENT  HFA) 44 MCG/ACT inhaler Inhale 2 puffs into the lungs 2 (two) times daily. 10.6 g 3   MAGNESIUM GLYCINATE PO Take 1 tablet by mouth daily at 6 (six) AM.     Probiotic Product (SOLUBLE FIBER/PROBIOTICS PO) Take 1 capsule by mouth daily.     Turmeric (QC TUMERIC COMPLEX PO) Take 450 mg by mouth daily at 6 (six) AM.     vitamin B-12 (CYANOCOBALAMIN) 1000 MCG tablet Take 1,000 mcg by mouth daily.     spironolactone  (ALDACTONE ) 25 MG tablet Take 1 tablet (25 mg total) by mouth  daily. (Patient not taking: Reported on 02/24/2024) 90 tablet 2   No current facility-administered medications for this visit.    Allergies:   Pantoprazole  sodium, Tetracyclines & related, Breo ellipta [fluticasone  furoate-vilanterol], and Flagyl  [metronidazole ]    Social History:  The patient  reports that she has never smoked. She has been exposed to tobacco smoke. She has never used smokeless tobacco. She reports current alcohol use. She reports that she does not use drugs.   Family History:  The patient's family history includes Alcohol abuse in her brother; Alzheimer's disease in her mother; Asthma in her mother and sister; Diabetes in  her father, mother, and sister; Heart disease in her brother and father; Hyperlipidemia in her father and mother; Hypertension in her mother; Stroke (age of onset: 59) in her mother.    ROS:  Please see the history of present illness.   Otherwise, review of systems are positive for none.   All other systems are reviewed and negative.    PHYSICAL EXAM: VS:  BP 138/70 (BP Location: Left Arm, Patient Position: Sitting, Cuff Size: Normal)   Pulse 65   Ht 5' 5 (1.651 m)   Wt 150 lb (68 kg)   SpO2 100%   BMI 24.96 kg/m  , BMI Body mass index is 24.96 kg/m. GEN: Well nourished, well developed, in no acute distress  HEENT: normal  Neck: no JVD, carotid bruits, or masses Cardiac: RRR; no murmurs, rubs, or gallops,no edema  Respiratory:  clear to auscultation bilaterally, normal work of breathing GI: soft, nontender, nondistended, + BS MS: no deformity or atrophy  Skin: warm and dry, no rash Neuro:  Strength and sensation are intact Psych: euthymic mood, full affect   EKG:  EKG is ordered today. EKG showed: Normal sinus rhythm Possible Anterior infarct (cited on or before 13-Mar-2021) When compared with ECG of 14-May-2022 15:26, No significant change was found    Recent Labs: 08/25/2023: ALT 13; BUN 14; Creatinine, Ser 0.86; Potassium 4.2; Sodium 137; TSH 1.96 10/21/2023: Hemoglobin 15.1; Platelets 279.0    Lipid Panel    Component Value Date/Time   CHOL 171 08/25/2023 1110   CHOL 185 03/19/2021 0859   TRIG 89.0 08/25/2023 1110   HDL 52.70 08/25/2023 1110   HDL 56 03/19/2021 0859   CHOLHDL 3 08/25/2023 1110   VLDL 17.8 08/25/2023 1110   LDLCALC 101 (H) 08/25/2023 1110   LDLCALC 113 (H) 03/19/2021 0859      Wt Readings from Last 3 Encounters:  02/24/24 150 lb (68 kg)  02/17/24 151 lb (68.5 kg)  11/12/23 147 lb (66.7 kg)           No data to display            ASSESSMENT AND PLAN:  1.  Palpitations: No recurrent symptoms overall.  2.  Hyperlipidemia: Most  recent lipid profile showed improvement in LDL to 101.  CT calcium score was 0.  No indication for a statin at this time.  3.  Small pericardial effusion on CT: This was reviewed by me and it appeared very small and of no clinical significance.    Disposition:   FU with me in 1 year  Signed,  Deatrice Cage, MD  02/24/2024 2:55 PM    [facility] "

## 2024-03-08 ENCOUNTER — Ambulatory Visit: Admitting: Family

## 2024-03-17 ENCOUNTER — Encounter: Payer: Self-pay | Admitting: Family

## 2024-03-17 ENCOUNTER — Ambulatory Visit

## 2024-03-17 DIAGNOSIS — Z1231 Encounter for screening mammogram for malignant neoplasm of breast: Secondary | ICD-10-CM

## 2024-04-05 ENCOUNTER — Encounter

## 2024-04-13 ENCOUNTER — Ambulatory Visit: Admitting: Dermatology

## 2024-05-19 ENCOUNTER — Ambulatory Visit: Admitting: Internal Medicine

## 2024-06-01 ENCOUNTER — Ambulatory Visit
